# Patient Record
Sex: Male | Born: 1970 | Hispanic: No | Smoking: Never smoker
Health system: Southern US, Community
[De-identification: ages and names within clinical notes are randomized; demographics above are authoritative.]

---

## 2019-04-08 ENCOUNTER — Emergency Department (HOSPITAL_COMMUNITY): Payer: BC Managed Care – PPO

## 2019-04-08 ENCOUNTER — Other Ambulatory Visit: Payer: Self-pay

## 2019-04-08 ENCOUNTER — Encounter (HOSPITAL_COMMUNITY): Payer: Self-pay

## 2019-04-08 ENCOUNTER — Inpatient Hospital Stay (HOSPITAL_COMMUNITY)
Admission: EM | Admit: 2019-04-08 | Discharge: 2019-04-30 | DRG: 207 | Disposition: A | Discharge status: Home Health | Payer: BC Managed Care – PPO | Attending: Internal Medicine | Admitting: Internal Medicine

## 2019-04-08 DIAGNOSIS — E870 Hyperosmolality and hypernatremia: Secondary | ICD-10-CM | POA: Diagnosis not present

## 2019-04-08 DIAGNOSIS — R001 Bradycardia, unspecified: Secondary | ICD-10-CM | POA: Diagnosis present

## 2019-04-08 DIAGNOSIS — J80 Acute respiratory distress syndrome: Secondary | ICD-10-CM | POA: Diagnosis not present

## 2019-04-08 DIAGNOSIS — Z4659 Encounter for fitting and adjustment of other gastrointestinal appliance and device: Secondary | ICD-10-CM

## 2019-04-08 DIAGNOSIS — R739 Hyperglycemia, unspecified: Secondary | ICD-10-CM | POA: Diagnosis present

## 2019-04-08 DIAGNOSIS — U071 COVID-19: Secondary | ICD-10-CM

## 2019-04-08 DIAGNOSIS — J069 Acute upper respiratory infection, unspecified: Secondary | ICD-10-CM

## 2019-04-08 DIAGNOSIS — J988 Other specified respiratory disorders: Secondary | ICD-10-CM | POA: Diagnosis not present

## 2019-04-08 DIAGNOSIS — Z9289 Personal history of other medical treatment: Secondary | ICD-10-CM

## 2019-04-08 DIAGNOSIS — R339 Retention of urine, unspecified: Secondary | ICD-10-CM | POA: Diagnosis not present

## 2019-04-08 DIAGNOSIS — J9601 Acute respiratory failure with hypoxia: Secondary | ICD-10-CM | POA: Diagnosis not present

## 2019-04-08 DIAGNOSIS — J1289 Other viral pneumonia: Secondary | ICD-10-CM | POA: Diagnosis present

## 2019-04-08 DIAGNOSIS — I480 Paroxysmal atrial fibrillation: Secondary | ICD-10-CM | POA: Diagnosis present

## 2019-04-08 DIAGNOSIS — J1282 Pneumonia due to coronavirus disease 2019: Secondary | ICD-10-CM

## 2019-04-08 DIAGNOSIS — R740 Nonspecific elevation of levels of transaminase and lactic acid dehydrogenase [LDH]: Secondary | ICD-10-CM | POA: Diagnosis present

## 2019-04-08 DIAGNOSIS — E876 Hypokalemia: Secondary | ICD-10-CM | POA: Diagnosis present

## 2019-04-08 DIAGNOSIS — I4891 Unspecified atrial fibrillation: Secondary | ICD-10-CM | POA: Diagnosis not present

## 2019-04-08 DIAGNOSIS — R945 Abnormal results of liver function studies: Secondary | ICD-10-CM | POA: Diagnosis present

## 2019-04-08 DIAGNOSIS — E871 Hypo-osmolality and hyponatremia: Secondary | ICD-10-CM | POA: Diagnosis not present

## 2019-04-08 DIAGNOSIS — J9621 Acute and chronic respiratory failure with hypoxia: Secondary | ICD-10-CM | POA: Diagnosis not present

## 2019-04-08 DIAGNOSIS — I4819 Other persistent atrial fibrillation: Secondary | ICD-10-CM | POA: Diagnosis not present

## 2019-04-08 DIAGNOSIS — Z978 Presence of other specified devices: Secondary | ICD-10-CM | POA: Diagnosis not present

## 2019-04-08 HISTORY — DX: COVID-19: U07.1

## 2019-04-08 LAB — COMPREHENSIVE METABOLIC PANEL
ALT: 34 U/L (ref 0–44)
AST: 69 U/L — ABNORMAL HIGH (ref 15–41)
Albumin: 3.3 g/dL — ABNORMAL LOW (ref 3.5–5.0)
Alkaline Phosphatase: 59 U/L (ref 38–126)
Anion gap: 11 (ref 5–15)
BUN: 14 mg/dL (ref 6–20)
CO2: 28 mmol/L (ref 22–32)
Calcium: 8.9 mg/dL (ref 8.9–10.3)
Chloride: 102 mmol/L (ref 98–111)
Creatinine, Ser: 1.42 mg/dL — ABNORMAL HIGH (ref 0.61–1.24)
GFR calc Af Amer: >60 mL/min (ref >60)
GFR calc non Af Amer: 57 mL/min — ABNORMAL LOW (ref >60)
Glucose, Bld: 129 mg/dL — ABNORMAL HIGH (ref 70–99)
Potassium: 3.4 mmol/L — ABNORMAL LOW (ref 3.5–5.1)
Sodium: 141 mmol/L (ref 135–145)
Total Bilirubin: 0.6 mg/dL (ref 0.3–1.2)
Total Protein: 7.6 g/dL (ref 6.5–8.1)

## 2019-04-08 LAB — FIBRINOGEN: Fibrinogen: 628 mg/dL — ABNORMAL HIGH (ref 210–475)

## 2019-04-08 LAB — CBC
HCT: 46.6 % (ref 39.0–52.0)
Hemoglobin: 15.1 g/dL (ref 13.0–17.0)
MCH: 26.5 pg (ref 26.0–34.0)
MCHC: 32.4 g/dL (ref 30.0–36.0)
MCV: 81.9 fL (ref 80.0–100.0)
Platelets: 87 10*3/uL — ABNORMAL LOW (ref 150–400)
RBC: 5.69 MIL/uL (ref 4.22–5.81)
RDW: 13.7 % (ref 11.5–15.5)
WBC: 7.3 10*3/uL (ref 4.0–10.5)
nRBC: 0 % (ref 0.0–0.2)

## 2019-04-08 LAB — LACTATE DEHYDROGENASE: LDH: 349 U/L — ABNORMAL HIGH (ref 98–192)

## 2019-04-08 LAB — SARS CORONAVIRUS 2 BY RT PCR (HOSPITAL ORDER, PERFORMED IN ~~LOC~~ HOSPITAL LAB): SARS Coronavirus 2: POSITIVE — AB

## 2019-04-08 LAB — BRAIN NATRIURETIC PEPTIDE: B Natriuretic Peptide: 25.4 pg/mL (ref 0.0–100.0)

## 2019-04-08 LAB — PROCALCITONIN: Procalcitonin: 1.9 ng/mL

## 2019-04-08 LAB — LIPASE, BLOOD: Lipase: 21 U/L (ref 11–51)

## 2019-04-08 LAB — C-REACTIVE PROTEIN: CRP: 11.8 mg/dL — ABNORMAL HIGH (ref <1.0)

## 2019-04-08 LAB — LACTIC ACID, PLASMA: Lactic Acid, Venous: 1.1 mmol/L (ref 0.5–1.9)

## 2019-04-08 LAB — D-DIMER, QUANTITATIVE: D-Dimer, Quant: 0.57 ug/mL-FEU — ABNORMAL HIGH (ref 0.00–0.50)

## 2019-04-08 LAB — FERRITIN: Ferritin: 805 ng/mL — ABNORMAL HIGH (ref 24–336)

## 2019-04-08 MED ORDER — ONDANSETRON HCL 4 MG/2ML IJ SOLN
4.0000 mg | Freq: Once | INTRAMUSCULAR | Status: AC
  Administered 2019-04-08: 4 mg via INTRAVENOUS
  Filled 2019-04-08: qty 2

## 2019-04-08 MED ORDER — ONDANSETRON HCL 4 MG/2ML IJ SOLN: 4.0000 mg | Freq: Four times a day (QID) | INTRAMUSCULAR | Status: DC | PRN

## 2019-04-08 MED ORDER — SODIUM CHLORIDE 0.9% FLUSH
3.0000 mL | Freq: Two times a day (BID) | INTRAVENOUS | Status: DC
  Administered 2019-04-09 – 2019-04-10 (×3): 3 mL via INTRAVENOUS
  Administered 2019-04-11: 10 mL via INTRAVENOUS
  Administered 2019-04-11: 16:00:00 via INTRAVENOUS
  Administered 2019-04-12 – 2019-04-30 (×36): 3 mL via INTRAVENOUS

## 2019-04-08 MED ORDER — ENSURE ENLIVE PO LIQD
237.0000 mL | Freq: Two times a day (BID) | ORAL | Status: DC
  Administered 2019-04-09: 237 mL via ORAL
  Filled 2019-04-08 (×3): qty 237

## 2019-04-08 MED ORDER — BISACODYL 5 MG PO TBEC: 5.0000 mg | DELAYED_RELEASE_TABLET | Freq: Every day | ORAL | Status: DC | PRN

## 2019-04-08 MED ORDER — ACETAMINOPHEN 325 MG PO TABS
650.0000 mg | ORAL_TABLET | Freq: Four times a day (QID) | ORAL | Status: DC | PRN
  Administered 2019-04-08 – 2019-04-10 (×5): 650 mg via ORAL
  Filled 2019-04-08 (×6): qty 2

## 2019-04-08 MED ORDER — SODIUM CHLORIDE 0.9 % IV BOLUS
1000.0000 mL | Freq: Once | INTRAVENOUS | Status: AC
  Administered 2019-04-08: 1000 mL via INTRAVENOUS

## 2019-04-08 MED ORDER — ALBUTEROL SULFATE HFA 108 (90 BASE) MCG/ACT IN AERS
4.0000 | INHALATION_SPRAY | Freq: Once | RESPIRATORY_TRACT | Status: AC
  Administered 2019-04-08: 4 via RESPIRATORY_TRACT
  Filled 2019-04-08: qty 6.7

## 2019-04-08 MED ORDER — POLYETHYLENE GLYCOL 3350 17 G PO PACK: 17.0000 g | PACK | Freq: Every day | ORAL | Status: DC | PRN

## 2019-04-08 MED ORDER — ACETAMINOPHEN 325 MG PO TABS: 650.0000 mg | ORAL_TABLET | Freq: Once | ORAL | Status: DC

## 2019-04-08 MED ORDER — SODIUM CHLORIDE 0.9 % IV SOLN
500.0000 mg | Freq: Once | INTRAVENOUS | Status: DC
  Filled 2019-04-08: qty 500

## 2019-04-08 MED ORDER — ACETAMINOPHEN 325 MG PO TABS
650.0000 mg | ORAL_TABLET | Freq: Once | ORAL | Status: AC
  Administered 2019-04-08: 650 mg via ORAL
  Filled 2019-04-08: qty 2

## 2019-04-08 MED ORDER — OXYCODONE HCL 5 MG PO TABS
5.0000 mg | ORAL_TABLET | ORAL | Status: DC | PRN
  Administered 2019-04-16: 05:00:00 5 mg via ORAL
  Filled 2019-04-08: qty 1

## 2019-04-08 MED ORDER — SODIUM CHLORIDE 0.9 % IV SOLN
1.0000 g | Freq: Once | INTRAVENOUS | Status: AC
  Administered 2019-04-08: 13:00:00 1 g via INTRAVENOUS
  Filled 2019-04-08: qty 10

## 2019-04-08 MED ORDER — ONDANSETRON HCL 4 MG PO TABS: 4.0000 mg | ORAL_TABLET | Freq: Four times a day (QID) | ORAL | Status: DC | PRN

## 2019-04-08 MED ORDER — DOCUSATE SODIUM 100 MG PO CAPS
100.0000 mg | ORAL_CAPSULE | Freq: Two times a day (BID) | ORAL | Status: DC
  Administered 2019-04-08 – 2019-04-10 (×4): 100 mg via ORAL
  Filled 2019-04-08 (×4): qty 1

## 2019-04-08 MED ORDER — ENOXAPARIN SODIUM 40 MG/0.4ML ~~LOC~~ SOLN
40.0000 mg | SUBCUTANEOUS | Status: DC
  Administered 2019-04-08 – 2019-04-10 (×3): 40 mg via SUBCUTANEOUS
  Filled 2019-04-08 (×3): qty 0.4

## 2019-04-08 NOTE — ED Notes (Signed)
Attempted report. Staff notified that the pt is on the way and is being transported by [facility]. RN will call back.

## 2019-04-08 NOTE — ED Notes (Signed)
Speaking with interpretor , pt states there are 8 other people in the home that have been sick. Spoke with daughter on phone-- speaks English-- explained the necessity of staying home and self quarantining -- states that her sister has gone to work. Explained that she needed to stay home too.

## 2019-04-08 NOTE — ED Notes (Signed)
ALl information gathered from patient using vietnamese interpreter.

## 2019-04-08 NOTE — ED Notes (Signed)
ED TO INPATIENT HANDOFF REPORT  ED Nurse Name and Phone #:  Patty [phone]  S Name/Age/Gender Melvin Mills 48 y.o. male Room/Bed: 033C/033C  Code Status   Code Status: Full Code  Home/SNF/Other Home Patient oriented to: self, place, time and situation Is this baseline? Yes   Triage Complete: Triage complete  Chief Complaint Weakness  Triage Note Pt endorses chills, bodyaches, n/v, fatigue, cough x 3 days. Another family member in the house has the same. 99.3 oral temp. VSS   Allergies No Known Allergies  Level of Care/Admitting Diagnosis ED Disposition    ED Disposition Condition Comment   Admit  [facility] [[id number]]  Level of Care: Telemetry [5]  Covid Evaluation: Confirmed COVID Positive  Isolation Risk Level: Low Risk/Droplet (Less than 4L [place] supplementation)  Diagnosis: Acute respiratory disease due to COVID-19 virus [[id number]][[phone]]  Admitting Physician: Jonah BlueYATES, JENNIFER [[id number]]  Attending Physician: Jonah BlueYATES, JENNIFER [[id number]]  Estimated length of stay: past midnight tomorrow  Certification:: I certify this patient will need inpatient services for at least 2 midnights  PT Class (Do Not Modify): Inpatient [101]  PT Acc Code (Do Not Modify): Private [1]       B Medical/Surgery History History reviewed. No pertinent past medical history. History reviewed. No pertinent surgical history.   A IV Location/Drains/Wounds Patient Lines/Drains/Airways Status   Active Line/Drains/Airways    Name:   Placement date:   Placement time:   Site:   Days:   Peripheral IV 04/08/19 Left Antecubital   04/08/19    -    Antecubital   less than 1          Intake/Output Last 24 hours  Intake/Output Summary (Last 24 hours) at 04/08/2019 1556 Last data filed at 04/08/2019 1449 Gross per 24 hour  Intake 1100 ml  Output -  Net 1100 ml    Labs/Imaging Results for orders placed or performed during the hospital encounter of 04/08/19 (from the past 48  hour(s))  Lipase, blood     Status: None   Collection Time: 04/08/19  9:26 AM  Result Value Ref Range   Lipase 21 11 - 51 U/L    Comment: Performed at [facility], [address] [place], [place] [address]  Comprehensive metabolic panel     Status: Abnormal   Collection Time: 04/08/19  9:26 AM  Result Value Ref Range   Sodium 141 135 - 145 mmol/L   Potassium 3.4 (L) 3.5 - 5.1 mmol/L   Chloride 102 98 - 111 mmol/L   CO2 28 22 - 32 mmol/L   Glucose, Bld 129 (H) 70 - 99 mg/dL   BUN 14 6 - 20 mg/dL   Creatinine, Ser 2.531.42 (H) 0.61 - 1.24 mg/dL   Calcium 8.9 8.9 - 66.410.3 mg/dL   Total Protein 7.6 6.5 - 8.1 g/dL   Albumin 3.3 (L) 3.5 - 5.0 g/dL   AST 69 (H) 15 - 41 U/L   ALT 34 0 - 44 U/L   Alkaline Phosphatase 59 38 - 126 U/L   Total Bilirubin 0.6 0.3 - 1.2 mg/dL   GFR calc non Af Amer 57 (L) >60 mL/min   GFR calc Af Amer >60 >60 mL/min   Anion gap 11 5 - 15    Comment: Performed at [facility], [address] [place], [place] [address]  CBC     Status: Abnormal   Collection Time: 04/08/19  9:26 AM  Result Value Ref Range  WBC 7.3 4.0 - 10.5 K/uL   RBC 5.69 4.22 - 5.81 MIL/uL   Hemoglobin 15.1 13.0 - 17.0 g/dL   HCT 81.1 91.4 - 78.2 %   MCV 81.9 80.0 - 100.0 fL   MCH 26.5 26.0 - 34.0 pg   MCHC 32.4 30.0 - 36.0 g/dL   RDW 95.6 21.3 - 08.6 %   Platelets 87 (L) 150 - 400 K/uL    Comment: REPEATED TO VERIFY PLATELET COUNT CONFIRMED BY SMEAR SPECIMEN CHECKED FOR CLOTS Immature Platelet Fraction may be clinically indicated, consider ordering this additional test [id number]    nRBC 0.0 0.0 - 0.2 %    Comment: Performed at [facility], [address], [place], [place] [address]  SARS Coronavirus 2 (CEPHEID- Performed in [facility] hospital lab), Hosp Order     Status: Abnormal   Collection Time: 04/08/19 10:23 AM  Result Value Ref Range   SARS Coronavirus 2 POSITIVE (A) NEGATIVE    Comment: RESULT CALLED TO, READ BACK BY AND VERIFIED WITH: Duanne Guess RN  12:10 04/08/19 ([email or web address]) (NOTE) If result is NEGATIVE SARS-CoV-2 target nucleic acids are NOT DETECTED. The SARS-CoV-2 RNA is generally detectable in upper and lower  respiratory specimens during the acute phase of infection. The lowest  concentration of SARS-CoV-2 viral copies this assay can detect is 250  copies / mL. A negative result does not preclude SARS-CoV-2 infection  and should not be used as the sole basis for treatment or other  patient management decisions.  A negative result may occur with  improper specimen collection / handling, submission of specimen other  than nasopharyngeal swab, presence of viral mutation(s) within the  areas targeted by this assay, and inadequate number of viral copies  (<250 copies / mL). A negative result must be combined with clinical  observations, patient history, and epidemiological information. If result is POSITIVE SARS-CoV-2 target nucleic acids are DETECTED.  The SARS-CoV-2 RNA is generally detectable in upper and lower  respiratory specimens during the acute phase of infection.  Positive  results are indicative of active infection with SARS-CoV-2.  Clinical  correlation with patient history and other diagnostic information is  necessary to determine patient infection status.  Positive results do  not rule out bacterial infection or co-infection with other viruses. If result is PRESUMPTIVE POSTIVE SARS-CoV-2 nucleic acids MAY BE PRESENT.   A presumptive positive result was obtained on the submitted specimen  and confirmed on repeat testing.  While 2019 novel coronavirus  (SARS-CoV-2) nucleic acids may be present in the submitted sample  additional confirmatory testing may be necessary for epidemiological  and / or clinical management purposes  to differentiate between  SARS-CoV-2 and other Sarbecovirus currently known to infect humans.  If clinically indicated additional testing with an alternate test  methodology [phone])  is  advised. The SARS-CoV-2 RNA is generally  detectable in upper and lower respiratory specimens during the acute  phase of infection. The expected result is Negative. Fact Sheet for Patients:  BoilerBrush.com.cy Fact Sheet for Healthcare Providers: https://pope.com/ This test is not yet approved or cleared by the [place] FDA and has been authorized for detection and/or diagnosis of SARS-CoV-2 by FDA under an Emergency Use Authorization (EUA).  This EUA will remain in effect (meaning this test can be used) for the duration of the COVID-19 declaration under Section 564(b)(1) of the Act, 21 U.S.C. section 360bbb-3(b)(1), unless the authorization is terminated or revoked sooner. Performed at [facility], [address], [place], [place] [address]   Lactic acid, plasma     Status: None   Collection Time: 04/08/19 12:35 PM  Result Value Ref Range   Lactic Acid, Venous 1.1 0.5 - 1.9 mmol/L    Comment: Performed at [facility], [address] [place], [place] [address]   Dg Chest Portable 1 View  Result Date: 04/08/2019 CLINICAL DATA:  Cough, weakness EXAM: PORTABLE CHEST 1 VIEW COMPARISON:  None. FINDINGS: Heart is borderline in size. There are patchy bilateral airspace opacities noted, left greater than right. No effusions. No acute bony abnormality. IMPRESSION: Patchy bilateral airspace disease, left greater than right. Findings concerning for pneumonia. Atypical/viral pneumonia is possible. Electronically Signed   By: Charlett Nose M.D.   On: 04/08/2019 09:56    Pending Labs Unresulted Labs (From admission, onward)    Start     Ordered   04/09/19 0500  CBC with Differential/Platelet  Daily,   R     04/08/19 1526   04/09/19 0500  Comprehensive metabolic panel  Daily,   R     04/08/19 1526   04/09/19 0500  C-reactive protein  Daily,   R     04/08/19 1526   04/09/19 0500  D-dimer, quantitative (not at [facility])  Daily,   R      04/08/19 1526   04/09/19 0500  Ferritin  Daily,   R     04/08/19 1526   04/08/19 1527  Brain natriuretic peptide  Once,   R     04/08/19 1526   04/08/19 1527  C-reactive protein  Once,   R     04/08/19 1526   04/08/19 1527  D-dimer, quantitative (not at [facility])  Once,   R     04/08/19 1526   04/08/19 1527  Ferritin  Once,   R     04/08/19 1526   04/08/19 1527  Fibrinogen  Once,   R     04/08/19 1526   04/08/19 1527  Procalcitonin  Once,   R     04/08/19 1526   04/08/19 1527  Lactate dehydrogenase  Once,   R     04/08/19 1526   04/08/19 1526  HIV antibody (Routine Testing)  Once,   R     04/08/19 1526   04/08/19 1235  Lactic acid, plasma  Now then every 2 hours,   STAT     04/08/19 1234   04/08/19 1115  Blood culture (routine x 2)  BLOOD CULTURE X 2,   STAT     04/08/19 1114          Vitals/Pain Today's Vitals   04/08/19 1045 04/08/19 1100 04/08/19 1115 04/08/19 1336  BP: 122/86 120/84 114/85 (!) 137/96  Pulse: 78 78 74 81  Resp: (!) 29 18 (!) 27 (!) 26  Temp:    (S) 100.3 F (37.9 C)  TempSrc:      SpO2: 96% 95% 96% 99%  PainSc:        Isolation Precautions Droplet and Contact precautions  Medications Medications  acetaminophen (TYLENOL) tablet 650 mg (has no administration in time range)  enoxaparin (LOVENOX) injection 40 mg (has no administration in time range)  acetaminophen (TYLENOL) tablet 650 mg (has no administration in time range)  oxyCODONE (Oxy IR/ROXICODONE) immediate release tablet 5 mg (has no administration in time range)  docusate sodium (COLACE) capsule 100 mg (has no administration in time range)  polyethylene glycol (MIRALAX / GLYCOLAX) packet 17 g (has no  administration in time range)  bisacodyl (DULCOLAX) EC tablet 5 mg (has no administration in time range)  ondansetron (ZOFRAN) tablet 4 mg (has no administration in time range)    Or  ondansetron (ZOFRAN) injection 4 mg (has no administration in time range)  sodium chloride flush (NS) 0.9 %  injection 3 mL (has no administration in time range)  acetaminophen (TYLENOL) tablet 650 mg (650 mg Oral Given 04/08/19 1000)  sodium chloride 0.9 % bolus 1,000 mL (0 mLs Intravenous Stopped 04/08/19 1056)  albuterol (VENTOLIN HFA) 108 (90 Base) MCG/ACT inhaler 4 puff (4 puffs Inhalation Given 04/08/19 1000)  ondansetron (ZOFRAN) injection 4 mg (4 mg Intravenous Given 04/08/19 0959)  cefTRIAXone (ROCEPHIN) 1 g in sodium chloride 0.9 % 100 mL IVPB (0 g Intravenous Stopped 04/08/19 1449)    Mobility walks Low fall risk   Focused Assessments    R Recommendations: See Admitting Provider Note  Report given to:   Additional Notes:

## 2019-04-08 NOTE — ED Provider Notes (Signed)
[facility] EMERGENCY DEPARTMENT Provider Note   CSN: [id number] Arrival date & time: 04/08/19  0909    History   Chief Complaint Chief Complaint  Patient presents with  . Chills  . Fever  . Emesis  . Cough    [name] interpreter was used throughout this evaluation.  HPI Melvin Mills is a 48 y.o. male.     HPI  Patient is a 48 year old male with no known past medical history presents the emergency department today for evaluation of a cough with yellow sputum production has been present for the last 3 to 4 days.  He also complains of nausea, vomiting, diarrhea and abdominal pain.  States his abdominal pain was located diffusely to the abdomen, it has since resolved.  He is also complaining of fevers and fatigue.  He denies any shortness of breath or chest pain.  Denies dysuria but does report frequency.  States that he has a rash to his abdomen and back that was caused by "coining". Pt states he rubbed coin to his body over the places that were painful to him and then applied an ointment which left the rash.  Denies rhinorrhea, congestion or sore throat.  Denies any known covert exposures but does state that he has a family member in his household who was sick with similar symptoms.  History reviewed. No pertinent past medical history.  There are no active problems to display for this patient.   History reviewed. No pertinent surgical history.      Home Medications    Prior to Admission medications   Not on File    Family History History reviewed. No pertinent family history.  Social History Social History   Tobacco Use  . Smoking status: Not on file  Substance Use Topics  . Alcohol use: Never    Frequency: Never  . Drug use: Never     Allergies   Patient has no known allergies.   Review of Systems Review of Systems  Constitutional: Positive for appetite change, chills, diaphoresis and fever.  HENT: Negative for congestion,  ear pain, rhinorrhea and sore throat.   Eyes: Negative for visual disturbance.  Respiratory: Positive for cough. Negative for shortness of breath.   Cardiovascular: Negative for chest pain and leg swelling.  Gastrointestinal: Positive for abdominal pain, diarrhea, nausea and vomiting. Negative for blood in stool and constipation.  Genitourinary: Positive for frequency. Negative for dysuria and hematuria.  Musculoskeletal: Negative for back pain.  Skin: Negative for color change and rash.  Neurological: Positive for weakness (generalized). Negative for headaches.  All other systems reviewed and are negative.    Physical Exam Updated Vital Signs BP (!) 137/96   Pulse 81   Temp (S) 100.3 F (37.9 C)   Resp (!) 26   SpO2 99%   Physical Exam Vitals signs and nursing note reviewed.  Constitutional:      Appearance: He is well-developed.  HENT:     Head: Normocephalic and atraumatic.  Eyes:     Conjunctiva/sclera: Conjunctivae normal.  Neck:     Musculoskeletal: Neck supple.  Cardiovascular:     Rate and Rhythm: Normal rate and regular rhythm.     Heart sounds: Normal heart sounds. No murmur.  Pulmonary:     Effort: Pulmonary effort is normal.     Breath sounds: Examination of the right-middle field reveals rales. Examination of the left-middle field reveals rales. Examination of the right-lower field reveals rales. Examination of the left-lower field reveals rales.  Decreased breath sounds and rales present. No wheezing or rhonchi.     Comments: Tachypneic, but speaking in full sentences Abdominal:     General: Bowel sounds are normal. There is no distension.     Palpations: Abdomen is soft.     Tenderness: There is no abdominal tenderness. There is no guarding or rebound.  Skin:    General: Skin is warm and dry.     Comments: petechiael rash to upper abdomen, and to the back  Neurological:     Mental Status: He is alert.     ED Treatments / Results  Labs (all labs  ordered are listed, but only abnormal results are displayed) Labs Reviewed  SARS CORONAVIRUS 2 (HOSPITAL ORDER, PERFORMED IN [facility]) - Abnormal; Notable for the following components:      Result Value   SARS Coronavirus 2 POSITIVE (*)    All other components within normal limits  COMPREHENSIVE METABOLIC PANEL - Abnormal; Notable for the following components:   Potassium 3.4 (*)    Glucose, Bld 129 (*)    Creatinine, Ser 1.42 (*)    Albumin 3.3 (*)    AST 69 (*)    GFR calc non Af Amer 57 (*)    All other components within normal limits  CBC - Abnormal; Notable for the following components:   Platelets 87 (*)    All other components within normal limits  CULTURE, BLOOD (ROUTINE X 2)  CULTURE, BLOOD (ROUTINE X 2)  LIPASE, BLOOD  LACTIC ACID, PLASMA  URINALYSIS, ROUTINE W REFLEX MICROSCOPIC  LACTIC ACID, PLASMA    EKG EKG Interpretation  Date/Time:  Thursday Apr 08 2019 09:11:21 EDT Ventricular Rate:  75 PR Interval:    QRS Duration: 101 QT Interval:  371 QTC Calculation: 415 R Axis:   69 Text Interpretation:  Sinus rhythm Probable left atrial enlargement Borderline repolarization abnormality Confirmed by Blane OharaZavitz, Joshua [id number]([id number]) on 04/08/2019 9:20:07 AM   Radiology Dg Chest Portable 1 View  Result Date: 04/08/2019 CLINICAL DATA:  Cough, weakness EXAM: PORTABLE CHEST 1 VIEW COMPARISON:  None. FINDINGS: Heart is borderline in size. There are patchy bilateral airspace opacities noted, left greater than right. No effusions. No acute bony abnormality. IMPRESSION: Patchy bilateral airspace disease, left greater than right. Findings concerning for pneumonia. Atypical/viral pneumonia is possible. Electronically Signed   By: Charlett NoseKevin  Dover M.D.   On: 04/08/2019 09:56    Procedures Procedures (including critical care time)  Medications Ordered in ED Medications  azithromycin (ZITHROMAX) 500 mg in sodium chloride 0.9 % 250 mL IVPB (has no administration in time range)   acetaminophen (TYLENOL) tablet 650 mg (has no administration in time range)  acetaminophen (TYLENOL) tablet 650 mg (650 mg Oral Given 04/08/19 1000)  sodium chloride 0.9 % bolus 1,000 mL (0 mLs Intravenous Stopped 04/08/19 1056)  albuterol (VENTOLIN HFA) 108 (90 Base) MCG/ACT inhaler 4 puff (4 puffs Inhalation Given 04/08/19 1000)  ondansetron (ZOFRAN) injection 4 mg (4 mg Intravenous Given 04/08/19 0959)  cefTRIAXone (ROCEPHIN) 1 g in sodium chloride 0.9 % 100 mL IVPB (1 g Intravenous New Bag/Given 04/08/19 1316)     Initial Impression / Assessment and Plan / ED Course  I have reviewed the triage vital signs and the nursing notes.  Pertinent labs & imaging results that were available during my care of the patient were reviewed by me and considered in my medical decision making (see chart for details).   Final Clinical Impressions(s) / ED Diagnoses  Final diagnoses:  COVID-19   Patient is a 48 year old male with no known past medical history presents the emergency department today for evaluation of a productive cough, fever, fatigue, NVD, and abd pain (resolved) x3 days. No CP or SOB. Has sick contacts at home with similar sxs.  Borderline temp on arrival. Tachypneic, but maintaining sats. Remainder of VS are WNL  Abdomen soft and nontender. Lungs with faint crackles to the bilat lower to mid lung fields.  Heart with RRR.  CBC is without leukocytosis, does have some thrombocytopenia. CMP mild hypokalemia.  Elevated creatinine of 1.42, no prior labs to compare.  Albumin is slightly low at 3.3.  AST is slightly elevated at 69 also no prior for comparison but could be related to positive COVID testing. Lipase negative Lactic acid is negative Cultures obtained UA is pending at the time of admission.  EKG with NSR, Probable left atrial enlargement,  Borderline repolarization abnormality   CXR with patchy bilateral airspace disease, left greater than right. Findings concerning for  pneumonia. Atypical/viral pneumonia is possible. -- Pt given a dose of ceftriaxone and azithromycin. Will add COVID testing.    COVID testing is positive.  Pt given IVF, antiemetics, albuterol. On reassessment patient states that he still feels very tired and does not feel any better.  He was placed on 2 L nasal cannula as he is still very tachypneic in the 30s on the monitor.  Given his tachypnea and obvious discomfort, will plan for admission for observation.  1:47 PM CONSULT with Dr. Ophelia Charter who accept pt for admission.   ED Discharge Orders    None       Rayne Du 04/08/19 1348    Blane Ohara, MD 04/08/19 1546

## 2019-04-08 NOTE — ED Notes (Signed)
This RN spoke with PA Courtni and asked if blood cultures are needed prior to antibiotics and she said yes. She gave verbal order for blood cultures.

## 2019-04-08 NOTE — ED Notes (Signed)
[phone] Daughter Hmit Vicki Mallet would like to be updated with patients status

## 2019-04-08 NOTE — ED Notes (Signed)
Lab called pt is positive for COVID

## 2019-04-08 NOTE — H&P (Signed)
History and Physical    Melvin Mills JFH:[id number] DOB: Sep 22, 1971 DOA: 04/08/2019  PCP: Patient, No Pcp Per  Patient coming from: Home - lives with 7 additional family members of various ages who are also showing evidence of illness; NOK: Daughter, [phone]  Chief Complaint: Fever  HPI: Melvin Wilmes is a 48 y.o. male with no known medical history presenting with fever.  He reports several days of fever, SOB, chills, cough productive of yellowish sputum, and generalized malaise.  He has multiple sick contacts within his household.  History was obtained through the mobile translator, although he later reported that he needs a Manufacturing engineer (rather than [name]   ED Course:  "Soft" admit with COVID - not hypoxic but looks ill and tachypnea with RR in 30s.  Placed on  O2.  Likely needs observation.  Received a dose of Rocephin and Azithromycin.  Review of Systems: As per HPI; otherwise review of systems reviewed and negative.   Ambulatory Status:   Ambulates without assistance  Past Medical History:  Diagnosis Date  . COVID-19 virus infection 04/08/2019    History reviewed. No pertinent surgical history.  Social History   Socioeconomic History  . Marital status: Unknown    Spouse name: Not on file  . Number of children: Not on file  . Years of education: Not on file  . Highest education level: Not on file  Occupational History  . Not on file  Social Needs  . Financial resource strain: Not on file  . Food insecurity:    Worry: Not on file    Inability: Not on file  . Transportation needs:    Medical: Not on file    Non-medical: Not on file  Tobacco Use  . Smoking status: Never Smoker  . Smokeless tobacco: Never Used  Substance and Sexual Activity  . Alcohol use: Never    Frequency: Never  . Drug use: Never  . Sexual activity: Not on file  Lifestyle  . Physical activity:    Days per week: Not on file    Minutes per session: Not on file  .  Stress: Not on file  Relationships  . Social connections:    Talks on phone: Not on file    Gets together: Not on file    Attends religious service: Not on file    Active member of club or organization: Not on file    Attends meetings of clubs or organizations: Not on file    Relationship status: Not on file  . Intimate partner violence:    Fear of current or ex partner: Not on file    Emotionally abused: Not on file    Physically abused: Not on file    Forced sexual activity: Not on file  Other Topics Concern  . Not on file  Social History Narrative  . Not on file    No Known Allergies  History reviewed. No pertinent family history.  Prior to Admission medications   Not on File    Physical Exam: Vitals:   04/08/19 1545 04/08/19 1645 04/08/19 1706 04/08/19 1726  BP:  (!) 130/92    Pulse:  93  87  Resp: (!) 21   (!) 33  Temp:  (!) 103.2 F (39.6 C)    TempSrc:  Oral    SpO2:    96%  Weight:   75 kg   Height:   5\' 3"  (1.6 m)      . General:  Appears calm and comfortable  and is NAD but does appear mildly ill . Eyes:  PERRL, EOMI, normal lids, iris . ENT:  grossly normal hearing, lips & tongue, mmm . Neck:  no LAD, masses or thyromegaly . Cardiovascular:  RRR, no m/r/g.   . Respiratory:   Diffuse rales with tachypnea up to 40s.  Significantly winded just from sitting forward to allow auscultation. . Abdomen:  soft, NT, ND, NABS . Back:   normal alignment, no CVAT . Skin:  no rash or induration seen on limited exam . Musculoskeletal:  grossly normal tone BUE/BLE, good ROM, no bony abnormality . Psychiatric:  grossly normal mood and affect, speech fluent and appropriate, AOx3 . Neurologic:  CN 2-12 grossly intact, moves all extremities in coordinated fashion, sensation intact    Radiological Exams on Admission: Dg Chest Portable 1 View  Result Date: 04/08/2019 CLINICAL DATA:  Cough, weakness EXAM: PORTABLE CHEST 1 VIEW COMPARISON:  None. FINDINGS: Heart is  borderline in size. There are patchy bilateral airspace opacities noted, left greater than right. No effusions. No acute bony abnormality. IMPRESSION: Patchy bilateral airspace disease, left greater than right. Findings concerning for pneumonia. Atypical/viral pneumonia is possible. Electronically Signed   By: Charlett Nose M.D.   On: 04/08/2019 09:56    EKG: Independently reviewed.  NSR with rate 75; nonspecific ST changes with no evidence of acute ischemia   Labs on Admission: I have personally reviewed the available labs and imaging studies at the time of the admission.  Pertinent labs:   Glucose 129 BUN 14/Creatinine 1.42/GFR 57 Albumin 3.3 AST 69/ALT 34 WBC 7.3 Platelets 87 Lactate 1.1 COVID POSITIVE Blood culture x 1 pending  Assessment/Plan Active Problems:   Acute respiratory disease due to COVID-19 virus   Acute respiratory failure with tachypnea -Patient with presenting with SOB and fever; also with cough productive of yellow sputum -COVID POSITIVE with multiple people in his household also ill -He is not currently hypoxic but had tachypnea up to the 40s -The patient does not have known comorbidities which may increase the risk for ARDS/MODS -Exam is concerning for development of ARDS/MODS due to respiratory distress -Pertinent labs concerning for COVID include normal WBC count; increased BUN/Creatinine; increased LFTs -Other COVID labs are pending -CXR with patchy bilateral airspace disease, left greater than right. -He was given Rocephin x 1 in the ER and will need ongoing antibiotics if procalcitonin is >0.10 -Will admit for further evaluation, close monitoring, and treatment -Monitor on telemetry x at least 24 hours -At this time, will attempt to avoid use of aerosolized medications and use HFAs instead -Will check daily labs including BMP; LFTs; CBC with differential;  CRP; ferritin; fibrinogen; D-dimer); LDH -Will ask the patient to maintain an awake prone  position for 16+ hours a day, if possible, with a minimum of 2-3 hours at a time -If the patient shows clinical deterioration, consider Solumedrol 40-80 mg IV q8h x 3 days and possibly transfer to ICU with PCCM consultation -Possible therapeutic interventions include hydroxychloroquine; IL-6 inhibitor (Tocilizumab, 8 mg/kg IV x 1 dose); and possibly Azithromycin, vitamin C, zinc -Will attempt to maintain euvolemia to a net negative fluid status -If D-dimer <5, will use standard-dosed Lovenox for DVT prevention -If D-dimer >5, will use weight-based Lovenox prophylaxis at this time (0.5 mg/kg q12h) -Patient was seen wearing full PPE including: gown, gloves, head cover, N95, and face shield; donning and doffing was in compliance with current standards   DVT prophylaxis:  Lovenox  Code Status:  Full Family Communication:  None present; I attempted to call his daughter and left a message on her voice mail Disposition Plan:  Home once clinically improved Consults called: None  Admission status: Admit - It is my clinical opinion that admission to INPATIENT is reasonable and necessary because of the expectation that this patient will require hospital care that crosses at least 2 midnights to treat this condition based on the medical complexity of the problems presented.  Given the aforementioned information, the predictability of an adverse outcome is felt to be significant.     Jonah BlueJennifer Dory Verdun MD [facility]   How to contact the [facility] Attending or Consulting provider 7A - 7P or covering provider during after hours 7P -7A, for this patient?  1. Check the care team in [facility] and look for a) attending/consulting [facility] provider listed and b) the [facility] team listed 2. Log into [email or web address] and use [place]'s universal password to access. If you do not have the password, please contact the hospital operator. 3. Locate the [facility] provider you are looking for under [facility] and page to a number that  you can be directly reached. 4. If you still have difficulty reaching the provider, please page the [facility] (Director on Call) for the Hospitalists listed on amion for assistance.   04/08/2019, 6:10 PM

## 2019-04-08 NOTE — ED Triage Notes (Signed)
Pt endorses chills, bodyaches, n/v, fatigue, cough x 3 days. Another family member in the house has the same. 99.3 oral temp. VSS

## 2019-04-09 LAB — COMPREHENSIVE METABOLIC PANEL
ALT: 45 U/L — ABNORMAL HIGH (ref 0–44)
AST: 93 U/L — ABNORMAL HIGH (ref 15–41)
Albumin: 3 g/dL — ABNORMAL LOW (ref 3.5–5.0)
Alkaline Phosphatase: 53 U/L (ref 38–126)
Anion gap: 10 (ref 5–15)
BUN: 12 mg/dL (ref 6–20)
CO2: 23 mmol/L (ref 22–32)
Calcium: 8.1 mg/dL — ABNORMAL LOW (ref 8.9–10.3)
Chloride: 106 mmol/L (ref 98–111)
Creatinine, Ser: 1.1 mg/dL (ref 0.61–1.24)
GFR calc Af Amer: >60 mL/min (ref >60)
GFR calc non Af Amer: >60 mL/min (ref >60)
Glucose, Bld: 111 mg/dL — ABNORMAL HIGH (ref 70–99)
Potassium: 3.5 mmol/L (ref 3.5–5.1)
Sodium: 139 mmol/L (ref 135–145)
Total Bilirubin: 0.4 mg/dL (ref 0.3–1.2)
Total Protein: 6.8 g/dL (ref 6.5–8.1)

## 2019-04-09 LAB — D-DIMER, QUANTITATIVE: D-Dimer, Quant: 0.48 ug/mL-FEU (ref 0.00–0.50)

## 2019-04-09 LAB — CBC WITH DIFFERENTIAL/PLATELET
Abs Immature Granulocytes: 0.03 10*3/uL (ref 0.00–0.07)
Basophils Absolute: 0 10*3/uL (ref 0.0–0.1)
Basophils Relative: 0 %
Eosinophils Absolute: 0 10*3/uL (ref 0.0–0.5)
Eosinophils Relative: 0 %
HCT: 42.7 % (ref 39.0–52.0)
Hemoglobin: 13.8 g/dL (ref 13.0–17.0)
Immature Granulocytes: 1 %
Lymphocytes Relative: 21 %
Lymphs Abs: 0.8 10*3/uL (ref 0.7–4.0)
MCH: 27.2 pg (ref 26.0–34.0)
MCHC: 32.3 g/dL (ref 30.0–36.0)
MCV: 84.1 fL (ref 80.0–100.0)
Monocytes Absolute: 0.3 10*3/uL (ref 0.1–1.0)
Monocytes Relative: 8 %
Neutro Abs: 2.7 10*3/uL (ref 1.7–7.7)
Neutrophils Relative %: 70 %
Platelets: 91 10*3/uL — ABNORMAL LOW (ref 150–400)
RBC: 5.08 MIL/uL (ref 4.22–5.81)
RDW: 13.9 % (ref 11.5–15.5)
WBC: 3.8 10*3/uL — ABNORMAL LOW (ref 4.0–10.5)
nRBC: 0 % (ref 0.0–0.2)

## 2019-04-09 LAB — C-REACTIVE PROTEIN: CRP: 11.3 mg/dL — ABNORMAL HIGH

## 2019-04-09 LAB — LACTATE DEHYDROGENASE: LDH: 386 U/L — ABNORMAL HIGH (ref 98–192)

## 2019-04-09 LAB — FIBRINOGEN: Fibrinogen: 599 mg/dL — ABNORMAL HIGH (ref 210–475)

## 2019-04-09 LAB — FERRITIN: Ferritin: 972 ng/mL — ABNORMAL HIGH (ref 24–336)

## 2019-04-09 MED ORDER — AZITHROMYCIN 250 MG PO TABS
250.0000 mg | ORAL_TABLET | Freq: Every day | ORAL | Status: DC
  Administered 2019-04-09 – 2019-04-10 (×2): 250 mg via ORAL
  Filled 2019-04-09 (×3): qty 1

## 2019-04-09 MED ORDER — ADULT MULTIVITAMIN W/MINERALS CH
1.0000 | ORAL_TABLET | Freq: Every day | ORAL | Status: DC
  Administered 2019-04-09 – 2019-04-10 (×2): 1 via ORAL
  Filled 2019-04-09 (×2): qty 1

## 2019-04-09 MED ORDER — SODIUM CHLORIDE 0.9 % IV SOLN
1.0000 g | INTRAVENOUS | Status: DC
  Administered 2019-04-09 – 2019-04-12 (×4): 1 g via INTRAVENOUS
  Filled 2019-04-09: qty 10
  Filled 2019-04-09 (×2): qty 1
  Filled 2019-04-09: qty 10

## 2019-04-09 MED ORDER — LORAZEPAM 0.5 MG PO TABS
0.5000 mg | ORAL_TABLET | Freq: Once | ORAL | Status: AC
  Administered 2019-04-09: 0.5 mg via ORAL
  Filled 2019-04-09: qty 1

## 2019-04-09 MED ORDER — DM-GUAIFENESIN ER 30-600 MG PO TB12
1.0000 | ORAL_TABLET | Freq: Two times a day (BID) | ORAL | Status: DC
  Administered 2019-04-09 – 2019-04-10 (×3): 1 via ORAL
  Filled 2019-04-09 (×5): qty 1

## 2019-04-09 MED ORDER — ACETAMINOPHEN 325 MG PO TABS
650.0000 mg | ORAL_TABLET | Freq: Once | ORAL | Status: AC
  Administered 2019-04-09: 650 mg via ORAL

## 2019-04-09 NOTE — Progress Notes (Signed)
Patients daughter called for any update. Answered all questions and concerns.

## 2019-04-09 NOTE — Progress Notes (Signed)
CardioVascular Research Department and AHF Team  ReDS Research Project   Patient #: [id number]  ReDS Measurement  Right: 44 %  Left: 51 %

## 2019-04-09 NOTE — Progress Notes (Signed)
Return call from hospital ist ( Dr Hildred Alamin encouraged to reiterate importance of staying in the prone position, O2 to be increased to 6L. ativan 0.5mg  x1 po  dose ordered. Patient placed in prone position. I will  continue monitor pateint as shift progress. At this current time sats ranging bet 91-94% RR bet 25-36

## 2019-04-09 NOTE — Progress Notes (Signed)
PROGRESS NOTE  Melvin Mills  ZOX:[id number] DOB: 05-14-1971 DOA: 04/08/2019 PCP: Patient, No Pcp Per   Brief Narrative: Melvin Mills is a 48 y.o. male with no known medical history who presented with about a week of of fevers, shortness of breath and productive cough, found to have tachypnea, CXR infiltrates, started on antibiotics, found top have covid-19. He was admitted to [facility] for supportive care, continued on antibiotics.   Assessment & Plan: Active Problems:   Acute respiratory disease due to COVID-19 virus  Covid-19 and community acquired pneumonia:  - Continue CAP abx due to elevated procalcitonin, productive cough, which may represent superimposed bacterial infection. For this reason, will not give actemra/steroids.  - Stop 50cc/hr IVF - Monitor cultures - Avoid NSAIDs - Prone if hypoxia develops.  - CXR intermittently  DVT prophylaxis: Lovenox Code Status: Full Family Communication: None at bedside, no answer when called. Disposition Plan: Anticipate DC home if condition stabilizes.   Consultants:   None  Procedures:   None  Antimicrobials:  Ceftriaxone, azithromycin 5/28 >>    Subjective: [name] interpretor used throughout encounter, Jorja Loa [phone]. States he's here for chest pain with cough that started 5 days ago at the same time as fevers at home. This has progressively worsened, becoming more persistent. Feels better since admission but very weak. Has a child and wife who are also sick, feel weak. He's had a poor appetite.   Objective: Vitals:   04/09/19 0422 04/09/19 0600 04/09/19 0615 04/09/19 0800  BP: 121/88   127/87  Pulse: 86 71 82 80  Resp: (!) 36 (!) 22 (!) 29 (!) 36  Temp: (!) 102.3 F (39.1 C)  100.1 F (37.8 C) 99.1 F (37.3 C)  TempSrc: Oral  Oral   SpO2: 94% 94% 95% 95%  Weight:      Height:        Intake/Output Summary (Last 24 hours) at 04/09/2019 0830 Last data filed at 04/09/2019 0424 Gross per 24 hour  Intake 1340 ml  Output  500 ml  Net 840 ml   Filed Weights   04/08/19 1706  Weight: 75 kg    Gen: 48 y.o. male in no distress  Pulm: Non-labored breathing tachypnea on room air. Clear to auscultation bilaterally.  CV: Regular rate and rhythm. No murmur, rub, or gallop. No JVD, no pedal edema. GI: Abdomen soft, non-tender, non-distended, with normoactive bowel sounds. No organomegaly or masses felt. Ext: Warm, no deformities Skin: No rashes, lesions no ulcers Neuro: Alert and oriented. No focal neurological deficits. Psych: Judgement and insight appear normal. Mood & affect appropriate.   Data Reviewed: I have personally reviewed following labs and imaging studies  CBC: Recent Labs  Lab 04/08/19 0926 04/09/19 0500  WBC 7.3 3.8*  NEUTROABS  --  2.7  HGB 15.1 13.8  HCT 46.6 42.7  MCV 81.9 84.1  PLT 87* 91*   Basic Metabolic Panel: Recent Labs  Lab 04/08/19 0926 04/09/19 0500  NA 141 139  K 3.4* 3.5  CL 102 106  CO2 28 23  GLUCOSE 129* 111*  BUN 14 12  CREATININE 1.42* 1.10  CALCIUM 8.9 8.1*   GFR: Estimated Creatinine Clearance: 72.8 mL/min (by C-G formula based on SCr of 1.1 mg/dL). Liver Function Tests: Recent Labs  Lab 04/08/19 0926 04/09/19 0500  AST 69* 93*  ALT 34 45*  ALKPHOS 59 53  BILITOT 0.6 0.4  PROT 7.6 6.8  ALBUMIN 3.3* 3.0*   Recent Labs  Lab 04/08/19 0926  LIPASE 21  No results for input(s): AMMONIA in the last 168 hours. Coagulation Profile: No results for input(s): INR, PROTIME in the last 168 hours. Cardiac Enzymes: No results for input(s): CKTOTAL, CKMB, CKMBINDEX, TROPONINI in the last 168 hours. BNP (last 3 results) No results for input(s): PROBNP in the last 8760 hours. HbA1C: No results for input(s): HGBA1C in the last 72 hours. CBG: No results for input(s): GLUCAP in the last 168 hours. Lipid Profile: No results for input(s): CHOL, HDL, LDLCALC, TRIG, CHOLHDL, LDLDIRECT in the last 72 hours. Thyroid Function Tests: No results for input(s):  TSH, T4TOTAL, FREET4, T3FREE, THYROIDAB in the last 72 hours. Anemia Panel: Recent Labs    04/08/19 2030 04/09/19 0500  FERRITIN 805* 972*   Urine analysis: No results found for: COLORURINE, APPEARANCEUR, LABSPEC, PHURINE, GLUCOSEU, HGBUR, BILIRUBINUR, KETONESUR, PROTEINUR, UROBILINOGEN, NITRITE, LEUKOCYTESUR Recent Results (from the past 240 hour(s))  SARS Coronavirus 2 (CEPHEID- Performed in [facility] hospital lab), Hosp Order     Status: Abnormal   Collection Time: 04/08/19 10:23 AM  Result Value Ref Range Status   SARS Coronavirus 2 POSITIVE (A) NEGATIVE Final    Comment: RESULT CALLED TO, READ BACK BY AND VERIFIED WITH: Duanne Guess RN 12:10 04/08/19 ([email or web address]) (NOTE) If result is NEGATIVE SARS-CoV-2 target nucleic acids are NOT DETECTED. The SARS-CoV-2 RNA is generally detectable in upper and lower  respiratory specimens during the acute phase of infection. The lowest  concentration of SARS-CoV-2 viral copies this assay can detect is 250  copies / mL. A negative result does not preclude SARS-CoV-2 infection  and should not be used as the sole basis for treatment or other  patient management decisions.  A negative result may occur with  improper specimen collection / handling, submission of specimen other  than nasopharyngeal swab, presence of viral mutation(s) within the  areas targeted by this assay, and inadequate number of viral copies  (<250 copies / mL). A negative result must be combined with clinical  observations, patient history, and epidemiological information. If result is POSITIVE SARS-CoV-2 target nucleic acids are DETECTED.  The SARS-CoV-2 RNA is generally detectable in upper and lower  respiratory specimens during the acute phase of infection.  Positive  results are indicative of active infection with SARS-CoV-2.  Clinical  correlation with patient history and other diagnostic information is  necessary to determine patient infection status.  Positive results  do  not rule out bacterial infection or co-infection with other viruses. If result is PRESUMPTIVE POSTIVE SARS-CoV-2 nucleic acids MAY BE PRESENT.   A presumptive positive result was obtained on the submitted specimen  and confirmed on repeat testing.  While 2019 novel coronavirus  (SARS-CoV-2) nucleic acids may be present in the submitted sample  additional confirmatory testing may be necessary for epidemiological  and / or clinical management purposes  to differentiate between  SARS-CoV-2 and other Sarbecovirus currently known to infect humans.  If clinically indicated additional testing with an alternate test  methodology [phone])  is advised. The SARS-CoV-2 RNA is generally  detectable in upper and lower respiratory specimens during the acute  phase of infection. The expected result is Negative. Fact Sheet for Patients:  BoilerBrush.com.cy Fact Sheet for Healthcare Providers: https://pope.com/ This test is not yet approved or cleared by the [place] FDA and has been authorized for detection and/or diagnosis of SARS-CoV-2 by FDA under an Emergency Use Authorization (EUA).  This EUA will remain in effect (meaning this test can be used) for the duration of the COVID-19 declaration  under Section 564(b)(1) of the Act, 21 U.S.C. section 360bbb-3(b)(1), unless the authorization is terminated or revoked sooner. Performed at [facility], [address] [place], [place] [address]       Radiology Studies: Dg Chest Portable 1 View  Result Date: 04/08/2019 CLINICAL DATA:  Cough, weakness EXAM: PORTABLE CHEST 1 VIEW COMPARISON:  None. FINDINGS: Heart is borderline in size. There are patchy bilateral airspace opacities noted, left greater than right. No effusions. No acute bony abnormality. IMPRESSION: Patchy bilateral airspace disease, left greater than right. Findings concerning for pneumonia. Atypical/viral pneumonia is possible.  Electronically Signed   By: Charlett NoseKevin  Dover M.D.   On: 04/08/2019 09:56    Scheduled Meds: . acetaminophen  650 mg Oral Once  . azithromycin  250 mg Oral Daily  . docusate sodium  100 mg Oral BID  . enoxaparin (LOVENOX) injection  40 mg Subcutaneous Q24H  . feeding supplement (ENSURE ENLIVE)  237 mL Oral BID BM  . sodium chloride flush  3 mL Intravenous Q12H   Continuous Infusions: . cefTRIAXone (ROCEPHIN)  IV       LOS: 1 day   Time spent: 25 minutes.  Tyrone Nineyan B Russel Morain, MD [facility] [email or web address] Password [email or web address] 04/09/2019, 8:30 AM

## 2019-04-09 NOTE — Progress Notes (Signed)
Initial Nutrition Assessment RD working remotely.  DOCUMENTATION CODES:   Not applicable  INTERVENTION:   Ensure Enlive po BID, each supplement provides 350 kcal and 20 grams of protein  Multivitamin daily  Pt receiving Hormel Shake daily with Breakfast which provides 520 kcals and 22 g of protein and Magic cup BID with lunch and dinner, each supplement provides 290 kcal and 9 grams of protein, automatically on meal trays to optimize nutritional intake.    NUTRITION DIAGNOSIS:   Increased nutrient needs related to acute illness(COVID-19) as evidenced by estimated needs.  GOAL:   Patient will meet greater than or equal to 90% of their needs  MONITOR:   PO intake, Supplement acceptance, Labs  REASON FOR ASSESSMENT:   Malnutrition Screening Tool    ASSESSMENT:   48 yo [place] male with no known PMH who was admitted with COVID-19 infection. He lives with several family members who are also sick.  Suspect intake has been poor due to acute illness with SOB PTA. Unsure of usual body weight.  Labs reviewed. Medications reviewed and include Colace.  Patient is being offered Ensure Enlive supplements BID. He received one this morning.   NUTRITION - FOCUSED PHYSICAL EXAM:  deferred  Diet Order:   Diet Order            Diet regular Room service appropriate? Yes; Fluid consistency: Thin  Diet effective now              EDUCATION NEEDS:   No education needs have been identified at this time  Skin:  Skin Assessment: Reviewed RN Assessment  Last BM:  5/27  Height:   Ht Readings from Last 1 Encounters:  04/08/19 5\' 3"  (1.6 m)    Weight:   Wt Readings from Last 1 Encounters:  04/08/19 75 kg    Ideal Body Weight:  56.4 kg  BMI:  Body mass index is 29.29 kg/m.  Estimated Nutritional Needs:   Kcal:  1800-2000  Protein:  90-110 gm  Fluid:  >/= 1.8 L    Joaquin Courts, RD, LDN, CNSC Pager [phone] After Hours Pager [phone]

## 2019-04-09 NOTE — Progress Notes (Signed)
Patient satting bet 78-90% on 5L constant coughing. Attempted to prone but refusing to stay in this position. Vitals 130/90 RR ranging bet 25-40. Covering provider Dr Laqueta Due paged via [facility] and informed of above clinical status

## 2019-04-10 ENCOUNTER — Inpatient Hospital Stay (HOSPITAL_COMMUNITY): Payer: BC Managed Care – PPO

## 2019-04-10 DIAGNOSIS — U071 COVID-19: Principal | ICD-10-CM

## 2019-04-10 DIAGNOSIS — J189 Pneumonia, unspecified organism: Secondary | ICD-10-CM

## 2019-04-10 DIAGNOSIS — J069 Acute upper respiratory infection, unspecified: Secondary | ICD-10-CM

## 2019-04-10 DIAGNOSIS — J80 Acute respiratory distress syndrome: Secondary | ICD-10-CM

## 2019-04-10 LAB — POCT I-STAT 7, (LYTES, BLD GAS, ICA,H+H)
Acid-Base Excess: 2 mmol/L (ref 0.0–2.0)
Bicarbonate: 26.4 mmol/L (ref 20.0–28.0)
Calcium, Ion: 1.12 mmol/L — ABNORMAL LOW (ref 1.15–1.40)
HCT: 38 % — ABNORMAL LOW (ref 39.0–52.0)
Hemoglobin: 12.9 g/dL — ABNORMAL LOW (ref 13.0–17.0)
O2 Saturation: 99 %
Potassium: 3.5 mmol/L (ref 3.5–5.1)
Sodium: 138 mmol/L (ref 135–145)
TCO2: 28 mmol/L (ref 22–32)
pCO2 arterial: 41.2 mmHg (ref 32.0–48.0)
pH, Arterial: 7.414 (ref 7.350–7.450)
pO2, Arterial: 152 mmHg — ABNORMAL HIGH (ref 83.0–108.0)

## 2019-04-10 LAB — COMPREHENSIVE METABOLIC PANEL
ALT: 98 U/L — ABNORMAL HIGH (ref 0–44)
AST: 171 U/L — ABNORMAL HIGH (ref 15–41)
Albumin: 2.9 g/dL — ABNORMAL LOW (ref 3.5–5.0)
Alkaline Phosphatase: 81 U/L (ref 38–126)
Anion gap: 9 (ref 5–15)
BUN: 14 mg/dL (ref 6–20)
CO2: 27 mmol/L (ref 22–32)
Calcium: 8.4 mg/dL — ABNORMAL LOW (ref 8.9–10.3)
Chloride: 102 mmol/L (ref 98–111)
Creatinine, Ser: 1.22 mg/dL (ref 0.61–1.24)
GFR calc Af Amer: >60 mL/min (ref >60)
GFR calc non Af Amer: >60 mL/min (ref >60)
Glucose, Bld: 116 mg/dL — ABNORMAL HIGH (ref 70–99)
Potassium: 3.6 mmol/L (ref 3.5–5.1)
Sodium: 138 mmol/L (ref 135–145)
Total Bilirubin: 0.5 mg/dL (ref 0.3–1.2)
Total Protein: 7.3 g/dL (ref 6.5–8.1)

## 2019-04-10 LAB — GLUCOSE, CAPILLARY
Glucose-Capillary: 167 mg/dL — ABNORMAL HIGH (ref 70–99)
Glucose-Capillary: 158 mg/dL — ABNORMAL HIGH (ref 70–99)

## 2019-04-10 LAB — CBC WITH DIFFERENTIAL/PLATELET
Abs Immature Granulocytes: 0.02 10*3/uL (ref 0.00–0.07)
Basophils Absolute: 0 10*3/uL (ref 0.0–0.1)
Basophils Relative: 0 %
Eosinophils Absolute: 0.1 10*3/uL (ref 0.0–0.5)
Eosinophils Relative: 2 %
HCT: 46.2 % (ref 39.0–52.0)
Hemoglobin: 14.4 g/dL (ref 13.0–17.0)
Immature Granulocytes: 0 %
Lymphocytes Relative: 25 %
Lymphs Abs: 1.5 10*3/uL (ref 0.7–4.0)
MCH: 26.3 pg (ref 26.0–34.0)
MCHC: 31.2 g/dL (ref 30.0–36.0)
MCV: 84.5 fL (ref 80.0–100.0)
Monocytes Absolute: 0.5 10*3/uL (ref 0.1–1.0)
Monocytes Relative: 8 %
Neutro Abs: 3.9 10*3/uL (ref 1.7–7.7)
Neutrophils Relative %: 65 %
Platelets: 119 10*3/uL — ABNORMAL LOW (ref 150–400)
RBC: 5.47 MIL/uL (ref 4.22–5.81)
RDW: 14 % (ref 11.5–15.5)
WBC: 6.1 10*3/uL (ref 4.0–10.5)
nRBC: 0 % (ref 0.0–0.2)

## 2019-04-10 LAB — FERRITIN: Ferritin: 1475 ng/mL — ABNORMAL HIGH (ref 24–336)

## 2019-04-10 LAB — PROCALCITONIN: Procalcitonin: 1.3 ng/mL

## 2019-04-10 LAB — D-DIMER, QUANTITATIVE: D-Dimer, Quant: 0.75 ug/mL-FEU — ABNORMAL HIGH (ref 0.00–0.50)

## 2019-04-10 LAB — C-REACTIVE PROTEIN: CRP: 18.9 mg/dL — ABNORMAL HIGH (ref <1.0)

## 2019-04-10 LAB — HIV ANTIBODY (ROUTINE TESTING W REFLEX): HIV Screen 4th Generation wRfx: NONREACTIVE

## 2019-04-10 LAB — LACTATE DEHYDROGENASE: LDH: 552 U/L — ABNORMAL HIGH (ref 98–192)

## 2019-04-10 LAB — FIBRINOGEN: Fibrinogen: >800 mg/dL — ABNORMAL HIGH (ref 210–475)

## 2019-04-10 MED ORDER — SODIUM CHLORIDE 0.9 % IV SOLN
200.0000 mg | Freq: Once | INTRAVENOUS | Status: AC
  Administered 2019-04-10: 200 mg via INTRAVENOUS
  Filled 2019-04-10: qty 40

## 2019-04-10 MED ORDER — FENTANYL CITRATE (PF) 100 MCG/2ML IJ SOLN
50.0000 ug | Freq: Once | INTRAMUSCULAR | Status: AC
  Administered 2019-04-10: 50 ug via INTRAVENOUS

## 2019-04-10 MED ORDER — DEXTROMETHORPHAN POLISTIREX ER 30 MG/5ML PO SUER
30.0000 mg | Freq: Two times a day (BID) | ORAL | Status: DC
  Administered 2019-04-10 – 2019-04-18 (×18): 30 mg
  Filled 2019-04-10 (×19): qty 5

## 2019-04-10 MED ORDER — CHLORHEXIDINE GLUCONATE CLOTH 2 % EX PADS
6.0000 | MEDICATED_PAD | Freq: Every day | CUTANEOUS | Status: DC
  Administered 2019-04-10: 14:00:00 6 via TOPICAL

## 2019-04-10 MED ORDER — FENTANYL BOLUS VIA INFUSION
50.0000 ug | INTRAVENOUS | Status: DC | PRN
  Filled 2019-04-10: qty 50

## 2019-04-10 MED ORDER — PROPOFOL 1000 MG/100ML IV EMUL
0.0000 ug/kg/min | INTRAVENOUS | Status: DC
  Administered 2019-04-10: 30 ug/kg/min via INTRAVENOUS
  Administered 2019-04-10: 5 ug/kg/min via INTRAVENOUS
  Administered 2019-04-11: 20 ug/kg/min via INTRAVENOUS
  Filled 2019-04-10 (×4): qty 100

## 2019-04-10 MED ORDER — ROCURONIUM BROMIDE 10 MG/ML (PF) SYRINGE
70.0000 mg | PREFILLED_SYRINGE | Freq: Once | INTRAVENOUS | Status: AC
  Administered 2019-04-10: 70 mg via INTRAVENOUS

## 2019-04-10 MED ORDER — ORAL CARE MOUTH RINSE: 15.0000 mL | OROMUCOSAL | Status: DC

## 2019-04-10 MED ORDER — GUAIFENESIN 100 MG/5ML PO SOLN
15.0000 mL | Freq: Four times a day (QID) | ORAL | Status: DC
  Administered 2019-04-10: 300 mg
  Administered 2019-04-10 – 2019-04-11 (×2): 15 mL
  Administered 2019-04-11 – 2019-04-19 (×28): 300 mg
  Filled 2019-04-10: qty 15
  Filled 2019-04-10: qty 45
  Filled 2019-04-10 (×3): qty 15
  Filled 2019-04-10: qty 45
  Filled 2019-04-10 (×7): qty 15
  Filled 2019-04-10: qty 30
  Filled 2019-04-10: qty 15
  Filled 2019-04-10: qty 45
  Filled 2019-04-10 (×4): qty 15
  Filled 2019-04-10: qty 45
  Filled 2019-04-10 (×8): qty 15

## 2019-04-10 MED ORDER — CHLORHEXIDINE GLUCONATE 0.12% ORAL RINSE (MEDLINE KIT)
15.0000 mL | Freq: Two times a day (BID) | OROMUCOSAL | Status: DC
  Administered 2019-04-10: 15 mL via OROMUCOSAL

## 2019-04-10 MED ORDER — ADULT MULTIVITAMIN LIQUID CH
15.0000 mL | Freq: Every day | ORAL | Status: DC
  Administered 2019-04-11 – 2019-04-18 (×8): 15 mL
  Filled 2019-04-10 (×8): qty 15

## 2019-04-10 MED ORDER — ENSURE ENLIVE PO LIQD
237.0000 mL | Freq: Two times a day (BID) | ORAL | Status: DC
  Filled 2019-04-10 (×2): qty 237

## 2019-04-10 MED ORDER — CHLORHEXIDINE GLUCONATE 0.12% ORAL RINSE (MEDLINE KIT): 15.0000 mL | Freq: Two times a day (BID) | OROMUCOSAL | Status: DC

## 2019-04-10 MED ORDER — MIDAZOLAM HCL 2 MG/2ML IJ SOLN
4.0000 mg | Freq: Once | INTRAMUSCULAR | Status: AC
  Administered 2019-04-10: 4 mg via INTRAVENOUS

## 2019-04-10 MED ORDER — FENTANYL CITRATE (PF) 100 MCG/2ML IJ SOLN
INTRAMUSCULAR | Status: AC
  Filled 2019-04-10: qty 2

## 2019-04-10 MED ORDER — FENTANYL 2500MCG IN NS 250ML (10MCG/ML) PREMIX INFUSION
50.0000 ug/h | INTRAVENOUS | Status: DC
  Administered 2019-04-10: 50 ug/h via INTRAVENOUS
  Administered 2019-04-11: 175 ug/h via INTRAVENOUS
  Filled 2019-04-10 (×2): qty 250

## 2019-04-10 MED ORDER — AZITHROMYCIN 250 MG PO TABS
250.0000 mg | ORAL_TABLET | Freq: Every day | ORAL | Status: AC
  Administered 2019-04-11 – 2019-04-12 (×2): 250 mg
  Filled 2019-04-10 (×2): qty 1

## 2019-04-10 MED ORDER — CHLORHEXIDINE GLUCONATE 0.12 % MT SOLN
15.0000 mL | Freq: Two times a day (BID) | OROMUCOSAL | Status: DC
  Administered 2019-04-10 – 2019-04-13 (×6): 15 mL via OROMUCOSAL
  Filled 2019-04-10 (×3): qty 15

## 2019-04-10 MED ORDER — METHYLPREDNISOLONE SODIUM SUCC 125 MG IJ SOLR
80.0000 mg | Freq: Three times a day (TID) | INTRAMUSCULAR | Status: DC
  Administered 2019-04-10 – 2019-04-11 (×2): 80 mg via INTRAVENOUS
  Filled 2019-04-10 (×2): qty 2

## 2019-04-10 MED ORDER — DOCUSATE SODIUM 50 MG/5ML PO LIQD
100.0000 mg | Freq: Two times a day (BID) | ORAL | Status: DC
  Administered 2019-04-10 – 2019-04-14 (×9): 100 mg
  Filled 2019-04-10 (×10): qty 10

## 2019-04-10 MED ORDER — METHYLPREDNISOLONE SODIUM SUCC 125 MG IJ SOLR
80.0000 mg | INTRAMUSCULAR | Status: AC
  Administered 2019-04-10: 80 mg via INTRAVENOUS
  Filled 2019-04-10: qty 2

## 2019-04-10 MED ORDER — MIDAZOLAM HCL 2 MG/2ML IJ SOLN: 2.0000 mg | Freq: Once | INTRAMUSCULAR | Status: DC

## 2019-04-10 MED ORDER — FENTANYL CITRATE (PF) 100 MCG/2ML IJ SOLN
100.0000 ug | Freq: Once | INTRAMUSCULAR | Status: AC
  Administered 2019-04-10: 100 ug via INTRAVENOUS

## 2019-04-10 MED ORDER — ETOMIDATE 2 MG/ML IV SOLN
20.0000 mg | Freq: Once | INTRAVENOUS | Status: AC
  Administered 2019-04-10: 20 mg via INTRAVENOUS

## 2019-04-10 MED ORDER — SODIUM CHLORIDE 0.9 % IV SOLN
100.0000 mg | INTRAVENOUS | Status: AC
  Administered 2019-04-11 – 2019-04-14 (×4): 100 mg via INTRAVENOUS
  Filled 2019-04-10 (×4): qty 20

## 2019-04-10 MED ORDER — ACETAMINOPHEN 160 MG/5ML PO SOLN
650.0000 mg | Freq: Four times a day (QID) | ORAL | Status: DC | PRN
  Administered 2019-04-15 – 2019-04-29 (×4): 650 mg via ORAL
  Filled 2019-04-10 (×5): qty 20.3

## 2019-04-10 MED ORDER — CHLORHEXIDINE GLUCONATE CLOTH 2 % EX PADS
6.0000 | MEDICATED_PAD | Freq: Every day | CUTANEOUS | Status: DC
  Administered 2019-04-11 – 2019-04-12 (×3): 6 via TOPICAL

## 2019-04-10 MED ORDER — ORAL CARE MOUTH RINSE
15.0000 mL | OROMUCOSAL | Status: DC
  Administered 2019-04-10 – 2019-04-16 (×61): 15 mL via OROMUCOSAL

## 2019-04-10 MED ORDER — MIDAZOLAM HCL 2 MG/2ML IJ SOLN
INTRAMUSCULAR | Status: AC
  Filled 2019-04-10: qty 4

## 2019-04-10 MED ORDER — ETOMIDATE 2 MG/ML IV SOLN
INTRAVENOUS | Status: AC
  Filled 2019-04-10: qty 20

## 2019-04-10 MED ORDER — ROCURONIUM BROMIDE 10 MG/ML (PF) SYRINGE
PREFILLED_SYRINGE | INTRAVENOUS | Status: AC
  Filled 2019-04-10: qty 10

## 2019-04-10 NOTE — Progress Notes (Signed)
   04/10/19 0210  Vitals  Temp (!) 100.9 F (38.3 C)

## 2019-04-10 NOTE — Progress Notes (Signed)
Used interpreter ipad to communicate to patient that we were moving him to ICU because of his breathing. I haveattempted to contact his daughter 2x via phone with no success. [email or web address]

## 2019-04-10 NOTE — Consult Note (Signed)
NAME:  Melvin Mills, MRN:  [id number], DOB:  04/11/1971, LOS: 2 ADMISSION DATE:  04/08/2019, CONSULTATION DATE:  04/10/2019 REFERRING MD:  Mia CreekRH-MD Grunz, CHIEF COMPLAINT:  Acute hypoxemic respiratory failure   Brief History   48 year old male with no significant PMH who presents to the hospital after 1 week of fevers, SOB and productive cough who came in for tachypnea.  Patient was admitted to PCU and was given rocephin, zithromax, remdisivir and steroids.  On the PCU patient began to decompensate and was breathing 60 times per minutes and saturating 80-85% with increased WOB.  Patient was transferred to the ICU and PCCM was consulted.  History of present illness   48 year old male with no significant PMH who presents to the hospital after 1 week of fevers, SOB and productive cough who came in for tachypnea.  Patient was admitted to PCU and was given rocephin, zithromax, remdisivir and steroids.  On the PCU patient began to decompensate and was breathing 60 times per minutes and saturating 80-85% with increased WOB.  Patient was transferred to the ICU and PCCM was consulted.  Past Medical History  None  Significant Hospital Events   5/30 transfer to the ICU and intubated  Consults:  PCCM  Procedures:  ETT 5/30>>> PIV 5/30>>> OGT 5/30>>>  Significant Diagnostic Tests:  CXR that I reviewed myself, infiltrate noted  Micro Data:  Blood 5/29>>> Urine 5/29>>> Sputum 5/30>>>  Antimicrobials:  Rocephin 5/29>>> Zithromax 5/29>>>   Interim history/subjective:  Increased WOB and unable to speak in more than 1 word sentence  Objective   Blood pressure 131/87, pulse 96, temperature (!) 102.5 F (39.2 C), temperature source Oral, resp. rate (!) 59, height 5\' 3"  (1.6 m), weight 75 kg, SpO2 91 %.    FiO2 (%):  [100 %] 100 %   Intake/Output Summary (Last 24 hours) at 04/10/2019 1444 Last data filed at 04/10/2019 1300 Gross per 24 hour  Intake 1060 ml  Output -  Net 1060 ml   Filed  Weights   04/08/19 1706  Weight: 75 kg    Examination: General: Acutely ill appearing, severe respiratory distress. HENT: [place]/AT, PERRL, EOM-I and MMM Lungs: Coarse BS diffusely Cardiovascular: RRR, Nl S1/S2 and -M/R/G Abdomen: Soft, NT, ND and +BS Extremities: -edema and -tenderness Neuro: Alert and interactive, moving all ext to command Skin: Intact  Resolved Hospital Problem list   N/A  Assessment & Plan:  48 year old male with no PMH who presents PCCM with acute respiratory failure due to COVID and concern for CAP as during intubation some minimal purulent secretions were noted.    Acute respiratory failure:  - Intubate  - Full vent support  - ARDS protocol  - Titrate O2 for sat of 88-92%  - F/U ABG and adjust vent accordingly  - CXR post intubation  - ABG and CXR in AM  - Treat infection  COVID:  - Solumedrol  - Remdesivir  - Hold actemra  CAP:  - Pan culture  - F/U on sputum culture  - Recheck procalcitonin   GI:  - Consult nutrition for TF per nutrition  - Insert OGT  Renal:  - BMET in AM  - Replace electrolytes as indicated  - Hold IVF for now  Labs   CBC: Recent Labs  Lab 04/08/19 0926 04/09/19 0500 04/10/19 0457  WBC 7.3 3.8* 6.1  NEUTROABS  --  2.7 3.9  HGB 15.1 13.8 14.4  HCT 46.6 42.7 46.2  MCV 81.9  84.1 84.5  PLT 87* 91* 119*    Basic Metabolic Panel: Recent Labs  Lab 04/08/19 0926 04/09/19 0500 04/10/19 0457  NA 141 139 138  K 3.4* 3.5 3.6  CL 102 106 102  CO2 28 23 27   GLUCOSE 129* 111* 116*  BUN 14 12 14   CREATININE 1.42* 1.10 1.22  CALCIUM 8.9 8.1* 8.4*   GFR: Estimated Creatinine Clearance: 65.7 mL/min (by C-G formula based on SCr of 1.22 mg/dL). Recent Labs  Lab 04/08/19 0926 04/08/19 1235 04/08/19 2030 04/09/19 0500 04/10/19 0457  PROCALCITON  --   --  1.90  --   --   WBC 7.3  --   --  3.8* 6.1  LATICACIDVEN  --  1.1  --   --   --     Liver Function Tests: Recent Labs  Lab 04/08/19 0926 04/09/19 0500  04/10/19 0457  AST 69* 93* 171*  ALT 34 45* 98*  ALKPHOS 59 53 81  BILITOT 0.6 0.4 0.5  PROT 7.6 6.8 7.3  ALBUMIN 3.3* 3.0* 2.9*   Recent Labs  Lab 04/08/19 0926  LIPASE 21   No results for input(s): AMMONIA in the last 168 hours.  ABG No results found for: PHART, PCO2ART, PO2ART, HCO3, TCO2, ACIDBASEDEF, O2SAT   Coagulation Profile: No results for input(s): INR, PROTIME in the last 168 hours.  Cardiac Enzymes: No results for input(s): CKTOTAL, CKMB, CKMBINDEX, TROPONINI in the last 168 hours.  HbA1C: No results found for: HGBA1C  CBG: No results for input(s): GLUCAP in the last 168 hours.  Review of Systems:   Unattainble   Past Medical History  He,  has a past medical history of COVID-19 virus infection (04/08/2019).   Surgical History   History reviewed. No pertinent surgical history.   Social History   reports that he has never smoked. He has never used smokeless tobacco. He reports that he does not drink alcohol or use drugs.   Family History   His family history is not on file.   Allergies No Known Allergies   Home Medications  Prior to Admission medications   Not on File    The patient is critically ill with multiple organ systems failure and requires high complexity decision making for assessment and support, frequent evaluation and titration of therapies, application of advanced monitoring technologies and extensive interpretation of multiple databases.   Critical Care Time devoted to patient care services described in this note is  90  Minutes. This time reflects time of care of this signee Dr Koren Bound. This critical care time does not reflect procedure time, or teaching time or supervisory time of PA/NP/Med student/Med Resident etc but could involve care discussion time.  Alyson Reedy, M.D. [facility] Pulmonary/Critical Care Medicine. Pager: [phone]. After hours pager: [phone].

## 2019-04-10 NOTE — Progress Notes (Signed)
CardioVascular Research Department and AHF Team  ReDS Research Project   Patient #: [id number]  ReDS Measurement  Right: 54 %  Left: 59 %

## 2019-04-10 NOTE — Progress Notes (Signed)
   04/10/19 0014  Vitals  Temp (!) 102.4 F (39.1 C)  Temp Source Axillary  Pulse Rate 93  ECG Heart Rate 92  Resp (!) 53  Oxygen Therapy  SpO2 (!) 87 %  O2 Device HFNC  O2 Flow Rate (L/min) 15 L/min   Pt tachypneic and hypoxic placed on HFNC and titrated up to 15L to maintain sp02>90. Pt still tachypniec but appears to be more comfortable. Tylenol administered for temp of 102.4. Dr. Rito Ehrlich notified. Will continue to monitor.

## 2019-04-10 NOTE — Progress Notes (Signed)
   04/10/19 0210  Vitals  Temp (!) 100.9 F (38.3 C)   

## 2019-04-10 NOTE — Progress Notes (Signed)
PROGRESS NOTE  Melvin Mills  BMW:[id number] DOB: 1971/03/09 DOA: 04/08/2019 PCP: Patient, No Pcp Per   Brief Narrative: Melvin Mills is a 48 y.o. male with no known medical history who presented with about a week of of fevers, shortness of breath and productive cough, found to have tachypnea, CXR infiltrates, started on antibiotics, found top have covid-19. He was admitted to [facility] for supportive care, continued on antibiotics. Inflammatory markers trended upward and hypoxia worsened. Remdesivir and steroids were given   Assessment & Plan: Active Problems:   Acute respiratory disease due to COVID-19 virus  Covid-19 and community acquired pneumonia:  - Continue CAP abx due to elevated procalcitonin, productive cough, which may represent superimposed bacterial infection. For this reason, will not give actemra/steroids.  - Gave remdesivir this morning in addition to steroids - Monitor cultures - Avoid NSAIDs - Prone if hypoxia develops.  - Repeat CXR following intubation - Prone as able - Prognosis guarded, confirmed full code status this morning with anticipation of worsening and at the time of this note the patient's hypoxia has continued to worsen with respiratory distress. Will transfer to ICU  DVT prophylaxis: Lovenox Code Status: Full Family Communication: None at bedside, no answer when called yesterday, will di. Disposition Plan: Anticipate DC home if condition stabilizes.   Consultants:   None  Procedures:   None  Antimicrobials:  Ceftriaxone, azithromycin 5/28 >>    Remdesivir 5/29 >>   Subjective: [name] interpretor used throughout encounter, Darrel Hoover [phone]. Feels worse than yesterday, mostly worsening weakness generalized. Pain in chest mostly the left front and back worse with cough that has not changed appreciably.   Objective: Vitals:   04/10/19 0013 04/10/19 0014 04/10/19 0210 04/10/19 0433  BP: (!) 136/92   120/87  Pulse: 87 93  76  Resp: (!) 30 (!)  53  (!) 32  Temp: (!) 102.4 F (39.1 C) (!) 102.4 F (39.1 C) (!) 100.9 F (38.3 C) 99.8 F (37.7 C)  TempSrc: Axillary Axillary Oral Oral  SpO2: 92% (!) 87%  94%  Weight:      Height:        Intake/Output Summary (Last 24 hours) at 04/10/2019 0858 Last data filed at 04/09/2019 1700 Gross per 24 hour  Intake 100 ml  Output -  Net 100 ml   Filed Weights   04/08/19 1706  Weight: 75 kg   Gen: 48 y.o. male in no distress Pulm: Nonlabored with supplemental oxygen, diminished. CV: Regular rate and rhythm. No murmur, rub, or gallop. No JVD, no dependent edema. GI: Abdomen soft, non-tender, non-distended, with normoactive bowel sounds.  Ext: Warm, no deformities Skin: No rashes, lesions or ulcers on visualized skin. Neuro: Alert and oriented. No focal neurological deficits. Psych: Judgement and insight appear fair. Mood euthymic & affect congruent. Behavior is appropriate.  Data Reviewed: I have personally reviewed following labs and imaging studies  CBC: Recent Labs  Lab 04/08/19 0926 04/09/19 0500  WBC 7.3 3.8*  NEUTROABS  --  2.7  HGB 15.1 13.8  HCT 46.6 42.7  MCV 81.9 84.1  PLT 87* 91*   Basic Metabolic Panel: Recent Labs  Lab 04/08/19 0926 04/09/19 0500  NA 141 139  K 3.4* 3.5  CL 102 106  CO2 28 23  GLUCOSE 129* 111*  BUN 14 12  CREATININE 1.42* 1.10  CALCIUM 8.9 8.1*   GFR: Estimated Creatinine Clearance: 72.8 mL/min (by C-G formula based on SCr of 1.1 mg/dL). Liver Function Tests: Recent Labs  Lab 04/08/19  [id number] 04/09/19 0500  AST 69* 93*  ALT 34 45*  ALKPHOS 59 53  BILITOT 0.6 0.4  PROT 7.6 6.8  ALBUMIN 3.3* 3.0*   Recent Labs  Lab 04/08/19 0926  LIPASE 21   No results for input(s): AMMONIA in the last 168 hours. Coagulation Profile: No results for input(s): INR, PROTIME in the last 168 hours. Cardiac Enzymes: No results for input(s): CKTOTAL, CKMB, CKMBINDEX, TROPONINI in the last 168 hours. BNP (last 3 results) No results for  input(s): PROBNP in the last 8760 hours. HbA1C: No results for input(s): HGBA1C in the last 72 hours. CBG: No results for input(s): GLUCAP in the last 168 hours. Lipid Profile: No results for input(s): CHOL, HDL, LDLCALC, TRIG, CHOLHDL, LDLDIRECT in the last 72 hours. Thyroid Function Tests: No results for input(s): TSH, T4TOTAL, FREET4, T3FREE, THYROIDAB in the last 72 hours. Anemia Panel: Recent Labs    04/08/19 2030 04/09/19 0500  FERRITIN 805* 972*   Urine analysis: No results found for: COLORURINE, APPEARANCEUR, LABSPEC, PHURINE, GLUCOSEU, HGBUR, BILIRUBINUR, KETONESUR, PROTEINUR, UROBILINOGEN, NITRITE, LEUKOCYTESUR Recent Results (from the past 240 hour(s))  SARS Coronavirus 2 (CEPHEID- [facility]), Hosp Order     Status: Abnormal   Collection Time: 04/08/19 10:23 AM  Result Value Ref Range Status   SARS Coronavirus 2 POSITIVE (A) NEGATIVE Final    Comment: RESULT CALLED TO, READ BACK BY AND VERIFIED WITH: Duanne Guess. Marshall RN 12:10 04/08/19 ([email or web address]) (NOTE) If result is NEGATIVE SARS-CoV-2 target nucleic acids are NOT DETECTED. The SARS-CoV-2 RNA is generally detectable in upper and lower  respiratory specimens during the acute phase of infection. The lowest  concentration of SARS-CoV-2 viral copies this assay can detect is 250  copies / mL. A negative result does not preclude SARS-CoV-2 infection  and should not be used as the sole basis for treatment or other  patient management decisions.  A negative result may occur with  improper specimen collection / handling, submission of specimen other  than nasopharyngeal swab, presence of viral mutation(s) within the  areas targeted by this assay, and inadequate number of viral copies  (<250 copies / mL). A negative result must be combined with clinical  observations, patient history, and epidemiological information. If result is POSITIVE SARS-CoV-2 target nucleic acids are DETECTED.  The SARS-CoV-2 RNA  is generally detectable in upper and lower  respiratory specimens during the acute phase of infection.  Positive  results are indicative of active infection with SARS-CoV-2.  Clinical  correlation with patient history and other diagnostic information is  necessary to determine patient infection status.  Positive results do  not rule out bacterial infection or co-infection with other viruses. If result is PRESUMPTIVE POSTIVE SARS-CoV-2 nucleic acids MAY BE PRESENT.   A presumptive positive result was obtained on the submitted specimen  and confirmed on repeat testing.  While 2019 novel coronavirus  (SARS-CoV-2) nucleic acids may be present in the submitted sample  additional confirmatory testing may be necessary for epidemiological  and / or clinical management purposes  to differentiate between  SARS-CoV-2 and other Sarbecovirus currently known to infect humans.  If clinically indicated additional testing with an alternate test  methodology [phone]([id number])  is advised. The SARS-CoV-2 RNA is generally  detectable in upper and lower respiratory specimens during the acute  phase of infection. The expected result is Negative. Fact Sheet for Patients:  BoilerBrush.com.cyhttps://www.fda.gov/media/136312/download Fact Sheet for Healthcare Providers: https://pope.com/https://www.fda.gov/media/136313/download This test is not yet approved or cleared by the [place] FDA and has been authorized for detection and/or diagnosis of SARS-CoV-2 by FDA under an Emergency Use Authorization (EUA).  This EUA will remain in effect (meaning this test can be used) for the duration of the COVID-19 declaration under Section 564(b)(1) of the Act, 21 U.S.C. section 360bbb-3(b)(1), unless the authorization is terminated or revoked sooner. Performed at [facility], [address], [place], [place] [address]   Blood culture (routine x 2)     Status: None (Preliminary result)   Collection Time: 04/08/19 11:15 AM  Result Value Ref Range  Status   Specimen Description BLOOD RIGHT ANTECUBITAL  Final   Special Requests   Final    BOTTLES DRAWN AEROBIC AND ANAEROBIC Blood Culture adequate volume   Culture   Final    NO GROWTH 1 DAY Performed at [facility], [address], [place], [place] [address]    Report Status PENDING  Incomplete      Radiology Studies: Dg Chest Portable 1 View  Result Date: 04/08/2019 CLINICAL DATA:  Cough, weakness EXAM: PORTABLE CHEST 1 VIEW COMPARISON:  None. FINDINGS: Heart is borderline in size. There are patchy bilateral airspace opacities noted, left greater than right. No effusions. No acute bony abnormality. IMPRESSION: Patchy bilateral airspace disease, left greater than right. Findings concerning for pneumonia. Atypical/viral pneumonia is possible. Electronically Signed   By: Charlett Nose M.D.   On: 04/08/2019 09:56    Scheduled Meds: . azithromycin  250 mg Oral Daily  . dextromethorphan-guaiFENesin  1 tablet Oral BID  . docusate sodium  100 mg Oral BID  . enoxaparin (LOVENOX) injection  40 mg Subcutaneous Q24H  . feeding supplement (ENSURE ENLIVE)  237 mL Oral BID BM  . multivitamin with minerals  1 tablet Oral Daily  . sodium chloride flush  3 mL Intravenous Q12H   Continuous Infusions: . cefTRIAXone (ROCEPHIN)  IV Stopped (04/09/19 0940)  . remdesivir 200 mg in NS 250 mL     Followed by  . [START ON 04/11/2019] remdesivir 100 mg in NS 250 mL       LOS: 2 days   Time spent: 35 minutes.  Tyrone Nine, MD [facility] [email or web address] Password [email or web address] 04/10/2019, 8:58 AM

## 2019-04-10 NOTE — Progress Notes (Signed)
   04/10/19 0210  Vitals  Temp (!) 100.9 F (38.3 C)  Temp Source Oral  Temp now as listed above

## 2019-04-10 NOTE — Progress Notes (Signed)
Spoke with patients daughter and updated. Melodye Ped

## 2019-04-10 NOTE — Progress Notes (Signed)
pts sats have been 85-94 today on 15 liters hi flo nasal cannula. Pt has had dry high pitched cough all day. Suddenly sats are in 70's on 15 liters hi flo, hasn't been moving around. Pt's is breathing 50 times a minute, color is gray . Placed pt on 100%nrb and now 89% but still breathing 49 times a minute. Heart rate 96,bp 13187,temp 102.5 orally.RT paged. Tammy [email or web address].rn

## 2019-04-10 NOTE — Procedures (Signed)
Intubation Procedure Note Melvin Mills [id number] January 06, 1971  Procedure: Intubation Indications: Airway protection and maintenance and ARDS  Procedure Details Consent: Unable to obtain consent because of Emergent. Time Out: Verified patient identification, verified procedure, site/side was marked, verified correct patient position, special equipment/implants available, medications/allergies/relevent history reviewed, required imaging and test results available.  Performed  Maximum sterile technique was used including cap, gloves, gown, hand hygiene, mask and sheet.  MAC    Evaluation Hemodynamic Status: BP stable throughout; O2 sats: stable throughout Patient's Current Condition: stable Complications: No apparent complications Patient did tolerate procedure well. Chest X-ray ordered to verify placement.  CXR: pending.   YACOUB,WESAM 04/10/2019

## 2019-04-10 NOTE — Progress Notes (Signed)
   04/10/19 0433  Vitals  Temp 99.8 F (37.7 C)  Temp now as listed above

## 2019-04-10 NOTE — Procedures (Signed)
OGT Placement By MD  OGT placed under intubation shield to avoid aerosolization under direct laryngoscopy and verified by auscultation  Alyson Reedy, M.D. [facility] Pulmonary/Critical Care Medicine. Pager: [phone]. After hours pager: [phone].

## 2019-04-10 NOTE — Procedures (Signed)
Intubation Procedure Note Melvin Mills [id number] 08/22/1971  Procedure: Intubation Indications: Respiratory insufficiency  Procedure Details Consent: Unable to obtain consent because of emergent medical necessity. Time Out: Verified patient identification, verified procedure, site/side was marked, verified correct patient position, special equipment/implants available, medications/allergies/relevent history reviewed, required imaging and test results available.  Performed  Maximum sterile technique was used including cap, gloves, gown, hand hygiene, mask and sheet.  MAC 4      Evaluation Hemodynamic Status: BP stable throughout; O2 sats: stable throughout Patient's Current Condition: stable Complications: No apparent complications Patient did tolerate procedure well. Chest X-ray ordered to verify placement.  CXR: pending.  Pt intubated using Intubation box for protection and Glidescope #4 with 7.5 ett secured at 23 at the lip. Positive color change noted on etco2, direct visualization, bilateral clear BS throughout. CXR pending.    [name] 04/10/2019

## 2019-04-11 ENCOUNTER — Inpatient Hospital Stay (HOSPITAL_COMMUNITY): Payer: BC Managed Care – PPO

## 2019-04-11 DIAGNOSIS — J988 Other specified respiratory disorders: Secondary | ICD-10-CM

## 2019-04-11 DIAGNOSIS — Z978 Presence of other specified devices: Secondary | ICD-10-CM

## 2019-04-11 DIAGNOSIS — Z9289 Personal history of other medical treatment: Secondary | ICD-10-CM

## 2019-04-11 LAB — CBC WITH DIFFERENTIAL/PLATELET
Abs Immature Granulocytes: 0.04 10*3/uL (ref 0.00–0.07)
Basophils Absolute: 0 10*3/uL (ref 0.0–0.1)
Basophils Relative: 0 %
Eosinophils Absolute: 0 10*3/uL (ref 0.0–0.5)
Eosinophils Relative: 0 %
HCT: 42.3 % (ref 39.0–52.0)
Hemoglobin: 13.8 g/dL (ref 13.0–17.0)
Immature Granulocytes: 1 %
Lymphocytes Relative: 8 %
Lymphs Abs: 0.5 10*3/uL — ABNORMAL LOW (ref 0.7–4.0)
MCH: 27.3 pg (ref 26.0–34.0)
MCHC: 32.6 g/dL (ref 30.0–36.0)
MCV: 83.8 fL (ref 80.0–100.0)
Monocytes Absolute: 0.3 10*3/uL (ref 0.1–1.0)
Monocytes Relative: 4 %
Neutro Abs: 5.7 10*3/uL (ref 1.7–7.7)
Neutrophils Relative %: 87 %
Platelets: 124 10*3/uL — ABNORMAL LOW (ref 150–400)
RBC: 5.05 MIL/uL (ref 4.22–5.81)
RDW: 14.4 % (ref 11.5–15.5)
WBC: 6.5 10*3/uL (ref 4.0–10.5)
nRBC: 0 % (ref 0.0–0.2)

## 2019-04-11 LAB — COMPREHENSIVE METABOLIC PANEL
ALT: 90 U/L — ABNORMAL HIGH (ref 0–44)
AST: 108 U/L — ABNORMAL HIGH (ref 15–41)
Albumin: 2.6 g/dL — ABNORMAL LOW (ref 3.5–5.0)
Alkaline Phosphatase: 73 U/L (ref 38–126)
Anion gap: 12 (ref 5–15)
BUN: 30 mg/dL — ABNORMAL HIGH (ref 6–20)
CO2: 25 mmol/L (ref 22–32)
Calcium: 8 mg/dL — ABNORMAL LOW (ref 8.9–10.3)
Chloride: 101 mmol/L (ref 98–111)
Creatinine, Ser: 1.28 mg/dL — ABNORMAL HIGH (ref 0.61–1.24)
GFR calc Af Amer: >60 mL/min (ref >60)
GFR calc non Af Amer: >60 mL/min (ref >60)
Glucose, Bld: 222 mg/dL — ABNORMAL HIGH (ref 70–99)
Potassium: 3.9 mmol/L (ref 3.5–5.1)
Sodium: 138 mmol/L (ref 135–145)
Total Bilirubin: 0.4 mg/dL (ref 0.3–1.2)
Total Protein: 7 g/dL (ref 6.5–8.1)

## 2019-04-11 LAB — POCT I-STAT 7, (LYTES, BLD GAS, ICA,H+H)
Acid-Base Excess: 2 mmol/L (ref 0.0–2.0)
Bicarbonate: 28.1 mmol/L — ABNORMAL HIGH (ref 20.0–28.0)
Calcium, Ion: 1.13 mmol/L — ABNORMAL LOW (ref 1.15–1.40)
HCT: 39 % (ref 39.0–52.0)
Hemoglobin: 13.3 g/dL (ref 13.0–17.0)
O2 Saturation: 97 %
Patient temperature: 98.6
Potassium: 3.9 mmol/L (ref 3.5–5.1)
Sodium: 138 mmol/L (ref 135–145)
TCO2: 29 mmol/L (ref 22–32)
pCO2 arterial: 46.4 mmHg (ref 32.0–48.0)
pH, Arterial: 7.39 (ref 7.350–7.450)
pO2, Arterial: 90 mmHg (ref 83.0–108.0)

## 2019-04-11 LAB — EXPECTORATED SPUTUM ASSESSMENT W GRAM STAIN, RFLX TO RESP C

## 2019-04-11 LAB — GLUCOSE, CAPILLARY
Glucose-Capillary: 207 mg/dL — ABNORMAL HIGH (ref 70–99)
Glucose-Capillary: 204 mg/dL — ABNORMAL HIGH (ref 70–99)
Glucose-Capillary: 205 mg/dL — ABNORMAL HIGH (ref 70–99)

## 2019-04-11 LAB — TYPE AND SCREEN
ABO/RH(D): O POS
Antibody Screen: NEGATIVE

## 2019-04-11 LAB — TRIGLYCERIDES
Triglycerides: 127 mg/dL (ref <150)
Triglycerides: 130 mg/dL (ref <150)

## 2019-04-11 LAB — PHOSPHORUS
Phosphorus: 3.8 mg/dL (ref 2.5–4.6)
Phosphorus: 3.1 mg/dL (ref 2.5–4.6)

## 2019-04-11 LAB — ABO/RH: ABO/RH(D): O POS

## 2019-04-11 LAB — C-REACTIVE PROTEIN: CRP: 25.3 mg/dL — ABNORMAL HIGH (ref <1.0)

## 2019-04-11 LAB — FERRITIN: Ferritin: 1750 ng/mL — ABNORMAL HIGH (ref 24–336)

## 2019-04-11 LAB — D-DIMER, QUANTITATIVE: D-Dimer, Quant: 0.9 ug/mL-FEU — ABNORMAL HIGH (ref 0.00–0.50)

## 2019-04-11 LAB — MAGNESIUM
Magnesium: 2.3 mg/dL (ref 1.7–2.4)
Magnesium: 2.5 mg/dL — ABNORMAL HIGH (ref 1.7–2.4)

## 2019-04-11 LAB — LACTATE DEHYDROGENASE: LDH: 532 U/L — ABNORMAL HIGH (ref 98–192)

## 2019-04-11 LAB — FIBRINOGEN: Fibrinogen: 780 mg/dL — ABNORMAL HIGH (ref 210–475)

## 2019-04-11 MED ORDER — FENTANYL CITRATE (PF) 100 MCG/2ML IJ SOLN: 50.0000 ug | INTRAMUSCULAR | Status: DC | PRN

## 2019-04-11 MED ORDER — FAMOTIDINE 40 MG/5ML PO SUSR
20.0000 mg | Freq: Two times a day (BID) | ORAL | Status: DC
  Administered 2019-04-11 – 2019-04-16 (×11): 20 mg via ORAL
  Filled 2019-04-11 (×11): qty 2.5

## 2019-04-11 MED ORDER — VITAL HIGH PROTEIN PO LIQD: 1000.0000 mL | ORAL | Status: DC

## 2019-04-11 MED ORDER — PROPOFOL 1000 MG/100ML IV EMUL
5.0000 ug/kg/min | INTRAVENOUS | Status: DC
  Administered 2019-04-11: 15 ug/kg/min via INTRAVENOUS

## 2019-04-11 MED ORDER — PRO-STAT SUGAR FREE PO LIQD: 30.0000 mL | Freq: Two times a day (BID) | ORAL | Status: DC

## 2019-04-11 MED ORDER — VITAL HIGH PROTEIN PO LIQD
1000.0000 mL | ORAL | Status: DC
  Administered 2019-04-11: 1000 mL

## 2019-04-11 MED ORDER — FENTANYL 2500MCG IN NS 250ML (10MCG/ML) PREMIX INFUSION
0.0000 ug/h | INTRAVENOUS | Status: DC
  Administered 2019-04-11: 19:00:00 175 ug/h via INTRAVENOUS
  Administered 2019-04-12: 100 ug/h via INTRAVENOUS
  Filled 2019-04-11 (×2): qty 250

## 2019-04-11 MED ORDER — PRO-STAT SUGAR FREE PO LIQD
30.0000 mL | Freq: Two times a day (BID) | ORAL | Status: DC
  Administered 2019-04-11: 30 mL
  Filled 2019-04-11 (×2): qty 30

## 2019-04-11 MED ORDER — VITAL AF 1.2 CAL PO LIQD
1000.0000 mL | ORAL | Status: DC
  Administered 2019-04-11 – 2019-04-12 (×2): 1000 mL
  Filled 2019-04-11: qty 1000

## 2019-04-11 MED ORDER — INSULIN ASPART 100 UNIT/ML ~~LOC~~ SOLN
0.0000 [IU] | SUBCUTANEOUS | Status: DC
  Administered 2019-04-11: 3 [IU] via SUBCUTANEOUS
  Administered 2019-04-12: 1 [IU] via SUBCUTANEOUS
  Administered 2019-04-12 (×2): 2 [IU] via SUBCUTANEOUS
  Administered 2019-04-12: 01:00:00 3 [IU] via SUBCUTANEOUS
  Administered 2019-04-12 (×2): 5 [IU] via SUBCUTANEOUS
  Administered 2019-04-12 – 2019-04-13 (×3): 2 [IU] via SUBCUTANEOUS
  Administered 2019-04-13 (×2): 3 [IU] via SUBCUTANEOUS
  Administered 2019-04-13: 04:00:00 2 [IU] via SUBCUTANEOUS
  Administered 2019-04-13 – 2019-04-14 (×2): 3 [IU] via SUBCUTANEOUS
  Administered 2019-04-14 (×2): 2 [IU] via SUBCUTANEOUS
  Administered 2019-04-14 (×2): 3 [IU] via SUBCUTANEOUS
  Administered 2019-04-15 (×2): 5 [IU] via SUBCUTANEOUS
  Administered 2019-04-15: 04:00:00 3 [IU] via SUBCUTANEOUS
  Administered 2019-04-15 (×2): 2 [IU] via SUBCUTANEOUS
  Administered 2019-04-15: 12:00:00 5 [IU] via SUBCUTANEOUS
  Administered 2019-04-15: 20:00:00 7 [IU] via SUBCUTANEOUS
  Administered 2019-04-16 (×2): 3 [IU] via SUBCUTANEOUS
  Administered 2019-04-16: 20:00:00 1 [IU] via SUBCUTANEOUS
  Administered 2019-04-16: 17:00:00 2 [IU] via SUBCUTANEOUS
  Administered 2019-04-16: 05:00:00 3 [IU] via SUBCUTANEOUS
  Administered 2019-04-17: 16:00:00 2 [IU] via SUBCUTANEOUS
  Administered 2019-04-17: 5 [IU] via SUBCUTANEOUS
  Administered 2019-04-18: 2 [IU] via SUBCUTANEOUS
  Administered 2019-04-18: 09:00:00 1 [IU] via SUBCUTANEOUS
  Administered 2019-04-19: 3 [IU] via SUBCUTANEOUS
  Administered 2019-04-19: 2 [IU] via SUBCUTANEOUS
  Administered 2019-04-19: 1 [IU] via SUBCUTANEOUS
  Administered 2019-04-20: 21:00:00 2 [IU] via SUBCUTANEOUS
  Administered 2019-04-20: 17:00:00 1 [IU] via SUBCUTANEOUS
  Administered 2019-04-20: 3 [IU] via SUBCUTANEOUS

## 2019-04-11 MED ORDER — MIDAZOLAM HCL 2 MG/2ML IJ SOLN: 2.0000 mg | INTRAMUSCULAR | Status: DC | PRN

## 2019-04-11 MED ORDER — METHYLPREDNISOLONE SODIUM SUCC 40 MG IJ SOLR
40.0000 mg | Freq: Two times a day (BID) | INTRAMUSCULAR | Status: DC
  Administered 2019-04-11 – 2019-04-14 (×6): 40 mg via INTRAVENOUS
  Filled 2019-04-11 (×6): qty 1

## 2019-04-11 MED ORDER — SODIUM CHLORIDE 0.9% IV SOLUTION: Freq: Once | INTRAVENOUS | Status: DC

## 2019-04-11 MED ORDER — ENOXAPARIN SODIUM 40 MG/0.4ML ~~LOC~~ SOLN
40.0000 mg | Freq: Two times a day (BID) | SUBCUTANEOUS | Status: DC
  Administered 2019-04-11 – 2019-04-13 (×5): 40 mg via SUBCUTANEOUS
  Filled 2019-04-11 (×5): qty 0.4

## 2019-04-11 MED ORDER — FENTANYL BOLUS VIA INFUSION
50.0000 ug | INTRAVENOUS | Status: DC | PRN
  Filled 2019-04-11: qty 100

## 2019-04-11 NOTE — Progress Notes (Signed)
[facility] PCCM  Passing SBT for > 2 hours Extubate Still awaiting type and screen result to be able to give plasma  Heber [place], MD [place] PCCM Pager: [phone] Cell: [phone] If no response, call [phone]

## 2019-04-11 NOTE — Plan of Care (Signed)
?  Problem: Role Relationship: ?Goal: Method of communication will improve ?Outcome: Progressing ?  ?

## 2019-04-11 NOTE — Progress Notes (Signed)
PROGRESS NOTE  Melvin Mills FVO:[id number] DOB: 02-Sep-1971 DOA: 04/08/2019 PCP: Patient, No Pcp Per   LOS: 3 days   Brief Narrative / Interim history: Brief Narrative / Interim history: 48 year old male with no medical history presents to the hospital and was admitted on 04/08/2019 with about a week worth of fevers, shortness of breath, productive cough, found to have tachypnea, chest x-ray infiltrate.  He was found to be positive for COVID-19.  Patient was initially started on antibiotics, was in regular floor however had progressive hypoxia requiring intubation on 5/30  Subjective: -Alert on the vent, appears calm  Assessment & Plan: Active Problems:   COVID-19  Principal Problem Acute Hypoxic Respiratory Failure due to Covid-19 Viral Illness -Patient had rapidly progressive respiratory failure since admission requiring intubation on 04/10/2019.  He was moved to the ICU.  Critical care following.  Continue full vent support -Patient was started on ceftriaxone, azithromycin and will continue -Given concern for sepsis he was not started on Actemra -Patient started on Remdesivir on 5/30, continue  Vent Mode: PRVC FiO2 (%):  [40 %-100 %] 40 % Set Rate:  [30 bmp] 30 bmp Vt Set:  [450 mL-550 mL] 450 mL PEEP:  [12 cmH20-16 cmH20] 12 cmH20 Plateau Pressure:  [24 cmH20-34 cmH20] 24 cmH20    COVID-19 Labs  Recent Labs    04/09/19 0500 04/10/19 0457 04/11/19 0540  DDIMER 0.48 0.75* 0.90*  FERRITIN 972* 1,475* 1,750*  LDH 386* 552* 532*  CRP 11.3* 18.9* 25.3*    Lab Results  Component Value Date   SARSCOV2NAA POSITIVE (A) 04/08/2019   Scheduled Meds: . azithromycin  250 mg Per Tube Daily  . chlorhexidine  15 mL Mouth/Throat BID  . Chlorhexidine Gluconate Cloth  6 each Topical Daily  . dextromethorphan  30 mg Per Tube BID   And  . guaiFENesin  15 mL Per Tube Q6H  . docusate  100 mg Per Tube BID  . enoxaparin (LOVENOX) injection  40 mg Subcutaneous BID  . famotidine  20 mg  Oral BID  . mouth rinse  15 mL Mouth Rinse 10 times per day  . methylPREDNISolone (SOLU-MEDROL) injection  40 mg Intravenous Q12H  . multivitamin  15 mL Per Tube Daily  . sodium chloride flush  3 mL Intravenous Q12H   Continuous Infusions: . cefTRIAXone (ROCEPHIN)  IV 1 g (04/11/19 0919)  . feeding supplement (VITAL AF 1.2 CAL)    . fentaNYL infusion INTRAVENOUS 175 mcg/hr (04/11/19 0348)  . propofol (DIPRIVAN) infusion 20 mcg/kg/min (04/11/19 0603)  . remdesivir 100 mg in NS 250 mL 100 mg (04/11/19 0830)   PRN Meds:.acetaminophen (TYLENOL) oral liquid 160 mg/5 mL, bisacodyl, fentaNYL, ondansetron **OR** ondansetron (ZOFRAN) IV, oxyCODONE, polyethylene glycol  DVT prophylaxis: Lovenox Code Status: Full code Family Communication: PCCM was discussed with family Disposition Plan: To be determined  Consultants:   Critical care  Procedures:   None   Antimicrobials:  Ceftriaxone, azithromycin 5/28 --  Remdesivir 5/29 --  Objective: Vitals:   04/11/19 0500 04/11/19 0600 04/11/19 0700 04/11/19 0717  BP: 95/80  (!) 118/94 (!) 118/94  Pulse: (!) 56 (!) 50 (!) 49 (!) 50  Resp: (!) 30 (!) 27 (!) 30 (!) 30  Temp:    97.8 F (36.6 C)  TempSrc:    Axillary  SpO2: 99% 98% 100% 100%  Weight:  73.6 kg    Height:        Intake/Output Summary (Last 24 hours) at 04/11/2019 1205 Last data filed at 04/11/2019  0700 Gross per 24 hour  Intake 1085.57 ml  Output 700 ml  Net 385.57 ml   Filed Weights   04/08/19 1706 04/11/19 0600  Weight: 75 kg 73.6 kg    Examination:  Constitutional: Alert, on the vent Eyes: lids and conjunctivae normal ENMT: Mucous membranes are dry.  ETT in place Neck: normal, supple Respiratory: clear to auscultation bilaterally, no wheezing, no crackles.  Cardiovascular: Regular rate and rhythm, no murmurs / rubs / gallops. No LE edema.  Abdomen: soft. Bowel sounds positive.  Musculoskeletal: no clubbing / cyanosis.  Skin: no rashes Neurologic: Sedated    Data Reviewed: I have independently reviewed following labs and imaging studies   CBC: Recent Labs  Lab 04/08/19 0926 04/09/19 0500 04/10/19 0457 04/10/19 1729 04/11/19 0451 04/11/19 0540  WBC 7.3 3.8* 6.1  --   --  6.5  NEUTROABS  --  2.7 3.9  --   --  5.7  HGB 15.1 13.8 14.4 12.9* 13.3 13.8  HCT 46.6 42.7 46.2 38.0* 39.0 42.3  MCV 81.9 84.1 84.5  --   --  83.8  PLT 87* 91* 119*  --   --  124*   Basic Metabolic Panel: Recent Labs  Lab 04/08/19 0926 04/09/19 0500 04/10/19 0457 04/10/19 1729 04/11/19 0451 04/11/19 0540  NA 141 139 138 138 138 138  K 3.4* 3.5 3.6 3.5 3.9 3.9  CL 102 106 102  --   --  101  CO2 --   --  25  GLUCOSE 129* 111* 116*  --   --  222*  BUN --   --  30*  CREATININE 1.42* 1.10 1.22  --   --  1.28*  CALCIUM 8.9 8.1* 8.4*  --   --  8.0*  MG  --   --   --   --   --  2.3  PHOS  --   --   --   --   --  3.8   GFR: Estimated Creatinine Clearance: 62.1 mL/min (A) (by C-G formula based on SCr of 1.28 mg/dL (H)). Liver Function Tests: Recent Labs  Lab 04/08/19 0926 04/09/19 0500 04/10/19 0457 04/11/19 0540  AST 69* 93* 171* 108*  ALT 34 45* 98* 90*  ALKPHOS 59 53 81 73  BILITOT 0.6 0.4 0.5 0.4  PROT 7.6 6.8 7.3 7.0  ALBUMIN 3.3* 3.0* 2.9* 2.6*   Recent Labs  Lab 04/08/19 0926  LIPASE 21   No results for input(s): AMMONIA in the last 168 hours. Coagulation Profile: No results for input(s): INR, PROTIME in the last 168 hours. Cardiac Enzymes: No results for input(s): CKTOTAL, CKMB, CKMBINDEX, TROPONINI in the last 168 hours. BNP (last 3 results) No results for input(s): PROBNP in the last 8760 hours. HbA1C: No results for input(s): HGBA1C in the last 72 hours. CBG: Recent Labs  Lab 04/10/19 1630 04/10/19 1940 04/11/19 0822  GLUCAP 167* 158* 207*   Lipid Profile: Recent Labs    04/11/19 0540  TRIG 127   Thyroid Function Tests: No results for input(s): TSH, T4TOTAL, FREET4, T3FREE, THYROIDAB in the last  72 hours. Anemia Panel: Recent Labs    04/10/19 0457 04/11/19 0540  FERRITIN 1,475* 1,750*   Urine analysis: No results found for: COLORURINE, APPEARANCEUR, LABSPEC, PHURINE, GLUCOSEU, HGBUR, BILIRUBINUR, KETONESUR, PROTEINUR, UROBILINOGEN, NITRITE, LEUKOCYTESUR Sepsis Labs: Invalid input(s): PROCALCITONIN, LACTICIDVEN  Recent Results (from the past 240 hour(s))  SARS Coronavirus 2 (CEPHEID- Performed in [facility]  hospital lab), Hosp Order     Status: Abnormal   Collection Time: 04/08/19 10:23 AM  Result Value Ref Range Status   SARS Coronavirus 2 POSITIVE (A) NEGATIVE Final    Comment: RESULT CALLED TO, READ BACK BY AND VERIFIED WITH: Duanne Guess RN 12:10 04/08/19 ([email or web address]) (NOTE) If result is NEGATIVE SARS-CoV-2 target nucleic acids are NOT DETECTED. The SARS-CoV-2 RNA is generally detectable in upper and lower  respiratory specimens during the acute phase of infection. The lowest  concentration of SARS-CoV-2 viral copies this assay can detect is 250  copies / mL. A negative result does not preclude SARS-CoV-2 infection  and should not be used as the sole basis for treatment or other  patient management decisions.  A negative result may occur with  improper specimen collection / handling, submission of specimen other  than nasopharyngeal swab, presence of viral mutation(s) within the  areas targeted by this assay, and inadequate number of viral copies  (<250 copies / mL). A negative result must be combined with clinical  observations, patient history, and epidemiological information. If result is POSITIVE SARS-CoV-2 target nucleic acids are DETECTED.  The SARS-CoV-2 RNA is generally detectable in upper and lower  respiratory specimens during the acute phase of infection.  Positive  results are indicative of active infection with SARS-CoV-2.  Clinical  correlation with patient history and other diagnostic information is  necessary to determine patient infection status.   Positive results do  not rule out bacterial infection or co-infection with other viruses. If result is PRESUMPTIVE POSTIVE SARS-CoV-2 nucleic acids MAY BE PRESENT.   A presumptive positive result was obtained on the submitted specimen  and confirmed on repeat testing.  While 2019 novel coronavirus  (SARS-CoV-2) nucleic acids may be present in the submitted sample  additional confirmatory testing may be necessary for epidemiological  and / or clinical management purposes  to differentiate between  SARS-CoV-2 and other Sarbecovirus currently known to infect humans.  If clinically indicated additional testing with an alternate test  methodology [phone])  is advised. The SARS-CoV-2 RNA is generally  detectable in upper and lower respiratory specimens during the acute  phase of infection. The expected result is Negative. Fact Sheet for Patients:  BoilerBrush.com.cy Fact Sheet for Healthcare Providers: https://pope.com/ This test is not yet approved or cleared by the [place] FDA and has been authorized for detection and/or diagnosis of SARS-CoV-2 by FDA under an Emergency Use Authorization (EUA).  This EUA will remain in effect (meaning this test can be used) for the duration of the COVID-19 declaration under Section 564(b)(1) of the Act, 21 U.S.C. section 360bbb-3(b)(1), unless the authorization is terminated or revoked sooner. Performed at [facility], [address], [place], [place] [address]   Blood culture (routine x 2)     Status: None (Preliminary result)   Collection Time: 04/08/19 11:15 AM  Result Value Ref Range Status   Specimen Description BLOOD RIGHT ANTECUBITAL  Final   Special Requests   Final    BOTTLES DRAWN AEROBIC AND ANAEROBIC Blood Culture adequate volume   Culture   Final    NO GROWTH 2 DAYS Performed at [facility], [address], [place], [place] [address]    Report Status PENDING   Incomplete      Radiology Studies: Dg Chest Port 1 View  Result Date: 04/11/2019 CLINICAL DATA:  COVID, ETT EXAM: PORTABLE CHEST 1 VIEW COMPARISON:  04/10/2019 FINDINGS: Multifocal patchy airspace opacities in the lungs bilaterally,  with sparing of the left lung base, grossly unchanged. No pleural effusion or pneumothorax. Endotracheal tube terminates 6 cm above the carina. Enteric tube courses below the diaphragm. IMPRESSION: Endotracheal tube terminates 6 cm above the carina. Stable multifocal pneumonia. Electronically Signed   By: Charline BillsSriyesh  Krishnan M.D.   On: 04/11/2019 04:51   Dg Chest Port 1 View  Result Date: 04/10/2019 CLINICAL DATA:  COVID-19 positive, intubated EXAM: PORTABLE CHEST 1 VIEW COMPARISON:  04/08/2019 FINDINGS: Worsening bilateral patchy airspace process compatible with progressive pneumonia. Endotracheal tube 6.2 cm above the carina. NG tube in the proximal stomach. Monitor leads overlie the chest. No effusion or pneumothorax. IMPRESSION: Worsening bilateral pneumonia pattern. Support apparatus in good position. Electronically Signed   By: Judie PetitM.  Shick M.D.   On: 04/10/2019 17:34    Pamella Pertostin Tanaysia Bhardwaj, MD, PhD [facility]  Contact via  [email or web address]  [facility] Office Info P: [phone]  F: [phone]

## 2019-04-11 NOTE — Progress Notes (Signed)
NAME:  Melvin Mills, MRN:  [id number], DOB:  03/09/1971, LOS: 3 ADMISSION DATE:  04/08/2019, CONSULTATION DATE: Apr 10, 2019 REFERRING MD: Dr. Darlyn ReadGrunzs, CHIEF COMPLAINT: Dyspnea  Brief History   48 year old male with no past medical history was admitted on Apr 08, 2019 with COVID pneumonia causing acute respiratory failure with hypoxemia.  He required intubation and mechanical ventilation on Apr 10, 2019.  Past Medical History  none  Significant Hospital Events   May 28 admitted May 30 transferred to ICU and intubated  Consults:  Pulmonary and critical care medicine  Procedures:  Endotracheal tube May 30 >   Significant Diagnostic Tests:    Micro Data:  May 28 SARS-CoV-2 positive May 28 blood culture no growth to date  Antimicrobials:  May 28 ceftriaxone >  May 29 azithromycin >  May 30 remdesivir >   Interim history/subjective:  Resting comfortably on ventilator this morning Making urine but requires in and out catheterization Fever overnight  Objective   Blood pressure (!) 118/94, pulse (!) 50, temperature 98.6 F (37 C), temperature source Axillary, resp. rate (!) 30, height 5\' 3"  (1.6 m), weight 73.6 kg, SpO2 100 %.    Vent Mode: PRVC FiO2 (%):  [40 %-100 %] 40 % Set Rate:  [30 bmp] 30 bmp Vt Set:  [450 mL-550 mL] 450 mL PEEP:  [12 cmH20-16 cmH20] 12 cmH20 Plateau Pressure:  [24 cmH20-34 cmH20] 24 cmH20   Intake/Output Summary (Last 24 hours) at 04/11/2019 [id number] Last data filed at 04/11/2019 0700 Gross per 24 hour  Intake 1805.57 ml  Output 700 ml  Net 1105.57 ml   Filed Weights   04/08/19 1706 04/11/19 0600  Weight: 75 kg 73.6 kg    Examination:  General:  In bed on vent HENT: NCAT ETT in place PULM: CTA B, vent supported breathing CV: RRR, no mgr GI: BS+, soft, nontender MSK: normal bulk and tone Neuro: sedated on vent    Resolved Hospital Problem list     Assessment & Plan:  COVID-19 pneumonia causing ARDS Continue full mechanical  ventilatory support with ARDS ventilator settings Target tidal volume 6 to 8 cc/kg ideal body weight Titrate PEEP and FiO2 to maintain PaO2 to FiO2 ratio greater than 150 (currently at target on May 31) Ventilator associated pneumonia prevention protocol Diurese as blood pressure allows Hold off on spontaneous breathing trial/wake up assessment with high ventilator needs as of May 31 Continue remdesivir Consent for convalescent plasma: start today Adjust dose of solumedrol to 1mg /kg IBW  Possible bacterial community-acquired pneumonia Monitor respiratory culture Continue broad-spectrum antibiotics as ordered  Need for sedation for mechanical ventilation RASS score 0 to -1 Continue fentanyl and propofol infusions   Best practice:  Diet: continue tube feeding Pain/Anxiety/Delirium protocol (if indicated): yes, target RASS score 0 to -1 VAP protocol (if indicated): Yes DVT prophylaxis lovenox GI prophylaxis: ppi Glucose control: per [facility] Mobility: bed rest Code Status: full Family Communication: updated at length today, daughter Disposition: remain in ICU  Labs   CBC: Recent Labs  Lab 04/08/19 0926 04/09/19 0500 04/10/19 0457 04/10/19 1729 04/11/19 0451  WBC 7.3 3.8* 6.1  --   --   NEUTROABS  --  2.7 3.9  --   --   HGB 15.1 13.8 14.4 12.9* 13.3  HCT 46.6 42.7 46.2 38.0* 39.0  MCV 81.9 84.1 84.5  --   --   PLT 87* 91* 119*  --   --     Basic Metabolic Panel: Recent Labs  Lab 04/08/19 0926 04/09/19 0500 04/10/19 0457 04/10/19 1729 04/11/19 0451  NA 141 139 138 138 138  K 3.4* 3.5 3.6 3.5 3.9  CL 102 106 102  --   --   CO2 28 23 27   --   --   GLUCOSE 129* 111* 116*  --   --   BUN 14 12 14   --   --   CREATININE 1.42* 1.10 1.22  --   --   CALCIUM 8.9 8.1* 8.4*  --   --    GFR: Estimated Creatinine Clearance: 65.2 mL/min (by C-G formula based on SCr of 1.22 mg/dL). Recent Labs  Lab 04/08/19 0926 04/08/19 1235 04/08/19 2030 04/09/19 0500 04/10/19 0457  04/10/19 1526  PROCALCITON  --   --  1.90  --   --  1.30  WBC 7.3  --   --  3.8* 6.1  --   LATICACIDVEN  --  1.1  --   --   --   --     Liver Function Tests: Recent Labs  Lab 04/08/19 0926 04/09/19 0500 04/10/19 0457  AST 69* 93* 171*  ALT 34 45* 98*  ALKPHOS 59 53 81  BILITOT 0.6 0.4 0.5  PROT 7.6 6.8 7.3  ALBUMIN 3.3* 3.0* 2.9*   Recent Labs  Lab 04/08/19 0926  LIPASE 21   No results for input(s): AMMONIA in the last 168 hours.  ABG    Component Value Date/Time   PHART 7.390 04/11/2019 0451   PCO2ART 46.4 04/11/2019 0451   PO2ART 90.0 04/11/2019 0451   HCO3 28.1 (H) 04/11/2019 0451   TCO2 29 04/11/2019 0451   O2SAT 97.0 04/11/2019 0451     Coagulation Profile: No results for input(s): INR, PROTIME in the last 168 hours.  Cardiac Enzymes: No results for input(s): CKTOTAL, CKMB, CKMBINDEX, TROPONINI in the last 168 hours.  HbA1C: No results found for: HGBA1C  CBG: Recent Labs  Lab 04/10/19 1630 04/10/19 1940  GLUCAP 167* 158*     Critical care time: 35 mintues    Heber [place], MD [place] P[facility]M Pager: [phone] Cell: [phone] If no response, call [phone]

## 2019-04-11 NOTE — Progress Notes (Signed)
Nutrition Follow-up  RD working remotely.   DOCUMENTATION CODES:   Not applicable  INTERVENTION:  - will d/c Ensure Enlive. - will order TF: Vital AF 1.2 @ 40 ml/hr to increase by 10 ml every 8 hours to reach goal rate of 70 ml/hr. - goal rate for TF + kcal from current propofol rate will provide 2254 kcal (103% estimated kcal need), 126 grams protein, and 1362 ml free water. - free water flush, if desired, to be per MD/NP.    NUTRITION DIAGNOSIS:   Increased nutrient needs related to acute illness(COVID-19) as evidenced by estimated needs. -ongoing  GOAL:   Patient will meet greater than or equal to 90% of their needs -unmet at this time  MONITOR:   Vent status, TF tolerance, Labs, Weight trends  REASON FOR ASSESSMENT:   Ventilator, Consult Enteral/tube feeding initiation and management  ASSESSMENT:   48 yo [place] male with no known PMH who was admitted with COVID-19 infection. He lives with several family members who are also sick.  Weight -1.4 kg/3 lb from 5/28-5/31. Estimated nutrition needs updated based on intubation and using today's weight (73.6 kg). Patient was intubated and OGT placed yesterday a little after 3 PM. No intakes documented from when patient was on Regular diet prior to intubation.   Per notes: ARDS protocol, COVID-19 positive with remdesivir ordered, CAP.   Patient is currently intubated on ventilator support MV: 13.7 L/min Temp (24hrs), Avg:100.2 F (37.9 C), Min:98 F (36.7 C), Max:102.5 F (39.2 C) Propofol: 9 ml/hr (238 kcal)  Medications reviewed; 100 mg colace BID, 80 mg solu-medrol TID, 15 ml liquid multivitamin per OGT/day, 200 mg remdesivir x1 dose 5/30, 100 mg remdesivir x1 dose/day x4 days starting 5/21. Labs reviewed; CBG: 207 mg/dl today, ionized Ca: 0.04 mmol/l. Drips; fentanyl @ , propofol @ 20 mcg/kg/min.     NUTRITION - FOCUSED PHYSICAL EXAM:  unable to complete while patient is at [facility].  Diet Order:   Diet Order             Diet NPO time specified  Diet effective now              EDUCATION NEEDS:   No education needs have been identified at this time  Skin:  Skin Assessment: Reviewed RN Assessment  Last BM:  5/30  Height:   Ht Readings from Last 1 Encounters:  04/08/19 5\' 3"  (1.6 m)    Weight:   Wt Readings from Last 1 Encounters:  04/11/19 73.6 kg    Ideal Body Weight:  56.4 kg  BMI:  Body mass index is 28.74 kg/m.  Estimated Nutritional Needs:   Kcal:  2194 kcal  Protein:  110-125 grams (1.5-1.7 grams/kg)  Fluid:  >/= 1.8 L      Trenton Gammon, MS, RD, LDN, [facility] Inpatient Clinical Dietitian Pager # [phone] After hours/weekend pager # [phone]

## 2019-04-11 NOTE — Progress Notes (Signed)
[facility] PCCM  On re-assessment, the patient indicates that he is short of breath while on pressure support. Hold off on extubation for now  Heber [place], MD [place] PCCM Pager: [phone] Cell: [phone] If no response, call [phone]

## 2019-04-11 NOTE — Progress Notes (Signed)
At 1830 patient had not voided since 11am. Bladder scanned for 300cc. Pt denies sensation to urinate. Continue to monitor. Melodye Ped

## 2019-04-11 NOTE — Progress Notes (Signed)
Spoke with patients daughter via facetime, updated on paitent's condition.

## 2019-04-11 NOTE — Research (Signed)
IMax Fickle, MD, consented Subject Melvin Mills (male, Date of Birth Sep 23, 1971, 48 y.o.) and with diagnosis of COVID-19, in the [facility] Expanded Access Program (EAP) Research Protocol for [employer] against COVID-19.  The consent took place under following circumstances.   Subject Capacity assessed by this investigator as:  Absence of emotional and mental capacity to consent (i.e. delirium, coma, ventilator).  Consent took place in the following setting(s):  Via video and/or telephone (Time of call: 1245)   The following were present for the consent process:  Investigator Daughter Hrim Relph who speaks fluent English   A copy of the cover letter and signed consent document was provided to subject/LAR.  The original signed consent document has been placed in the subject's physical chart and will be scanned into the electronic medical record upon discharge.  Statement of acknowledgement that the following was discussed with the subject/LAR:    1) Discussed the purpose of the research and procedures  2) Discussed risks and benefits and uncertainties of study participation 3) Discussed subject's responsibilities  4) Discussed the measures in place to maintain subject's confidentiality while a participant on the trial  5) Discussed alternatives to study participation.   6) Discussed study participation is voluntary and that the subject's care would not be jeopardized if they declined participation in the study.   7) Discussed freedom to withdraw at any time.   8) All subject/LAR questions were answered to their satisfaction.   9) In case of emergency consent, investigator agreed to discuss with subject/LAR at earliest available opportunity when the subject stabilizes and/or LAR can be located.     Final Investigator Signature  Cathleen Fears [place], [place] [phone]   Date: 04/11/2019 and 2:10 PM

## 2019-04-11 NOTE — Progress Notes (Signed)
RN spoke with pts daughter via facetime and updated. Also obtained consent for plasma from her as well.Melodye Ped

## 2019-04-11 NOTE — Progress Notes (Signed)
Called placed to patient's daughter. No answer at this time

## 2019-04-11 NOTE — Progress Notes (Signed)
[facility] P[facility]M  Family has provided consent for convalescent plasma, see note, consent on hard chart.  Awaiting result of type and screen before finalizing registration into clinical trial.  Heber Hildreth, MD [place] PCCM Pager: [phone] Cell: [phone] If no response, call [phone]

## 2019-04-11 NOTE — Progress Notes (Signed)
Pt has not voided since I/O cath at 10pm. Bladder scanned and I/O cath for 700cc amber urine. Explained procedure via translator ipad.    Was able to reach pts daughter and update phone numbers. Pts daughter aware of events of last night. MD to call her later. Melvin Mills

## 2019-04-12 ENCOUNTER — Inpatient Hospital Stay (HOSPITAL_COMMUNITY): Payer: BC Managed Care – PPO

## 2019-04-12 LAB — POCT I-STAT 7, (LYTES, BLD GAS, ICA,H+H)
Acid-Base Excess: 2 mmol/L (ref 0.0–2.0)
Bicarbonate: 27.3 mmol/L (ref 20.0–28.0)
Calcium, Ion: 1.18 mmol/L (ref 1.15–1.40)
HCT: 37 % — ABNORMAL LOW (ref 39.0–52.0)
Hemoglobin: 12.6 g/dL — ABNORMAL LOW (ref 13.0–17.0)
O2 Saturation: 87 %
Patient temperature: 97.7
Potassium: 3.7 mmol/L (ref 3.5–5.1)
Sodium: 139 mmol/L (ref 135–145)
TCO2: 29 mmol/L (ref 22–32)
pCO2 arterial: 44.3 mmHg (ref 32.0–48.0)
pH, Arterial: 7.395 (ref 7.350–7.450)
pO2, Arterial: 52 mmHg — ABNORMAL LOW (ref 83.0–108.0)

## 2019-04-12 LAB — CBC WITH DIFFERENTIAL/PLATELET
Abs Immature Granulocytes: 0.06 10*3/uL (ref 0.00–0.07)
Basophils Absolute: 0 10*3/uL (ref 0.0–0.1)
Basophils Relative: 0 %
Eosinophils Absolute: 0 10*3/uL (ref 0.0–0.5)
Eosinophils Relative: 0 %
HCT: 41.1 % (ref 39.0–52.0)
Hemoglobin: 13 g/dL (ref 13.0–17.0)
Immature Granulocytes: 1 %
Lymphocytes Relative: 4 %
Lymphs Abs: 0.3 10*3/uL — ABNORMAL LOW (ref 0.7–4.0)
MCH: 26.6 pg (ref 26.0–34.0)
MCHC: 31.6 g/dL (ref 30.0–36.0)
MCV: 84.2 fL (ref 80.0–100.0)
Monocytes Absolute: 0.4 10*3/uL (ref 0.1–1.0)
Monocytes Relative: 4 %
Neutro Abs: 8.1 10*3/uL — ABNORMAL HIGH (ref 1.7–7.7)
Neutrophils Relative %: 91 %
Platelets: 149 10*3/uL — ABNORMAL LOW (ref 150–400)
RBC: 4.88 MIL/uL (ref 4.22–5.81)
RDW: 14.2 % (ref 11.5–15.5)
WBC: 8.9 10*3/uL (ref 4.0–10.5)
nRBC: 0 % (ref 0.0–0.2)

## 2019-04-12 LAB — GLUCOSE, CAPILLARY
Glucose-Capillary: 173 mg/dL — ABNORMAL HIGH (ref 70–99)
Glucose-Capillary: 187 mg/dL — ABNORMAL HIGH (ref 70–99)
Glucose-Capillary: 194 mg/dL — ABNORMAL HIGH (ref 70–99)
Glucose-Capillary: 211 mg/dL — ABNORMAL HIGH (ref 70–99)
Glucose-Capillary: 236 mg/dL — ABNORMAL HIGH (ref 70–99)
Glucose-Capillary: 259 mg/dL — ABNORMAL HIGH (ref 70–99)

## 2019-04-12 LAB — PHOSPHORUS: Phosphorus: 2.6 mg/dL (ref 2.5–4.6)

## 2019-04-12 LAB — C-REACTIVE PROTEIN: CRP: 13.8 mg/dL — ABNORMAL HIGH (ref <1.0)

## 2019-04-12 LAB — COMPREHENSIVE METABOLIC PANEL
ALT: 69 U/L — ABNORMAL HIGH (ref 0–44)
AST: 67 U/L — ABNORMAL HIGH (ref 15–41)
Albumin: 2.4 g/dL — ABNORMAL LOW (ref 3.5–5.0)
Alkaline Phosphatase: 70 U/L (ref 38–126)
Anion gap: 9 (ref 5–15)
BUN: 34 mg/dL — ABNORMAL HIGH (ref 6–20)
CO2: 24 mmol/L (ref 22–32)
Calcium: 7.9 mg/dL — ABNORMAL LOW (ref 8.9–10.3)
Chloride: 104 mmol/L (ref 98–111)
Creatinine, Ser: 0.96 mg/dL (ref 0.61–1.24)
GFR calc Af Amer: >60 mL/min (ref >60)
GFR calc non Af Amer: >60 mL/min (ref >60)
Glucose, Bld: 208 mg/dL — ABNORMAL HIGH (ref 70–99)
Potassium: 4.7 mmol/L (ref 3.5–5.1)
Sodium: 137 mmol/L (ref 135–145)
Total Bilirubin: 0.4 mg/dL (ref 0.3–1.2)
Total Protein: 6.2 g/dL — ABNORMAL LOW (ref 6.5–8.1)

## 2019-04-12 LAB — FERRITIN: Ferritin: 1740 ng/mL — ABNORMAL HIGH (ref 24–336)

## 2019-04-12 LAB — FIBRINOGEN: Fibrinogen: 772 mg/dL — ABNORMAL HIGH (ref 210–475)

## 2019-04-12 LAB — D-DIMER, QUANTITATIVE: D-Dimer, Quant: 1.14 ug/mL-FEU — ABNORMAL HIGH (ref 0.00–0.50)

## 2019-04-12 LAB — LACTATE DEHYDROGENASE: LDH: 515 U/L — ABNORMAL HIGH (ref 98–192)

## 2019-04-12 LAB — MAGNESIUM: Magnesium: 2.7 mg/dL — ABNORMAL HIGH (ref 1.7–2.4)

## 2019-04-12 MED ORDER — FENTANYL BOLUS VIA INFUSION
50.0000 ug | INTRAVENOUS | Status: DC | PRN
  Administered 2019-04-13 – 2019-04-15 (×3): 50 ug via INTRAVENOUS
  Filled 2019-04-12: qty 50

## 2019-04-12 MED ORDER — DEXMEDETOMIDINE HCL IN NACL 400 MCG/100ML IV SOLN
0.0000 ug/kg/h | INTRAVENOUS | Status: AC
  Administered 2019-04-12 – 2019-04-13 (×2): 0.4 ug/kg/h via INTRAVENOUS
  Filled 2019-04-12 (×4): qty 100

## 2019-04-12 MED ORDER — MIDAZOLAM HCL 2 MG/2ML IJ SOLN
4.0000 mg | Freq: Once | INTRAMUSCULAR | Status: AC
  Administered 2019-04-12: 12:00:00 4 mg via INTRAVENOUS
  Filled 2019-04-12: qty 4

## 2019-04-12 MED ORDER — FUROSEMIDE 10 MG/ML IJ SOLN
40.0000 mg | Freq: Four times a day (QID) | INTRAMUSCULAR | Status: AC
  Administered 2019-04-12 (×2): 40 mg via INTRAVENOUS
  Filled 2019-04-12 (×2): qty 4

## 2019-04-12 MED ORDER — FENTANYL CITRATE (PF) 100 MCG/2ML IJ SOLN
100.0000 ug | Freq: Once | INTRAMUSCULAR | Status: AC
  Administered 2019-04-12: 12:00:00 100 ug via INTRAVENOUS
  Filled 2019-04-12: qty 2

## 2019-04-12 MED ORDER — ETOMIDATE 2 MG/ML IV SOLN
20.0000 mg | Freq: Once | INTRAVENOUS | Status: AC
  Administered 2019-04-12: 12:00:00 20 mg via INTRAVENOUS
  Filled 2019-04-12: qty 10

## 2019-04-12 MED ORDER — FENTANYL CITRATE (PF) 100 MCG/2ML IJ SOLN
50.0000 ug | Freq: Once | INTRAMUSCULAR | Status: AC
  Administered 2019-04-12: 12:00:00 50 ug via INTRAVENOUS

## 2019-04-12 MED ORDER — AMIODARONE HCL IN DEXTROSE 360-4.14 MG/200ML-% IV SOLN
60.0000 mg/h | INTRAVENOUS | Status: AC
  Administered 2019-04-12: 12:00:00 60 mg/h via INTRAVENOUS
  Filled 2019-04-12: qty 200

## 2019-04-12 MED ORDER — INSULIN ASPART 100 UNIT/ML ~~LOC~~ SOLN
2.0000 [IU] | SUBCUTANEOUS | Status: DC
  Administered 2019-04-12 – 2019-04-16 (×22): 2 [IU] via SUBCUTANEOUS

## 2019-04-12 MED ORDER — AMIODARONE HCL IN DEXTROSE 360-4.14 MG/200ML-% IV SOLN: 30.0000 mg/h | INTRAVENOUS | Status: DC

## 2019-04-12 MED ORDER — AMIODARONE IV BOLUS ONLY 150 MG/100ML
150.0000 mg | Freq: Once | INTRAVENOUS | Status: AC
  Administered 2019-04-12: 12:00:00 150 mg via INTRAVENOUS
  Filled 2019-04-12: qty 100

## 2019-04-12 MED ORDER — FENTANYL 2500MCG IN NS 250ML (10MCG/ML) PREMIX INFUSION
50.0000 ug/h | INTRAVENOUS | Status: DC
  Administered 2019-04-12: 100 ug/h via INTRAVENOUS
  Administered 2019-04-13: 50 ug/h via INTRAVENOUS
  Administered 2019-04-15: 200 ug/h via INTRAVENOUS
  Filled 2019-04-12 (×2): qty 250

## 2019-04-12 MED ORDER — ROCURONIUM BROMIDE 10 MG/ML (PF) SYRINGE
PREFILLED_SYRINGE | INTRAVENOUS | Status: AC
  Administered 2019-04-12: 12:00:00 100 mg
  Filled 2019-04-12: qty 10

## 2019-04-12 MED ORDER — ROCURONIUM BROMIDE 50 MG/5ML IV SOLN
100.0000 mg | Freq: Once | INTRAVENOUS | Status: AC
  Filled 2019-04-12: qty 10

## 2019-04-12 NOTE — Progress Notes (Signed)
[facility] P[facility]M  Extubated, immediately developed atrial fibrillation with RVR, hypxoemia to 79% on 100% FiO2. Re-intubated Started amiodarone infusion Restarted sedation and ventilatory protocol  Heber Jewett, MD  PCCM Pager: [phone] Cell: [phone] If no response, call [phone]

## 2019-04-12 NOTE — Progress Notes (Signed)
NAME:  Melvin Mills, MRN:  [id number], DOB:  08/01/1971, LOS: 4 ADMISSION DATE:  04/08/2019, CONSULTATION DATE: Apr 10, 2019 REFERRING MD: Dr. Darlyn ReadGrunzs, CHIEF COMPLAINT: Dyspnea  Brief History   48 year old male with no past medical history was admitted on Apr 08, 2019 with COVID pneumonia causing acute respiratory failure with hypoxemia.  He required intubation and mechanical ventilation on Apr 10, 2019.  Past Medical History  none  Significant Hospital Events   May 28 admitted May 30 transferred to ICU and intubated  Consults:  Pulmonary and critical care medicine  Procedures:  Endotracheal tube May 30 >   Significant Diagnostic Tests:    Micro Data:  May 28 SARS-CoV-2 positive May 28 blood culture no growth to date  Antimicrobials/COVID treatment  May 28 ceftriaxone >  May 29 azithromycin >  May 30 remdesivir >  June 1 early a.m. administered convalescent plasma  Interim history/subjective:  Resting comfortably on ventilator this morning Making urine but requires in and out catheterization Fever overnight  Objective   Blood pressure (!) 137/93, pulse 64, temperature 97.7 F (36.5 C), temperature source Oral, resp. rate (!) 25, height 5\' 3"  (1.6 m), weight 75.6 kg, SpO2 96 %.    Vent Mode: CPAP;PSV FiO2 (%):  [30 %-50 %] 40 % Set Rate:  [30 bmp] 30 bmp Vt Set:  [450 mL] 450 mL PEEP:  [5 cmH20-8 cmH20] 5 cmH20 Pressure Support:  [5 cmH20] 5 cmH20 Plateau Pressure:  [18 cmH20-22 cmH20] 22 cmH20   Intake/Output Summary (Last 24 hours) at 04/12/2019 1054 Last data filed at 04/12/2019 1033 Gross per 24 hour  Intake 2460.46 ml  Output 1015 ml  Net 1445.46 ml   Filed Weights   04/08/19 1706 04/11/19 0600 04/12/19 0500  Weight: 75 kg 73.6 kg 75.6 kg    Examination:  General:  In bed on vent HENT: NCAT ETT in place PULM: CTA B, vent supported breathing CV: RRR, no mgr GI: BS+, soft, nontender MSK: normal bulk and tone Neuro: Awake, alert, interactive with  use of translator     Resolved Hospital Problem list     Assessment & Plan:  COVID-19 pneumonia causing ARDS Extubation today Monitor O2 saturation, suspect he will need high FiO2 for the next several days Diuresis today Continue remdesivir Continue Solu-Medrol 1 mg/kg ideal body weight Close monitoring in ICU after extubation  Possible bacterial community-acquired pneumonia Continue antibiotics for now  Need for sedation for mechanical ventilation Discontinue sedation protocol   Best practice:  Diet: continue tube feeding Pain/Anxiety/Delirium protocol (if indicated): yes, target RASS score 0 to -1 VAP protocol (if indicated): Yes DVT prophylaxis lovenox GI prophylaxis: ppi Glucose control: per TRH Mobility: bed rest Code Status: full Family Communication: updated at length today, daughter Disposition: remain in ICU  Labs   CBC: Recent Labs  Lab 04/08/19 0926 04/09/19 0500 04/10/19 0457 04/10/19 1729 04/11/19 0451 04/11/19 0540 04/12/19 0438 04/12/19 0530  WBC 7.3 3.8* 6.1  --   --  6.5  --  8.9  NEUTROABS  --  2.7 3.9  --   --  5.7  --  8.1*  HGB 15.1 13.8 14.4 12.9* 13.3 13.8 12.6* 13.0  HCT 46.6 42.7 46.2 38.0* 39.0 42.3 37.0* 41.1  MCV 81.9 84.1 84.5  --   --  83.8  --  84.2  PLT 87* 91* 119*  --   --  124*  --  149*    Basic Metabolic Panel: Recent Labs  Lab 04/08/19 0926  04/09/19 0500 04/10/19 0457 04/10/19 1729 04/11/19 0451 04/11/19 0540 04/11/19 1710 04/12/19 0438 04/12/19 0500 04/12/19 0530  NA 141 139 138 138 138 138  --  139  --  137  K 3.4* 3.5 3.6 3.5 3.9 3.9  --  3.7  --  4.7  CL 102 106 102  --   --  101  --   --   --  104  CO2 28 23 27   --   --  25  --   --   --  24  GLUCOSE 129* 111* 116*  --   --  222*  --   --   --  208*  BUN 14 12 14   --   --  30*  --   --   --  34*  CREATININE 1.42* 1.10 1.22  --   --  1.28*  --   --   --  0.96  CALCIUM 8.9 8.1* 8.4*  --   --  8.0*  --   --   --  7.9*  MG  --   --   --   --   --  2.3  2.5*  --  2.7*  --   PHOS  --   --   --   --   --  3.8 3.1  --  2.6  --    GFR: Estimated Creatinine Clearance: 83.9 mL/min (by C-G formula based on SCr of 0.96 mg/dL). Recent Labs  Lab 04/08/19 1235 04/08/19 2030 04/09/19 0500 04/10/19 0457 04/10/19 1526 04/11/19 0540 04/12/19 0530  PROCALCITON  --  1.90  --   --  1.30  --   --   WBC  --   --  3.8* 6.1  --  6.5 8.9  LATICACIDVEN 1.1  --   --   --   --   --   --     Liver Function Tests: Recent Labs  Lab 04/08/19 0926 04/09/19 0500 04/10/19 0457 04/11/19 0540 04/12/19 0530  AST 69* 93* 171* 108* 67*  ALT 34 45* 98* 90* 69*  ALKPHOS 59 53 81 73 70  BILITOT 0.6 0.4 0.5 0.4 0.4  PROT 7.6 6.8 7.3 7.0 6.2*  ALBUMIN 3.3* 3.0* 2.9* 2.6* 2.4*   Recent Labs  Lab 04/08/19 0926  LIPASE 21   No results for input(s): AMMONIA in the last 168 hours.  ABG    Component Value Date/Time   PHART 7.395 04/12/2019 0438   PCO2ART 44.3 04/12/2019 0438   PO2ART 52.0 (L) 04/12/2019 0438   HCO3 27.3 04/12/2019 0438   TCO2 29 04/12/2019 0438   O2SAT 87.0 04/12/2019 0438     Coagulation Profile: No results for input(s): INR, PROTIME in the last 168 hours.  Cardiac Enzymes: No results for input(s): CKTOTAL, CKMB, CKMBINDEX, TROPONINI in the last 168 hours.  HbA1C: No results found for: HGBA1C  CBG: Recent Labs  Lab 04/11/19 1648 04/11/19 1953 04/12/19 0032 04/12/19 0358 04/12/19 0825  GLUCAP 204* 205* 211* 187* 194*     Critical care time: 35 mintues    Heber [place], MD [facility] Pager: [phone] Cell: [phone] If no response, call [phone]

## 2019-04-12 NOTE — Progress Notes (Signed)
eLink Physician-Brief Progress Note Patient Name: Melvin Mills DOB: 1971-05-20 MRN: [id number]   Date of Service  04/12/2019  HPI/Events of Note  AFIB with RVR - HR now = 60's to 80's. Currently on a Precedex IV infusion and Amiodarone IV infusion is now off d/t controlled HR.   eICU Interventions  Will order: 1. Will leave Amiodarone IV infusion off for now.  2. BMP and Mg++ level now.      Intervention Category Major Interventions: Arrhythmia - evaluation and management  Maddison Kilner Eugene 04/12/2019, 8:56 PM

## 2019-04-12 NOTE — Progress Notes (Signed)
Order received for extubation.  Immediately upon extubation, patient started desatting into the low 70s.  Patient initially placed on 15 l [place]; patient was not tolerating that so I placed patient on 50 liter, heated high flow at 100%.  Sats did not improved on this, MD made decision to re-intubate.  Intubated with 7.5 ETT secured at 24 at the lip.  Placed on vent settings from previous intubation.

## 2019-04-12 NOTE — Procedures (Signed)
Intubation Procedure Note Melvin Mills [id number] 1971/05/10  Procedure: Intubation Indications: Respiratory insufficiency  Procedure Details Consent: Unable to obtain consent because of emergent medical necessity. Time Out: Verified patient identification, verified procedure, site/side was marked, verified correct patient position, special equipment/implants available, medications/allergies/relevent history reviewed, required imaging and test results available.  Performed  Drugs Versed 17m, Fentanyl 530m IV, Etomidate 2067mV, Rocuronium 11m36m DL x 1 with MAC 4 blade Grade 1 view 7.5 ET tube passed through cords under direct visualization Placement confirmed with bilateral breath sounds, positive EtCO2 change and smoke in tube   Evaluation Hemodynamic Status: BP stable throughout; O2 sats: transiently fell during during procedure Patient's Current Condition: stable Complications: No apparent complications Patient did tolerate procedure well. Chest X-ray ordered to verify placement.  CXR: pending.   DougSimonne Maffucci/2020

## 2019-04-12 NOTE — Progress Notes (Signed)
Inpatient Diabetes Program Recommendations  AACE/ADA: New Consensus Statement on Inpatient Glycemic Control (2015)  Target Ranges:  Prepandial:   less than 140 mg/dL      Peak postprandial:   less than 180 mg/dL (1-2 hours)      Critically ill patients:  140 - 180 mg/dL   Lab Results  Component Value Date   GLUCAP 259 (H) 04/12/2019    Review of Glycemic Control Results for Melvin Mills, Melvin Mills (MRN [id number]) as of 04/12/2019 15:20  Ref. Range 04/11/2019 19:53 04/12/2019 00:32 04/12/2019 03:58 04/12/2019 08:25 04/12/2019 13:34  Glucose-Capillary Latest Ref Range: 70 - 99 mg/dL 423 (H) 953 (H) 202 (H) 194 (H) 259 (H)    Inpatient Diabetes Program Recommendations:   CBGs elevating with adding tube feeding. Consider adding Novolog 2 units tube feed coverage q 4 hrs and hold if tube feed held or stopped for any reason.  Thank you, Melvin Mills. Melvin Crompton, RN, MSN, CDE  Diabetes Coordinator Inpatient Glycemic Control Team Team Pager [phone] (8am-5pm) 04/12/2019 3:22 PM

## 2019-04-12 NOTE — Progress Notes (Signed)
[facility]  I called his daughter to update her today about his situation.  Questions answered.  Heber Gervais, MD [facility] Pager: [phone] If no response, call [phone]

## 2019-04-12 NOTE — Progress Notes (Signed)
Spoke with Dr. Elvera Lennox and he advised to start Amieo at 16.67 at 2000.

## 2019-04-12 NOTE — Progress Notes (Signed)
Spoke to patient's niece, Almira Bar and gave her updates on patient. She was able to face time with patient. Bren said that she would pass all updates on to patient's family.

## 2019-04-12 NOTE — Progress Notes (Signed)
SLP Cancellation Note  Patient Details Name: Melvin Mills MRN: [id number] DOB: 1971/03/02   Cancelled treatment:       Reason Eval/Treat Not Completed: Patient not medically ready. Per review of chart, pt was extubated but required immediate reintubation. Will follow along to see when he may be ready for swallow evaluation.    Virl Axe Jessey Huyett 04/12/2019, 1:06 PM  Ivar Drape, M.A. CCC-SLP Acute Herbalist [phone] Office [phone]

## 2019-04-12 NOTE — Progress Notes (Signed)
PT Cancellation Note  Patient Details Name: Melvin Mills MRN: [id number] DOB: Dec 31, 1970   Cancelled Treatment:    Reason Eval/Treat Not Completed: Medical issues which prohibited therapy.  Extubation with re-intubation noted.  PT will not see today, but will follow along acutely.  Thanks,  Rollene Rotunda. Haddon Fyfe, PT, DPT  Acute Rehabilitation [phone] pager [phone]) [phone] office     Lurena Joiner B Lekeya Rollings 04/12/2019, 1:08 PM

## 2019-04-12 NOTE — Progress Notes (Addendum)
PROGRESS NOTE  Drayton Tieu ZOX:[id number] DOB: 1971-10-06 DOA: 04/08/2019 PCP: Patient, No Pcp Per   LOS: 4 days   Brief Narrative / Interim history: 48 year old male with no medical history presents to the hospital and was admitted on 04/08/2019 with about a week worth of fevers, shortness of breath, productive cough, found to have tachypnea, chest x-ray infiltrate.  He was found to be positive for COVID-19.  Patient was initially started on antibiotics, was in regular floor however had progressive hypoxia requiring intubation on 5/30  Subjective: -Alert on the vent, appears calm  Assessment & Plan: Active Problems:   COVID-19  Principal Problem Acute Hypoxic Respiratory Failure due to Covid-19 Viral Illness /ARDS -Patient had rapidly progressive respiratory failure since admission requiring intubation on 04/10/2019.  He was moved to the ICU and critical care was consulted -Patient was started on ceftriaxone, azithromycin and will continue for possible CAP -Given concern for sepsis he was not started on Actemra -Patient started on Remdesivir on 5/30, continue  Vent Mode: CPAP;PSV FiO2 (%):  [30 %-50 %] 40 % Set Rate:  [30 bmp] 30 bmp Vt Set:  [450 mL] 450 mL PEEP:  [5 cmH20] 5 cmH20 Pressure Support:  [5 cmH20] 5 cmH20 Plateau Pressure:  [18 cmH20-22 cmH20] 22 cmH20    -Patient alert this morning, minimal vent settings on pressure support 5/5, following commands, plan for extubation today with ongoing ICU monitoring -?  Urinary retention, possibly related to sedation, had to have I&O cath x2 since last night.  We will give IV Lasix to assist with his ARDS and monitor urine output  COVID-19 Labs  Recent Labs    04/10/19 0457 04/11/19 0540 04/12/19 0530  DDIMER 0.75* 0.90* 1.14*  FERRITIN 1,475* 1,750* 1,740*  LDH 552* 532* 515*  CRP 18.9* 25.3* 13.8*    Lab Results  Component Value Date   SARSCOV2NAA POSITIVE (A) 04/08/2019   Scheduled Meds: . sodium chloride    Intravenous Once  . chlorhexidine  15 mL Mouth/Throat BID  . Chlorhexidine Gluconate Cloth  6 each Topical Daily  . dextromethorphan  30 mg Per Tube BID   And  . guaiFENesin  15 mL Per Tube Q6H  . docusate  100 mg Per Tube BID  . enoxaparin (LOVENOX) injection  40 mg Subcutaneous BID  . etomidate  20 mg Intravenous Once  . famotidine  20 mg Oral BID  . fentaNYL (SUBLIMAZE) injection  100 mcg Intravenous Once  . fentaNYL (SUBLIMAZE) injection  50 mcg Intravenous Once  . furosemide  40 mg Intravenous Q6H  . insulin aspart  0-9 Units Subcutaneous Q4H  . mouth rinse  15 mL Mouth Rinse 10 times per day  . methylPREDNISolone (SOLU-MEDROL) injection  40 mg Intravenous Q12H  . midazolam  4 mg Intravenous Once  . multivitamin  15 mL Per Tube Daily  . rocuronium  100 mg Intravenous Once  . rocuronium bromide      . sodium chloride flush  3 mL Intravenous Q12H   Continuous Infusions: . amiodarone    . amiodarone    . dexmedetomidine (PRECEDEX) IV infusion    . feeding supplement (VITAL AF 1.2 CAL) 50 mL/hr at 04/12/19 0400  . fentaNYL infusion INTRAVENOUS    . remdesivir 100 mg in NS 250 mL 100 mg (04/12/19 0837)   PRN Meds:.acetaminophen (TYLENOL) oral liquid 160 mg/5 mL, bisacodyl, fentaNYL, ondansetron **OR** ondansetron (ZOFRAN) IV, oxyCODONE, polyethylene glycol  DVT prophylaxis: Lovenox Code Status: Full code Family Communication: PCCM discussed  with family Disposition Plan: Remain in ICU  Consultants:   Critical care  Procedures:   None   Antimicrobials:  Ceftriaxone, azithromycin 5/28 --  Remdesivir 5/29 --  Objective: Vitals:   04/12/19 0615 04/12/19 0700 04/12/19 0750 04/12/19 0812  BP: 105/78 107/77 134/89 (!) 137/93  Pulse: (!) 51 (!) 56 68 64  Resp: (!) 30 (!) 30 (!) 29 (!) 25  Temp:      TempSrc:      SpO2: 95% 97% 96%   Weight:      Height:        Intake/Output Summary (Last 24 hours) at 04/12/2019 1226 Last data filed at 04/12/2019 1033 Gross per 24  hour  Intake 2460.46 ml  Output 1015 ml  Net 1445.46 ml   Filed Weights   04/08/19 1706 04/11/19 0600 04/12/19 0500  Weight: 75 kg 73.6 kg 75.6 kg    Examination:  Constitutional: Alert, following commands, appears comfortable Eyes: No scleral icterus ENMT: Moist mucous membranes, ETT in place Neck: normal, supple Respiratory: Mostly clear on anterior auscultation, no wheezing, no crackles Cardiovascular: Regular rate and rhythm, no murmurs.  No peripheral edema Abdomen: Soft, nontender, nondistended, positive bowel sounds Musculoskeletal: no clubbing / cyanosis.  Skin: no rashes Neurologic: Appears alert, moves all 4 extremities independently   Data Reviewed: I have independently reviewed following labs and imaging studies   CBC: Recent Labs  Lab 04/08/19 0926 04/09/19 0500 04/10/19 0457 04/10/19 1729 04/11/19 0451 04/11/19 0540 04/12/19 0438 04/12/19 0530  WBC 7.3 3.8* 6.1  --   --  6.5  --  8.9  NEUTROABS  --  2.7 3.9  --   --  5.7  --  8.1*  HGB 15.1 13.8 14.4 12.9* 13.3 13.8 12.6* 13.0  HCT 46.6 42.7 46.2 38.0* 39.0 42.3 37.0* 41.1  MCV 81.9 84.1 84.5  --   --  83.8  --  84.2  PLT 87* 91* 119*  --   --  124*  --  149*   Basic Metabolic Panel: Recent Labs  Lab 04/08/19 0926 04/09/19 0500 04/10/19 0457 04/10/19 1729 04/11/19 0451 04/11/19 0540 04/11/19 1710 04/12/19 0438 04/12/19 0500 04/12/19 0530  NA 141 139 138 138 138 138  --  139  --  137  K 3.4* 3.5 3.6 3.5 3.9 3.9  --  3.7  --  4.7  CL 102 106 102  --   --  101  --   --   --  104  CO2 --   --  25  --   --   --  24  GLUCOSE 129* 111* 116*  --   --  222*  --   --   --  208*  BUN --   --  30*  --   --   --  34*  CREATININE 1.42* 1.10 1.22  --   --  1.28*  --   --   --  0.96  CALCIUM 8.9 8.1* 8.4*  --   --  8.0*  --   --   --  7.9*  MG  --   --   --   --   --  2.3 2.5*  --  2.7*  --   PHOS  --   --   --   --   --  3.8 3.1  --  2.6  --    GFR: Estimated Creatinine  Clearance: 83.9 mL/min (by C-G  formula based on SCr of 0.96 mg/dL). Liver Function Tests: Recent Labs  Lab 04/08/19 0926 04/09/19 0500 04/10/19 0457 04/11/19 0540 04/12/19 0530  AST 69* 93* 171* 108* 67*  ALT 34 45* 98* 90* 69*  ALKPHOS 59 53 81 73 70  BILITOT 0.6 0.4 0.5 0.4 0.4  PROT 7.6 6.8 7.3 7.0 6.2*  ALBUMIN 3.3* 3.0* 2.9* 2.6* 2.4*   Recent Labs  Lab 04/08/19 0926  LIPASE 21   No results for input(s): AMMONIA in the last 168 hours. Coagulation Profile: No results for input(s): INR, PROTIME in the last 168 hours. Cardiac Enzymes: No results for input(s): CKTOTAL, CKMB, CKMBINDEX, TROPONINI in the last 168 hours. BNP (last 3 results) No results for input(s): PROBNP in the last 8760 hours. HbA1C: No results for input(s): HGBA1C in the last 72 hours. CBG: Recent Labs  Lab 04/11/19 1648 04/11/19 1953 04/12/19 0032 04/12/19 0358 04/12/19 0825  GLUCAP 204* 205* 211* 187* 194*   Lipid Profile: Recent Labs    04/11/19 0540 04/11/19 1710  TRIG 127 130   Thyroid Function Tests: No results for input(s): TSH, T4TOTAL, FREET4, T3FREE, THYROIDAB in the last 72 hours. Anemia Panel: Recent Labs    04/11/19 0540 04/12/19 0530  FERRITIN 1,750* 1,740*   Urine analysis: No results found for: COLORURINE, APPEARANCEUR, LABSPEC, PHURINE, GLUCOSEU, HGBUR, BILIRUBINUR, KETONESUR, PROTEINUR, UROBILINOGEN, NITRITE, LEUKOCYTESUR Sepsis Labs: Invalid input(s): PROCALCITONIN, LACTICIDVEN  Recent Results (from the past 240 hour(s))  SARS Coronavirus 2 (CEPHEID- Performed in [facility] hospital lab), Hosp Order     Status: Abnormal   Collection Time: 04/08/19 10:23 AM  Result Value Ref Range Status   SARS Coronavirus 2 POSITIVE (A) NEGATIVE Final    Comment: RESULT CALLED TO, READ BACK BY AND VERIFIED WITH: Duanne Guess RN 12:10 04/08/19 ([email or web address]) (NOTE) If result is NEGATIVE SARS-CoV-2 target nucleic acids are NOT DETECTED. The SARS-CoV-2 RNA is generally detectable in  upper and lower  respiratory specimens during the acute phase of infection. The lowest  concentration of SARS-CoV-2 viral copies this assay can detect is 250  copies / mL. A negative result does not preclude SARS-CoV-2 infection  and should not be used as the sole basis for treatment or other  patient management decisions.  A negative result may occur with  improper specimen collection / handling, submission of specimen other  than nasopharyngeal swab, presence of viral mutation(s) within the  areas targeted by this assay, and inadequate number of viral copies  (<250 copies / mL). A negative result must be combined with clinical  observations, patient history, and epidemiological information. If result is POSITIVE SARS-CoV-2 target nucleic acids are DETECTED.  The SARS-CoV-2 RNA is generally detectable in upper and lower  respiratory specimens during the acute phase of infection.  Positive  results are indicative of active infection with SARS-CoV-2.  Clinical  correlation with patient history and other diagnostic information is  necessary to determine patient infection status.  Positive results do  not rule out bacterial infection or co-infection with other viruses. If result is PRESUMPTIVE POSTIVE SARS-CoV-2 nucleic acids MAY BE PRESENT.   A presumptive positive result was obtained on the submitted specimen  and confirmed on repeat testing.  While 2019 novel coronavirus  (SARS-CoV-2) nucleic acids may be present in the submitted sample  additional confirmatory testing may be necessary for epidemiological  and / or clinical management purposes  to differentiate between  SARS-CoV-2 and other Sarbecovirus currently known to infect humans.  If clinically indicated additional testing  with an alternate test  methodology [phone])  is advised. The SARS-CoV-2 RNA is generally  detectable in upper and lower respiratory specimens during the acute  phase of infection. The expected result is  Negative. Fact Sheet for Patients:  BoilerBrush.com.cy Fact Sheet for Healthcare Providers: https://pope.com/ This test is not yet approved or cleared by the [place] FDA and has been authorized for detection and/or diagnosis of SARS-CoV-2 by FDA under an Emergency Use Authorization (EUA).  This EUA will remain in effect (meaning this test can be used) for the duration of the COVID-19 declaration under Section 564(b)(1) of the Act, 21 U.S.C. section 360bbb-3(b)(1), unless the authorization is terminated or revoked sooner. Performed at [facility], [address], [place], [place] [address]   Blood culture (routine x 2)     Status: None (Preliminary result)   Collection Time: 04/08/19 11:15 AM  Result Value Ref Range Status   Specimen Description BLOOD RIGHT ANTECUBITAL  Final   Special Requests   Final    BOTTLES DRAWN AEROBIC AND ANAEROBIC Blood Culture adequate volume   Culture   Final    NO GROWTH 4 DAYS Performed at [facility], [address] [place], [place] [address]    Report Status PENDING  Incomplete  Expectorated sputum assessment w rflx to resp cult     Status: None   Collection Time: 04/10/19  3:07 PM  Result Value Ref Range Status   Specimen Description SPUTUM  Final   Special Requests NONE  Final   Sputum evaluation   Final    THIS SPECIMEN IS ACCEPTABLE FOR SPUTUM CULTURE Performed at [facility], [address] [place], [place] [address]    Report Status 04/11/2019 FINAL  Final  Culture, respiratory     Status: None (Preliminary result)   Collection Time: 04/10/19  3:07 PM  Result Value Ref Range Status   Specimen Description   Final    SPUTUM Performed at [facility], [address], [place], [place] [address]    Special Requests   Final    NONE Reflexed from [id number] Performed at [facility], [address], [place], [place]  [address]    Gram Stain   Final    MODERATE WBC PRESENT, PREDOMINANTLY PMN NO ORGANISMS SEEN    Culture   Final    NO GROWTH < 24 HOURS Performed at [facility], [address], [place], [place] [address]    Report Status PENDING  Incomplete      Radiology Studies: Dg Chest Port 1 View  Result Date: 04/11/2019 CLINICAL DATA:  COVID, ETT EXAM: PORTABLE CHEST 1 VIEW COMPARISON:  04/10/2019 FINDINGS: Multifocal patchy airspace opacities in the lungs bilaterally, with sparing of the left lung base, grossly unchanged. No pleural effusion or pneumothorax. Endotracheal tube terminates 6 cm above the carina. Enteric tube courses below the diaphragm. IMPRESSION: Endotracheal tube terminates 6 cm above the carina. Stable multifocal pneumonia. Electronically Signed   By: Charline Bills M.D.   On: 04/11/2019 04:51   Dg Chest Port 1 View  Result Date: 04/10/2019 CLINICAL DATA:  COVID-19 positive, intubated EXAM: PORTABLE CHEST 1 VIEW COMPARISON:  04/08/2019 FINDINGS: Worsening bilateral patchy airspace process compatible with progressive pneumonia. Endotracheal tube 6.2 cm above the carina. NG tube in the proximal stomach. Monitor leads overlie the chest. No effusion or pneumothorax. IMPRESSION: Worsening bilateral pneumonia pattern. Support apparatus in good position. Electronically Signed   By: Judie Petit.  Shick M.D.   On: 04/10/2019 17:34    Pamella Pertostin Lejla Moeser, MD, PhD [facility]  Contact via  [email or web address]  [facility] Office Info P: [phone]  F: [phone]

## 2019-04-12 NOTE — Progress Notes (Signed)
OT Cancellation Note  Patient Details Name: Melvin Mills MRN: [id number] DOB: 09-May-1971   Cancelled Treatment:    Reason Eval/Treat Not Completed: Medical issues which prohibited therapy;Patient not medically ready. Pt attempted extubation this AM and required reintubation. Will return as schedule allows and pt medically stable.   Yarelli Decelles M Alainah Phang Stephene Alegria MSOT, OTR/L Acute Rehab Pager: [phone] Office: [phone] 04/12/2019, 1:06 PM

## 2019-04-13 LAB — GLUCOSE, CAPILLARY
Glucose-Capillary: 126 mg/dL — ABNORMAL HIGH (ref 70–99)
Glucose-Capillary: 169 mg/dL — ABNORMAL HIGH (ref 70–99)
Glucose-Capillary: 177 mg/dL — ABNORMAL HIGH (ref 70–99)
Glucose-Capillary: 191 mg/dL — ABNORMAL HIGH (ref 70–99)
Glucose-Capillary: 226 mg/dL — ABNORMAL HIGH (ref 70–99)
Glucose-Capillary: 229 mg/dL — ABNORMAL HIGH (ref 70–99)
Glucose-Capillary: 235 mg/dL — ABNORMAL HIGH (ref 70–99)

## 2019-04-13 LAB — BPAM FFP
Blood Product Expiration Date: 202006020333
ISSUE DATE / TIME: 202006010424
Unit Type and Rh: 5100

## 2019-04-13 LAB — MRSA PCR SCREENING: MRSA by PCR: NEGATIVE

## 2019-04-13 LAB — CBC WITH DIFFERENTIAL/PLATELET
Abs Immature Granulocytes: 0.1 10*3/uL — ABNORMAL HIGH (ref 0.00–0.07)
Basophils Absolute: 0.1 10*3/uL (ref 0.0–0.1)
Basophils Relative: 0 %
Eosinophils Absolute: 0 10*3/uL (ref 0.0–0.5)
Eosinophils Relative: 0 %
HCT: 43.3 % (ref 39.0–52.0)
Hemoglobin: 14 g/dL (ref 13.0–17.0)
Immature Granulocytes: 1 %
Lymphocytes Relative: 5 %
Lymphs Abs: 0.6 10*3/uL — ABNORMAL LOW (ref 0.7–4.0)
MCH: 27.1 pg (ref 26.0–34.0)
MCHC: 32.3 g/dL (ref 30.0–36.0)
MCV: 83.9 fL (ref 80.0–100.0)
Monocytes Absolute: 0.5 10*3/uL (ref 0.1–1.0)
Monocytes Relative: 4 %
Neutro Abs: 10.8 10*3/uL — ABNORMAL HIGH (ref 1.7–7.7)
Neutrophils Relative %: 90 %
Platelets: 183 10*3/uL (ref 150–400)
RBC: 5.16 MIL/uL (ref 4.22–5.81)
RDW: 14 % (ref 11.5–15.5)
WBC: 11.9 10*3/uL — ABNORMAL HIGH (ref 4.0–10.5)
nRBC: 0 % (ref 0.0–0.2)

## 2019-04-13 LAB — CULTURE, BLOOD (ROUTINE X 2)
Culture: NO GROWTH
Special Requests: ADEQUATE

## 2019-04-13 LAB — COMPREHENSIVE METABOLIC PANEL
ALT: 76 U/L — ABNORMAL HIGH (ref 0–44)
AST: 73 U/L — ABNORMAL HIGH (ref 15–41)
Albumin: 2.5 g/dL — ABNORMAL LOW (ref 3.5–5.0)
Alkaline Phosphatase: 89 U/L (ref 38–126)
Anion gap: 10 (ref 5–15)
BUN: 34 mg/dL — ABNORMAL HIGH (ref 6–20)
CO2: 30 mmol/L (ref 22–32)
Calcium: 8.5 mg/dL — ABNORMAL LOW (ref 8.9–10.3)
Chloride: 104 mmol/L (ref 98–111)
Creatinine, Ser: 1.07 mg/dL (ref 0.61–1.24)
GFR calc Af Amer: >60 mL/min (ref >60)
GFR calc non Af Amer: >60 mL/min (ref >60)
Glucose, Bld: 175 mg/dL — ABNORMAL HIGH (ref 70–99)
Potassium: 3.9 mmol/L (ref 3.5–5.1)
Sodium: 144 mmol/L (ref 135–145)
Total Bilirubin: 0.7 mg/dL (ref 0.3–1.2)
Total Protein: 6.7 g/dL (ref 6.5–8.1)

## 2019-04-13 LAB — CULTURE, RESPIRATORY W GRAM STAIN: Culture: NO GROWTH

## 2019-04-13 LAB — BASIC METABOLIC PANEL
Anion gap: 12 (ref 5–15)
BUN: 34 mg/dL — ABNORMAL HIGH (ref 6–20)
CO2: 30 mmol/L (ref 22–32)
Calcium: 8.2 mg/dL — ABNORMAL LOW (ref 8.9–10.3)
Chloride: 101 mmol/L (ref 98–111)
Creatinine, Ser: 0.99 mg/dL (ref 0.61–1.24)
GFR calc Af Amer: >60 mL/min (ref >60)
GFR calc non Af Amer: >60 mL/min (ref >60)
Glucose, Bld: 149 mg/dL — ABNORMAL HIGH (ref 70–99)
Potassium: 3.5 mmol/L (ref 3.5–5.1)
Sodium: 143 mmol/L (ref 135–145)

## 2019-04-13 LAB — D-DIMER, QUANTITATIVE: D-Dimer, Quant: 0.81 ug/mL-FEU — ABNORMAL HIGH (ref 0.00–0.50)

## 2019-04-13 LAB — PREPARE FRESH FROZEN PLASMA: Unit division: 0

## 2019-04-13 LAB — FERRITIN: Ferritin: 1441 ng/mL — ABNORMAL HIGH (ref 24–336)

## 2019-04-13 LAB — LACTATE DEHYDROGENASE: LDH: 519 U/L — ABNORMAL HIGH (ref 98–192)

## 2019-04-13 LAB — FIBRINOGEN: Fibrinogen: 747 mg/dL — ABNORMAL HIGH (ref 210–475)

## 2019-04-13 LAB — HEPARIN LEVEL (UNFRACTIONATED): Heparin Unfractionated: 0.85 IU/mL — ABNORMAL HIGH (ref 0.30–0.70)

## 2019-04-13 LAB — MAGNESIUM: Magnesium: 2.5 mg/dL — ABNORMAL HIGH (ref 1.7–2.4)

## 2019-04-13 LAB — C-REACTIVE PROTEIN: CRP: 6.9 mg/dL — ABNORMAL HIGH (ref <1.0)

## 2019-04-13 MED ORDER — CHLORHEXIDINE GLUCONATE CLOTH 2 % EX PADS
6.0000 | MEDICATED_PAD | Freq: Every day | CUTANEOUS | Status: DC
  Administered 2019-04-14 – 2019-04-30 (×16): 6 via TOPICAL

## 2019-04-13 MED ORDER — FUROSEMIDE 10 MG/ML IJ SOLN
40.0000 mg | Freq: Two times a day (BID) | INTRAMUSCULAR | Status: AC
  Administered 2019-04-13 (×2): 40 mg via INTRAVENOUS
  Filled 2019-04-13 (×2): qty 4

## 2019-04-13 MED ORDER — CHLORHEXIDINE GLUCONATE 0.12 % MT SOLN
15.0000 mL | Freq: Two times a day (BID) | OROMUCOSAL | Status: DC
  Administered 2019-04-14 – 2019-04-16 (×5): 15 mL via OROMUCOSAL
  Filled 2019-04-13 (×3): qty 15

## 2019-04-13 MED ORDER — HEPARIN (PORCINE) 25000 UT/250ML-% IV SOLN
900.0000 [IU]/h | INTRAVENOUS | Status: DC
  Administered 2019-04-13: 20:00:00 900 [IU]/h via INTRAVENOUS

## 2019-04-13 MED ORDER — METOPROLOL TARTRATE 25 MG PO TABS
12.5000 mg | ORAL_TABLET | Freq: Two times a day (BID) | ORAL | Status: DC
  Administered 2019-04-13 – 2019-04-16 (×7): 12.5 mg via ORAL
  Filled 2019-04-13 (×8): qty 1

## 2019-04-13 MED ORDER — HEPARIN (PORCINE) 25000 UT/250ML-% IV SOLN
1100.0000 [IU]/h | INTRAVENOUS | Status: DC
  Administered 2019-04-13: 12:00:00 1100 [IU]/h via INTRAVENOUS
  Filled 2019-04-13: qty 250

## 2019-04-13 MED ORDER — POTASSIUM CHLORIDE 20 MEQ/15ML (10%) PO SOLN
40.0000 meq | Freq: Once | ORAL | Status: AC
  Administered 2019-04-13: 01:00:00 40 meq
  Filled 2019-04-13: qty 30

## 2019-04-13 MED ORDER — VITAL AF 1.2 CAL PO LIQD
1000.0000 mL | ORAL | Status: DC
  Administered 2019-04-13 – 2019-04-16 (×4): 1000 mL

## 2019-04-13 NOTE — Progress Notes (Signed)
Nutrition Follow-up  DOCUMENTATION CODES:   Not applicable  INTERVENTION:   To better meet re-estimated needs, change Vital AF 1.2 goal rate to 65 ml/h.  Provides 1872 kcal, 117 gm protein, 1265 ml free water daily.  Continue MVI.  NUTRITION DIAGNOSIS:   Increased nutrient needs related to acute illness(COVID-19) as evidenced by estimated needs.  Ongoing   GOAL:   Patient will meet greater than or equal to 90% of their needs  Met with TF  MONITOR:   Vent status, TF tolerance, Labs, Weight trends  ASSESSMENT:   48 yo [place] male with no known PMH who was admitted with COVID-19 infection. He lives with several family members who are also sick.  Patient was extubated yesterday, but quickly required re-intubation.   OGT in place. Currently receiving Vital AF 1.2 at 70 ml/h. Just advanced to goal rate at 6AM today. Tolerating TF well.   Patient is currently intubated on ventilator support MV: 14.7 L/min Temp (24hrs), Avg:98.1 F (36.7 C), Min:97.7 F (36.5 C), Max:98.5 F (36.9 C)   Labs reviewed. BUN 34 (H) CBG's: [phone] Medications reviewed and include Colace, Lasix, Novolog, Solu-medrol, MVI.  Weight stable Net I/O +1 L   Diet Order:   Diet Order            Diet NPO time specified  Diet effective now              EDUCATION NEEDS:   No education needs have been identified at this time  Skin:  Skin Assessment: Reviewed RN Assessment  Last BM:  6/1  Height:   Ht Readings from Last 1 Encounters:  04/08/19 5' 3" (1.6 m)    Weight:   Wt Readings from Last 1 Encounters:  04/13/19 74.9 kg    Ideal Body Weight:  56.4 kg  BMI:  Body mass index is 29.25 kg/m.  Estimated Nutritional Needs:   Kcal:  1870  Protein:  110-125 gm  Fluid:  >/= 1.8 L    Kimberly Harris, RD, LDN, CNSC Pager [phone] After Hours Pager [phone]  

## 2019-04-13 NOTE — Progress Notes (Signed)
PROGRESS NOTE  Melvin Mills ZOX:[id number] DOB: 1970/12/23 DOA: 04/08/2019 PCP: Patient, No Pcp Per   LOS: 5 days   Brief Narrative / Interim history: 48 year old male with no medical history presents to the hospital and was admitted on 04/08/2019 with about a week worth of fevers, shortness of breath, productive cough, found to have tachypnea, chest x-ray infiltrate.  He was found to be positive for COVID-19.  Patient was initially started on antibiotics, was in regular floor however had progressive hypoxia requiring intubation on 5/30. He was extubated on 6/1 however developed respiratory distress, A fib with RVR requiring reintubation  Events Admit 5/28 ETT 5/30 Failed extubation 6/1 and reintubated 6/1 6/1 new onset A fib RVR  Subjective / Interval events: -developed bradycardia last night, Amiodarone infusion on hold  Assessment & Plan: Active Problems:   COVID-19  Principal Problem Acute Hypoxic Respiratory Failure due to Covid-19 Viral Illness /ARDS -Patient had rapidly progressive respiratory failure since admission requiring intubation on 04/10/2019.  He was moved to the ICU and critical care was consulted -Patient was started on ceftriaxone, azithromycin, discontinue today after 5 days -Given concern for sepsis he was not started on Actemra -Patient started on Remdesivir on 5/30, continue -Lasix today  Vent Mode: PRVC FiO2 (%):  [40 %-100 %] 80 % Set Rate:  [30 bmp] 30 bmp Vt Set:  [450 mL] 450 mL PEEP:  [5 cmH20-12 cmH20] 12 cmH20 Pressure Support:  [5 cmH20] 5 cmH20 Plateau Pressure:  [25 cmH20-27 cmH20] 26 cmH20   -Continue to remain on vent and weaning trials as tolerated   -Inflammatory markers improving COVID-19 Labs  Recent Labs    04/11/19 0540 04/12/19 0530 04/13/19 0530  DDIMER 0.90* 1.14* 0.81*  FERRITIN 1,750* 1,740* 1,441*  LDH 532* 515* 519*  CRP 25.3* 13.8* 6.9*    Lab Results  Component Value Date   SARSCOV2NAA POSITIVE (A) 04/08/2019    A. fib with RVR -New onset last night in the setting of respiratory distress -With episodes of bradycardia in the setting of amiodarone as well as dexmedetomidine infusions, amiodarone has now been on hold and has remained stable.  F rates overnight have been in the mid 60s, and he is in the mid 80s on the monitor in the room.  Will start low-dose metoprolol for rate control, start heparin for anticoagulation   Scheduled Meds: . sodium chloride   Intravenous Once  . chlorhexidine  15 mL Mouth/Throat BID  . Chlorhexidine Gluconate Cloth  6 each Topical Daily  . dextromethorphan  30 mg Per Tube BID   And  . guaiFENesin  15 mL Per Tube Q6H  . docusate  100 mg Per Tube BID  . enoxaparin (LOVENOX) injection  40 mg Subcutaneous BID  . famotidine  20 mg Oral BID  . insulin aspart  0-9 Units Subcutaneous Q4H  . insulin aspart  2 Units Subcutaneous Q4H  . mouth rinse  15 mL Mouth Rinse 10 times per day  . methylPREDNISolone (SOLU-MEDROL) injection  40 mg Intravenous Q12H  . multivitamin  15 mL Per Tube Daily  . sodium chloride flush  3 mL Intravenous Q12H   Continuous Infusions: . amiodarone    . dexmedetomidine (PRECEDEX) IV infusion 0.4 mcg/kg/hr (04/13/19 0400)  . feeding supplement (VITAL AF 1.2 CAL) 60 mL/hr at 04/13/19 0246  . fentaNYL infusion INTRAVENOUS 100 mcg/hr (04/13/19 0400)  . remdesivir 100 mg in NS 250 mL 100 mg (04/12/19 0837)   PRN Meds:.acetaminophen (TYLENOL) oral liquid 160 mg/5  mL, bisacodyl, fentaNYL, ondansetron **OR** ondansetron (ZOFRAN) IV, oxyCODONE, polyethylene glycol  DVT prophylaxis: Lovenox Code Status: Full code Family Communication: PCCM discussed with family Disposition Plan: Remain in ICU  Consultants:   Critical care  Procedures:   None   Antimicrobials:  Ceftriaxone, azithromycin 5/28 --6/2  Remdesivir 5/29 --  Objective: Vitals:   04/13/19 0200 04/13/19 0300 04/13/19 0329 04/13/19 0400  BP: 117/88 (!) 120/96  (!) 138/95  Pulse:  (!) 134 (!) 55  68  Resp: (!) 30 (!) 30  (!) 27  Temp:   98.1 F (36.7 C)   TempSrc:   Oral   SpO2: 100% 100%  100%  Weight:      Height:        Intake/Output Summary (Last 24 hours) at 04/13/2019 0506 Last data filed at 04/13/2019 0400 Gross per 24 hour  Intake 3397.27 ml  Output 4416 ml  Net -1018.73 ml   Filed Weights   04/08/19 1706 04/11/19 0600 04/12/19 0500  Weight: 75 kg 73.6 kg 75.6 kg    Examination:  Constitutional: Alert, following commands appears comfortable Eyes: No icterus seen ENMT: Moist mixed membranes, ETT in place Neck: normal, supple Respiratory: Diminished at the bases but overall clear, no wheezing, no crackles Cardiovascular: Regular rate and rhythm, no murmurs appreciated.  No peripheral edema Abdomen: Soft, NT, ND, positive bowel sounds Musculoskeletal: no clubbing / cyanosis.  Skin: No rash seen Neurologic: Appears alert, moves all 4 extremities independently   Data Reviewed: I have independently reviewed following labs and imaging studies   CBC: Recent Labs  Lab 04/08/19 0926 04/09/19 0500 04/10/19 0457 04/10/19 1729 04/11/19 0451 04/11/19 0540 04/12/19 0438 04/12/19 0530  WBC 7.3 3.8* 6.1  --   --  6.5  --  8.9  NEUTROABS  --  2.7 3.9  --   --  5.7  --  8.1*  HGB 15.1 13.8 14.4 12.9* 13.3 13.8 12.6* 13.0  HCT 46.6 42.7 46.2 38.0* 39.0 42.3 37.0* 41.1  MCV 81.9 84.1 84.5  --   --  83.8  --  84.2  PLT 87* 91* 119*  --   --  124*  --  149*   Basic Metabolic Panel: Recent Labs  Lab 04/09/19 0500 04/10/19 0457  04/11/19 0451 04/11/19 0540 04/11/19 1710 04/12/19 0438 04/12/19 0500 04/12/19 0530 04/12/19 2130  NA 139 138   < > 138 138  --  139  --  137 143  K 3.5 3.6   < > 3.9 3.9  --  3.7  --  4.7 3.5  CL 106 102  --   --  101  --   --   --  104 101  CO2 23 27  --   --  25  --   --   --  24 30  GLUCOSE 111* 116*  --   --  222*  --   --   --  208* 149*  BUN 12 14  --   --  30*  --   --   --  34* 34*  CREATININE 1.10 1.22  --    --  1.28*  --   --   --  0.96 0.99  CALCIUM 8.1* 8.4*  --   --  8.0*  --   --   --  7.9* 8.2*  MG  --   --   --   --  2.3 2.5*  --  2.7*  --  2.5*  PHOS  --   --   --   --  3.8 3.1  --  2.6  --   --    < > = values in this interval not displayed.   GFR: Estimated Creatinine Clearance: 81.3 mL/min (by C-G formula based on SCr of 0.99 mg/dL). Liver Function Tests: Recent Labs  Lab 04/08/19 0926 04/09/19 0500 04/10/19 0457 04/11/19 0540 04/12/19 0530  AST 69* 93* 171* 108* 67*  ALT 34 45* 98* 90* 69*  ALKPHOS 59 53 81 73 70  BILITOT 0.6 0.4 0.5 0.4 0.4  PROT 7.6 6.8 7.3 7.0 6.2*  ALBUMIN 3.3* 3.0* 2.9* 2.6* 2.4*   Recent Labs  Lab 04/08/19 0926  LIPASE 21   No results for input(s): AMMONIA in the last 168 hours. Coagulation Profile: No results for input(s): INR, PROTIME in the last 168 hours. Cardiac Enzymes: No results for input(s): CKTOTAL, CKMB, CKMBINDEX, TROPONINI in the last 168 hours. BNP (last 3 results) No results for input(s): PROBNP in the last 8760 hours. HbA1C: No results for input(s): HGBA1C in the last 72 hours. CBG: Recent Labs  Lab 04/12/19 1334 04/12/19 1606 04/12/19 2003 04/12/19 2346 04/13/19 0318  GLUCAP 259* 236* 173* 126* 177*   Lipid Profile: Recent Labs    04/11/19 0540 04/11/19 1710  TRIG 127 130   Thyroid Function Tests: No results for input(s): TSH, T4TOTAL, FREET4, T3FREE, THYROIDAB in the last 72 hours. Anemia Panel: Recent Labs    04/11/19 0540 04/12/19 0530  FERRITIN 1,750* 1,740*   Urine analysis: No results found for: COLORURINE, APPEARANCEUR, LABSPEC, PHURINE, GLUCOSEU, HGBUR, BILIRUBINUR, KETONESUR, PROTEINUR, UROBILINOGEN, NITRITE, LEUKOCYTESUR Sepsis Labs: Invalid input(s): PROCALCITONIN, LACTICIDVEN  Recent Results (from the past 240 hour(s))  SARS Coronavirus 2 (CEPHEID- Performed in [facility] hospital lab), Hosp Order     Status: Abnormal   Collection Time: 04/08/19 10:23 AM  Result Value Ref Range Status    SARS Coronavirus 2 POSITIVE (A) NEGATIVE Final    Comment: RESULT CALLED TO, READ BACK BY AND VERIFIED WITH: Duanne Guess RN 12:10 04/08/19 ([email or web address]) (NOTE) If result is NEGATIVE SARS-CoV-2 target nucleic acids are NOT DETECTED. The SARS-CoV-2 RNA is generally detectable in upper and lower  respiratory specimens during the acute phase of infection. The lowest  concentration of SARS-CoV-2 viral copies this assay can detect is 250  copies / mL. A negative result does not preclude SARS-CoV-2 infection  and should not be used as the sole basis for treatment or other  patient management decisions.  A negative result may occur with  improper specimen collection / handling, submission of specimen other  than nasopharyngeal swab, presence of viral mutation(s) within the  areas targeted by this assay, and inadequate number of viral copies  (<250 copies / mL). A negative result must be combined with clinical  observations, patient history, and epidemiological information. If result is POSITIVE SARS-CoV-2 target nucleic acids are DETECTED.  The SARS-CoV-2 RNA is generally detectable in upper and lower  respiratory specimens during the acute phase of infection.  Positive  results are indicative of active infection with SARS-CoV-2.  Clinical  correlation with patient history and other diagnostic information is  necessary to determine patient infection status.  Positive results do  not rule out bacterial infection or co-infection with other viruses. If result is PRESUMPTIVE POSTIVE SARS-CoV-2 nucleic acids MAY BE PRESENT.   A presumptive positive result was obtained on the submitted specimen  and confirmed on repeat testing.  While 2019 novel coronavirus  (SARS-CoV-2) nucleic acids may be present in the submitted sample  additional confirmatory  testing may be necessary for epidemiological  and / or clinical management purposes  to differentiate between  SARS-CoV-2 and other Sarbecovirus currently  known to infect humans.  If clinically indicated additional testing with an alternate test  methodology [phone])  is advised. The SARS-CoV-2 RNA is generally  detectable in upper and lower respiratory specimens during the acute  phase of infection. The expected result is Negative. Fact Sheet for Patients:  BoilerBrush.com.cy Fact Sheet for Healthcare Providers: https://pope.com/ This test is not yet approved or cleared by the [place] FDA and has been authorized for detection and/or diagnosis of SARS-CoV-2 by FDA under an Emergency Use Authorization (EUA).  This EUA will remain in effect (meaning this test can be used) for the duration of the COVID-19 declaration under Section 564(b)(1) of the Act, 21 U.S.C. section 360bbb-3(b)(1), unless the authorization is terminated or revoked sooner. Performed at [facility], [address], [place], [place] [address]   Blood culture (routine x 2)     Status: None (Preliminary result)   Collection Time: 04/08/19 11:15 AM  Result Value Ref Range Status   Specimen Description BLOOD RIGHT ANTECUBITAL  Final   Special Requests   Final    BOTTLES DRAWN AEROBIC AND ANAEROBIC Blood Culture adequate volume   Culture   Final    NO GROWTH 4 DAYS Performed at [facility], [address] [place], [place] [address]    Report Status PENDING  Incomplete  Expectorated sputum assessment w rflx to resp cult     Status: None   Collection Time: 04/10/19  3:07 PM  Result Value Ref Range Status   Specimen Description SPUTUM  Final   Special Requests NONE  Final   Sputum evaluation   Final    THIS SPECIMEN IS ACCEPTABLE FOR SPUTUM CULTURE Performed at [facility], [address] [place], [place] [address]    Report Status 04/11/2019 FINAL  Final  Culture, respiratory     Status: None (Preliminary result)   Collection Time: 04/10/19  3:07 PM  Result Value Ref Range Status    Specimen Description   Final    SPUTUM Performed at [facility], [address] [place], [place] [address]    Special Requests   Final    NONE Reflexed from [id number] Performed at [facility], [address] [place], [place] [address]    Gram Stain   Final    MODERATE WBC PRESENT, PREDOMINANTLY PMN NO ORGANISMS SEEN    Culture   Final    NO GROWTH < 24 HOURS Performed at [facility], [address] [place], [place] [address]    Report Status PENDING  Incomplete      Radiology Studies: Dg Chest Port 1 View  Result Date: 04/12/2019 CLINICAL DATA:  Check endotracheal tube intubation EXAM: PORTABLE CHEST 1 VIEW COMPARISON:  04/11/2019 FINDINGS: Cardiac shadow is stable. Bilateral infiltrates are noted similar to that seen on prior exam. Endotracheal tube is again seen 5.4 cm above the carina. No bony abnormality is noted. IMPRESSION: Endotracheal tube in satisfactory position. Bilateral multifocal pneumonia. Electronically Signed   By: Alcide Clever M.D.   On: 04/12/2019 13:21   Dg Abd Portable 1v  Result Date: 04/12/2019 CLINICAL DATA:  OG tube placement EXAM: PORTABLE ABDOMEN - 1 VIEW COMPARISON:  None. FINDINGS: The enteric tube projects over the gastric body. The visualized bowel gas pattern is nonspecific. IMPRESSION: Enteric tube projects over the  gastric body. Electronically Signed   By: Katherine Mantlehristopher  Green M.D.   On: 04/12/2019 23:21    Pamella Pertostin Gherghe, MD, PhD [facility]  Contact via  [email or web address]  [facility] Office Info P: [phone]  F: [phone]

## 2019-04-13 NOTE — Progress Notes (Signed)
Spoke with patient's daughter and updated her on patient's care and treatment

## 2019-04-13 NOTE — Progress Notes (Signed)
Spoke with patients daughter Mhit on facetime and answered all questions she has at this time.

## 2019-04-13 NOTE — Progress Notes (Signed)
eLink Physician-Brief Progress Note Patient Name: Melvin Mills DOB: [birth year] MRN: [id number]   Date of Service  04/13/2019  HPI/Events of Note  K+ = 3.5 and Creatinine = 0.99.  eICU Interventions  Will replace K+.      Intervention Category Major Interventions: Electrolyte abnormality - evaluation and management  Sommer,Steven Eugene 04/13/2019, 12:52 AM

## 2019-04-13 NOTE — Progress Notes (Signed)
PT Cancellation Note  Patient Details Name: Michel Arrick MRN: [id number] DOB: Nov 27, 1970   Cancelled Treatment:    Reason Eval/Treat Not Completed: Medical issues which prohibited therapy.  Per RN pt becomes very tachycardic with any bed level mobility (rolling and even oral/mouth care).  She recommends holding today.  PT will continue to follow acutely to assess readiness for therapy.  Thanks,  Rollene Rotunda. Tyrail [place]field, PT, DPT  Acute Rehabilitation [phone] pager [phone]) [phone] office     Lurena Joiner B Chris Cripps 04/13/2019, 1:21 PM

## 2019-04-13 NOTE — Progress Notes (Signed)
ANTICOAGULATION CONSULT NOTE - Initial Consult  Pharmacy Consult for Heparin Indication: atrial fibrillation  No Known Allergies  Patient Measurements: Height: 5\' 3"  (160 cm) Weight: 165 lb 2 oz (74.9 kg) IBW/kg (Calculated) : 56.9 Heparin Dosing Weight: 72.3 kg  Vital Signs: Temp: 97.7 F (36.5 C) (06/02 0747) Temp Source: Axillary (06/02 0747) BP: 116/82 (06/02 0754) Pulse Rate: 67 (06/02 0754)  Labs: Recent Labs    04/11/19 0540 04/12/19 0438 04/12/19 0530 04/12/19 2130 04/13/19 0530  HGB 13.8 12.6* 13.0  --  14.0  HCT 42.3 37.0* 41.1  --  43.3  PLT 124*  --  149*  --  183  CREATININE 1.28*  --  0.96 0.99  --     Estimated Creatinine Clearance: 80.9 mL/min (by C-G formula based on SCr of 0.99 mg/dL).   Medical History: Past Medical History:  Diagnosis Date  . COVID-19 virus infection 04/08/2019    Medications:  Scheduled:  . sodium chloride   Intravenous Once  . chlorhexidine  15 mL Mouth/Throat BID  . Chlorhexidine Gluconate Cloth  6 each Topical Daily  . dextromethorphan  30 mg Per Tube BID   And  . guaiFENesin  15 mL Per Tube Q6H  . docusate  100 mg Per Tube BID  . enoxaparin (LOVENOX) injection  40 mg Subcutaneous BID  . famotidine  20 mg Oral BID  . furosemide  40 mg Intravenous Q12H  . insulin aspart  0-9 Units Subcutaneous Q4H  . insulin aspart  2 Units Subcutaneous Q4H  . mouth rinse  15 mL Mouth Rinse 10 times per day  . methylPREDNISolone (SOLU-MEDROL) injection  40 mg Intravenous Q12H  . metoprolol tartrate  12.5 mg Oral BID  . multivitamin  15 mL Per Tube Daily  . sodium chloride flush  3 mL Intravenous Q12H   Infusions:  . dexmedetomidine (PRECEDEX) IV infusion 0.4 mcg/kg/hr (04/13/19 0700)  . feeding supplement (VITAL AF 1.2 CAL) 70 mL/hr at 04/13/19 0513  . fentaNYL infusion INTRAVENOUS 50 mcg/hr (04/13/19 0700)  . remdesivir 100 mg in NS 250 mL 100 mg (04/12/19 9024)    Assessment: 48 yoM admitted on 5/28 with COVID related  hypoxic respiratory failure.  Developed Afib on 6/1, started on Amio infusion.  Today, Pharmacy is consulted to start Heparin IV for Afib.  He was initially Enoxaparin 40 mg q24 (5/28-5/31), increased to q12h dosing for ICU admission.   SCr 0.99, CrCl ~ 80 ml/min Hgb 14, Plt 183 D-dimer 0.81  Goal of Therapy:  Heparin level 0.3-0.7 units/ml Monitor platelets by anticoagulation protocol: Yes   Plan:  D/C Enoxaparin No heparin bolus d/t recent Enoxaparin dose Start heparin IV infusion at 1100 units/hr Heparin level 6 hours after starting Daily heparin level and CBC, monitor for s/s bleeding.   Lynann Beaver PharmD, BCPS Clinical Pharmacist Clinical pharmacist phone 7am- 5pm: [phone] 04/13/2019 9:34 AM

## 2019-04-13 NOTE — Progress Notes (Signed)
ANTICOAGULATION CONSULT NOTE - Initial Consult  Pharmacy Consult for Heparin Indication: atrial fibrillation  No Known Allergies  Patient Measurements: Height: 5\' 3"  (160 cm) Weight: 165 lb 2 oz (74.9 kg) IBW/kg (Calculated) : 56.9 Heparin Dosing Weight: 72.3 kg  Vital Signs: Temp: 98.7 F (37.1 C) (06/02 1928) Temp Source: Oral (06/02 1928) BP: 162/88 (06/02 1900) Pulse Rate: 63 (06/02 1900)  Labs: Recent Labs    04/11/19 0540 04/12/19 0438 04/12/19 0530 04/12/19 2130 04/13/19 0530 04/13/19 1815  HGB 13.8 12.6* 13.0  --  14.0  --   HCT 42.3 37.0* 41.1  --  43.3  --   PLT 124*  --  149*  --  183  --   HEPARINUNFRC  --   --   --   --   --  0.85*  CREATININE 1.28*  --  0.96 0.99 1.07  --     Estimated Creatinine Clearance: 74.9 mL/min (by C-G formula based on SCr of 1.07 mg/dL).   Medical History: Past Medical History:  Diagnosis Date  . COVID-19 virus infection 04/08/2019    Medications:  Scheduled:  . sodium chloride   Intravenous Once  . chlorhexidine  15 mL Mouth/Throat BID  . Chlorhexidine Gluconate Cloth  6 each Topical Daily  . dextromethorphan  30 mg Per Tube BID   And  . guaiFENesin  15 mL Per Tube Q6H  . docusate  100 mg Per Tube BID  . famotidine  20 mg Oral BID  . furosemide  40 mg Intravenous Q12H  . insulin aspart  0-9 Units Subcutaneous Q4H  . insulin aspart  2 Units Subcutaneous Q4H  . mouth rinse  15 mL Mouth Rinse 10 times per day  . methylPREDNISolone (SOLU-MEDROL) injection  40 mg Intravenous Q12H  . metoprolol tartrate  12.5 mg Oral BID  . multivitamin  15 mL Per Tube Daily  . sodium chloride flush  3 mL Intravenous Q12H   Infusions:  . dexmedetomidine (PRECEDEX) IV infusion 0.3 mcg/kg/hr (04/13/19 1521)  . feeding supplement (VITAL AF 1.2 CAL) 1,000 mL (04/13/19 1941)  . fentaNYL infusion INTRAVENOUS 50 mcg/hr (04/13/19 1723)  . heparin 1,100 Units/hr (04/13/19 1158)  . remdesivir 100 mg in NS 250 mL 100 mg (04/13/19 1006)     Assessment: 48 yoM admitted on 5/28 with COVID related hypoxic respiratory failure.  Developed Afib on 6/1, started on Amio infusion.  Today, Pharmacy is consulted to start Heparin IV for Afib. He was initially Enoxaparin 40 mg q24 (5/28-5/31), increased to q12h dosing for ICU admission.    First heparin level 0.85, supratherapeutic on heparin 1100 units/hr No bleeding or IV site issues reported per discussion with RN  Goal of Therapy:  Heparin level 0.3-0.7 units/ml Monitor platelets by anticoagulation protocol: Yes   Plan:  Decrease heparin infusion to 900 units/hr Heparin level 6 hours after rate change Daily heparin level and CBC, monitor for s/s bleeding.   Loralee Pacas, PharmD, BCPS Pharmacy: [phone] 04/13/2019 7:44 PM

## 2019-04-13 NOTE — Progress Notes (Signed)
SLP Cancellation Note  Patient Details Name: Melvin Mills MRN: [id number] DOB: [birth year]   Cancelled evaluation: Pt remains intubated; will follow along for readiness.                                                                                                    Elzie Sheets L. Samson Frederic, MA CCC/SLP Acute Rehabilitation Services Office number [phone]   Blenda Mounts Laurice 04/13/2019, 12:21 PM

## 2019-04-13 NOTE — Progress Notes (Signed)
OT Cancellation Note  Patient Details Name: Melvin Mills MRN: [id number] DOB: 1971-02-12   Cancelled Treatment:    Reason Eval/Treat Not Completed: Medical issues which prohibited therapy;Patient not medically ready. Per RN pt becomes very tachycardic with any bed level mobility (rolling and even oral/mouth care).  She recommends holding today. Will return as schedule allows and pt medically ready. Thank you.  Fabiha Rougeau M Mallori Araque Leeya Rusconi MSOT, OTR/L Acute Rehab Pager: [phone] Office: [phone] 04/13/2019, 1:41 PM

## 2019-04-13 NOTE — Progress Notes (Signed)
[facility] PCCM  Updated patient's daughter by phone  Heber [place], MD [facility] PCCM Pager: [phone] Cell: [phone] If no response, call [phone]

## 2019-04-13 NOTE — Progress Notes (Signed)
NAME:  Melvin Mills, MRN:  [id number], DOB:  09-01-1971, LOS: 5 ADMISSION DATE:  04/08/2019, CONSULTATION DATE: Apr 10, 2019 REFERRING MD: Dr. Darlyn Read, CHIEF COMPLAINT: Dyspnea  Brief History   48 year old male with no past medical history was admitted on Apr 08, 2019 with COVID pneumonia causing acute respiratory failure with hypoxemia.  He required intubation and mechanical ventilation on Apr 10, 2019.  Past Medical History  none  Significant Hospital Events   May 28 admitted May 30 transferred to ICU and intubated 6/1 Extubated, developed atrial fibrillation with RVR, re-intubated  Consults:  Pulmonary and critical care medicine  Procedures:  Endotracheal tube May 30 >   Significant Diagnostic Tests:    Micro Data:  May 28 SARS-CoV-2 positive May 28 blood culture no growth to date  Antimicrobials/COVID treatment  May 28 ceftriaxone > 6/1 May 29 azithromycin > 6/1 May 30 remdesivir >  June 1 early a.m. administered convalescent plasma  Interim history/subjective:  Resting comfortably on ventilator this morning Making urine but requires in and out catheterization Fever overnight  Objective   Blood pressure 134/90, pulse 81, temperature 97.7 F (36.5 C), temperature source Axillary, resp. rate (!) 28, height 5\' 3"  (1.6 m), weight 74.9 kg, SpO2 97 %.    Vent Mode: PRVC FiO2 (%):  [50 %-100 %] 50 % Set Rate:  [30 bmp] 30 bmp Vt Set:  [450 mL] 450 mL PEEP:  [8 cmH20-12 cmH20] 8 cmH20 Plateau Pressure:  [22 cmH20-27 cmH20] 22 cmH20   Intake/Output Summary (Last 24 hours) at 04/13/2019 1204 Last data filed at 04/13/2019 1100 Gross per 24 hour  Intake 2905.04 ml  Output 5256 ml  Net -2350.96 ml   Filed Weights   04/11/19 0600 04/12/19 0500 04/13/19 0600  Weight: 73.6 kg 75.6 kg 74.9 kg    Examination:  General:  In bed on vent HENT: NCAT ETT in place PULM: CTA B, vent supported breathing CV: RRR, no mgr GI: BS+, soft, nontender MSK: normal bulk and tone  Neuro: awake, following commands    Resolved Hospital Problem list     Assessment & Plan:  COVID-19 pneumonia causing ARDS Continue full vent support RASS goal 0 to -1 VAP prevention Continue remdesivir Continue solumedrol Diuresis as tolerate  Atrial fibrillation with RVR Tele Amio> change to b-blocker via tube  Possible bacterial community-acquired pneumonia Monitor of antibiotics  Need for sedation for mechanical ventilation Continue sedation protocol, RASS goal 0 to -1   Best practice:  Diet: continue tube feeding Pain/Anxiety/Delirium protocol (if indicated): yes, target RASS score 0 to -1 VAP protocol (if indicated): Yes DVT prophylaxis lovenox GI prophylaxis: ppi Glucose control: per TRH Mobility: bed rest Code Status: full Family Communication: updated daughter on 6/1, will call again today Disposition: remain in ICU  Labs   CBC: Recent Labs  Lab 04/09/19 0500 04/10/19 0457  04/11/19 0451 04/11/19 0540 04/12/19 0438 04/12/19 0530 04/13/19 0530  WBC 3.8* 6.1  --   --  6.5  --  8.9 11.9*  NEUTROABS 2.7 3.9  --   --  5.7  --  8.1* 10.8*  HGB 13.8 14.4   < > 13.3 13.8 12.6* 13.0 14.0  HCT 42.7 46.2   < > 39.0 42.3 37.0* 41.1 43.3  MCV 84.1 84.5  --   --  83.8  --  84.2 83.9  PLT 91* 119*  --   --  124*  --  149* 183   < > = values in this interval not  displayed.    Basic Metabolic Panel: Recent Labs  Lab 04/10/19 0457  04/11/19 0540 04/11/19 1710 04/12/19 0438 04/12/19 0500 04/12/19 0530 04/12/19 2130 04/13/19 0530  NA 138   < > 138  --  139  --  137 143 144  K 3.6   < > 3.9  --  3.7  --  4.7 3.5 3.9  CL 102  --  101  --   --   --  104 101 104  CO2 27  --  25  --   --   --  24 30 30   GLUCOSE 116*  --  222*  --   --   --  208* 149* 175*  BUN 14  --  30*  --   --   --  34* 34* 34*  CREATININE 1.22  --  1.28*  --   --   --  0.96 0.99 1.07  CALCIUM 8.4*  --  8.0*  --   --   --  7.9* 8.2* 8.5*  MG  --   --  2.3 2.5*  --  2.7*  --  2.5*  --    PHOS  --   --  3.8 3.1  --  2.6  --   --   --    < > = values in this interval not displayed.   GFR: Estimated Creatinine Clearance: 74.9 mL/min (by C-G formula based on SCr of 1.07 mg/dL). Recent Labs  Lab 04/08/19 1235 04/08/19 2030  04/10/19 0457 04/10/19 1526 04/11/19 0540 04/12/19 0530 04/13/19 0530  PROCALCITON  --  1.90  --   --  1.30  --   --   --   WBC  --   --    < > 6.1  --  6.5 8.9 11.9*  LATICACIDVEN 1.1  --   --   --   --   --   --   --    < > = values in this interval not displayed.    Liver Function Tests: Recent Labs  Lab 04/09/19 0500 04/10/19 0457 04/11/19 0540 04/12/19 0530 04/13/19 0530  AST 93* 171* 108* 67* 73*  ALT 45* 98* 90* 69* 76*  ALKPHOS 53 81 73 70 89  BILITOT 0.4 0.5 0.4 0.4 0.7  PROT 6.8 7.3 7.0 6.2* 6.7  ALBUMIN 3.0* 2.9* 2.6* 2.4* 2.5*   Recent Labs  Lab 04/08/19 0926  LIPASE 21   No results for input(s): AMMONIA in the last 168 hours.  ABG    Component Value Date/Time   PHART 7.395 04/12/2019 0438   PCO2ART 44.3 04/12/2019 0438   PO2ART 52.0 (L) 04/12/2019 0438   HCO3 27.3 04/12/2019 0438   TCO2 29 04/12/2019 0438   O2SAT 87.0 04/12/2019 0438     Coagulation Profile: No results for input(s): INR, PROTIME in the last 168 hours.  Cardiac Enzymes: No results for input(s): CKTOTAL, CKMB, CKMBINDEX, TROPONINI in the last 168 hours.  HbA1C: No results found for: HGBA1C  CBG: Recent Labs  Lab 04/12/19 2003 04/12/19 2346 04/13/19 0318 04/13/19 0756 04/13/19 1134  GLUCAP 173* 126* 177* 169* 235*     Critical care time: 3831 mintues    Heber CarolinaBrent Treasa Bradshaw, MD [facility] Pager: [phone] Cell: [phone] If no response, call [phone]

## 2019-04-14 DIAGNOSIS — I4819 Other persistent atrial fibrillation: Secondary | ICD-10-CM

## 2019-04-14 DIAGNOSIS — E876 Hypokalemia: Secondary | ICD-10-CM

## 2019-04-14 DIAGNOSIS — J9601 Acute respiratory failure with hypoxia: Secondary | ICD-10-CM

## 2019-04-14 LAB — COMPREHENSIVE METABOLIC PANEL
ALT: 90 U/L — ABNORMAL HIGH (ref 0–44)
AST: 55 U/L — ABNORMAL HIGH (ref 15–41)
Albumin: 2.7 g/dL — ABNORMAL LOW (ref 3.5–5.0)
Alkaline Phosphatase: 92 U/L (ref 38–126)
Anion gap: 9 (ref 5–15)
BUN: 40 mg/dL — ABNORMAL HIGH (ref 6–20)
CO2: 31 mmol/L (ref 22–32)
Calcium: 8.1 mg/dL — ABNORMAL LOW (ref 8.9–10.3)
Chloride: 101 mmol/L (ref 98–111)
Creatinine, Ser: 1.03 mg/dL (ref 0.61–1.24)
GFR calc Af Amer: >60 mL/min (ref >60)
GFR calc non Af Amer: >60 mL/min (ref >60)
Glucose, Bld: 223 mg/dL — ABNORMAL HIGH (ref 70–99)
Potassium: 3.2 mmol/L — ABNORMAL LOW (ref 3.5–5.1)
Sodium: 141 mmol/L (ref 135–145)
Total Bilirubin: 0.5 mg/dL (ref 0.3–1.2)
Total Protein: 6.7 g/dL (ref 6.5–8.1)

## 2019-04-14 LAB — C-REACTIVE PROTEIN: CRP: 4.4 mg/dL — ABNORMAL HIGH (ref <1.0)

## 2019-04-14 LAB — CBC
HCT: 45 % (ref 39.0–52.0)
Hemoglobin: 14.5 g/dL (ref 13.0–17.0)
MCH: 26.8 pg (ref 26.0–34.0)
MCHC: 32.2 g/dL (ref 30.0–36.0)
MCV: 83 fL (ref 80.0–100.0)
Platelets: 204 10*3/uL (ref 150–400)
RBC: 5.42 MIL/uL (ref 4.22–5.81)
RDW: 13.9 % (ref 11.5–15.5)
WBC: 10 10*3/uL (ref 4.0–10.5)
nRBC: 0 % (ref 0.0–0.2)

## 2019-04-14 LAB — GLUCOSE, CAPILLARY
Glucose-Capillary: 219 mg/dL — ABNORMAL HIGH (ref 70–99)
Glucose-Capillary: 187 mg/dL — ABNORMAL HIGH (ref 70–99)
Glucose-Capillary: 198 mg/dL — ABNORMAL HIGH (ref 70–99)
Glucose-Capillary: 207 mg/dL — ABNORMAL HIGH (ref 70–99)
Glucose-Capillary: 220 mg/dL — ABNORMAL HIGH (ref 70–99)

## 2019-04-14 LAB — FIBRINOGEN: Fibrinogen: >800 mg/dL — ABNORMAL HIGH (ref 210–475)

## 2019-04-14 LAB — HEPARIN LEVEL (UNFRACTIONATED)
Heparin Unfractionated: 0.6 IU/mL (ref 0.30–0.70)
Heparin Unfractionated: 0.39 IU/mL (ref 0.30–0.70)

## 2019-04-14 LAB — LACTATE DEHYDROGENASE: LDH: 421 U/L — ABNORMAL HIGH (ref 98–192)

## 2019-04-14 LAB — D-DIMER, QUANTITATIVE: D-Dimer, Quant: 0.89 ug/mL-FEU — ABNORMAL HIGH (ref 0.00–0.50)

## 2019-04-14 LAB — FERRITIN: Ferritin: 950 ng/mL — ABNORMAL HIGH (ref 24–336)

## 2019-04-14 MED ORDER — ENOXAPARIN SODIUM 80 MG/0.8ML ~~LOC~~ SOLN
75.0000 mg | Freq: Once | SUBCUTANEOUS | Status: AC
  Administered 2019-04-14: 75 mg via SUBCUTANEOUS
  Filled 2019-04-14: qty 0.75

## 2019-04-14 MED ORDER — METHYLPREDNISOLONE SODIUM SUCC 40 MG IJ SOLR
40.0000 mg | Freq: Every day | INTRAMUSCULAR | Status: AC
  Administered 2019-04-15 – 2019-04-16 (×2): 40 mg via INTRAVENOUS
  Filled 2019-04-14 (×2): qty 1

## 2019-04-14 MED ORDER — ENOXAPARIN SODIUM 80 MG/0.8ML ~~LOC~~ SOLN
75.0000 mg | Freq: Two times a day (BID) | SUBCUTANEOUS | Status: DC
  Administered 2019-04-14 – 2019-04-15 (×2): 75 mg via SUBCUTANEOUS
  Filled 2019-04-14: qty 0.75
  Filled 2019-04-14: qty 0.8
  Filled 2019-04-14: qty 0.75
  Filled 2019-04-14: qty 0.8

## 2019-04-14 MED ORDER — INSULIN GLARGINE 100 UNIT/ML ~~LOC~~ SOLN
8.0000 [IU] | Freq: Every day | SUBCUTANEOUS | Status: DC
  Administered 2019-04-14: 22:00:00 8 [IU] via SUBCUTANEOUS
  Filled 2019-04-14 (×2): qty 0.08

## 2019-04-14 MED ORDER — POTASSIUM CHLORIDE 20 MEQ/15ML (10%) PO SOLN
40.0000 meq | Freq: Two times a day (BID) | ORAL | Status: DC
  Administered 2019-04-14: 09:00:00 40 meq via ORAL
  Filled 2019-04-14: qty 30

## 2019-04-14 MED ORDER — POTASSIUM CHLORIDE 20 MEQ/15ML (10%) PO SOLN
40.0000 meq | Freq: Three times a day (TID) | ORAL | Status: DC
  Administered 2019-04-14 (×2): 40 meq via ORAL
  Filled 2019-04-14 (×2): qty 30

## 2019-04-14 MED ORDER — FUROSEMIDE 10 MG/ML IJ SOLN
40.0000 mg | Freq: Two times a day (BID) | INTRAMUSCULAR | Status: AC
  Administered 2019-04-14 (×2): 40 mg via INTRAVENOUS
  Filled 2019-04-14 (×2): qty 4

## 2019-04-14 MED ORDER — POTASSIUM CHLORIDE 20 MEQ PO PACK
40.0000 meq | PACK | Freq: Two times a day (BID) | ORAL | Status: DC
  Filled 2019-04-14: qty 2

## 2019-04-14 NOTE — Progress Notes (Signed)
OT Cancellation Note  Patient Details Name: Melvin Mills MRN: [id number] DOB: February 27, 1971   Cancelled Treatment:    Reason Eval/Treat Not Completed: Patient not medically ready. Pt remains intubated. Per nsg asked to hold today. Will reassess readiness tomorrow.   Thornell Mule, OT/L   Acute OT Clinical Specialist Acute Rehabilitation Services Pager [phone] Office [phone]  04/14/2019, 8:57 AM

## 2019-04-14 NOTE — Progress Notes (Signed)
PT Cancellation Note  Patient Details Name: Melvin Mills MRN: [id number] DOB: February 11, 1971   Cancelled Treatment:    Reason Eval/Treat Not Completed: Medical issues which prohibited therapy.  Per RN, pt requiring significant sedation to stay comfortable (when lifted he gets restless and uncomfortable).  She recommends PT/OT hold for today.  Vent settings have been trending better, so PT will follow along for more medical stability.  Thanks,  Rollene Rotunda. Ariann Khaimov, PT, DPT  Acute Rehabilitation [phone] pager [phone]) [phone] office     Lurena Joiner B Nash Bolls 04/14/2019, 8:57 AM

## 2019-04-14 NOTE — Progress Notes (Signed)
NAME:  Melvin Mills, MRN:  [id number], DOB:  02/08/1971, LOS: 6 ADMISSION DATE:  04/08/2019, CONSULTATION DATE: Apr 10, 2019 REFERRING MD: Dr. Darlyn ReadGrunzs, CHIEF COMPLAINT: Dyspnea  Brief History   48 year old male with no past medical history was admitted on Apr 08, 2019 with COVID pneumonia causing acute respiratory failure with hypoxemia.  He required intubation and mechanical ventilation on Apr 10, 2019.  Past Medical History  none  Significant Hospital Events   5/28 admitted 5/30 transferred to ICU and intubated 6/1 Extubated, developed atrial fibrillation with RVR, re-intubated  Consults:  Pulmonary and critical care medicine  Procedures:  5/30 > Intubated 5/31 > Extubated and reintubated  Significant Diagnostic Tests:    Micro Data:  May 28 SARS-CoV-2 positive May 28 blood culture no growth to date  Antimicrobials/COVID treatment  May 28 ceftriaxone > 6/1 May 29 azithromycin > 6/1 May 30 remdesivir >  June 1 early a.m. administered convalescent plasma  Interim history/subjective:  Awake, following commands  Objective   Blood pressure (!) 144/91, pulse 64, temperature 98 F (36.7 C), temperature source Axillary, resp. rate (!) 23, height 5\' 3"  (1.6 m), weight 74.9 kg, SpO2 93 %.    Vent Mode: PSV;CPAP FiO2 (%):  [40 %-60 %] 50 % Set Rate:  [30 bmp] 30 bmp Vt Set:  [450 mL] 450 mL PEEP:  [5 cmH20] 5 cmH20 Pressure Support:  [8 cmH20-12 cmH20] 12 cmH20 Plateau Pressure:  [17 cmH20-20 cmH20] 17 cmH20   Intake/Output Summary (Last 24 hours) at 04/14/2019 1327 Last data filed at 04/14/2019 1225 Gross per 24 hour  Intake 2244.17 ml  Output 2385 ml  Net -140.83 ml   Filed Weights   04/11/19 0600 04/12/19 0500 04/13/19 0600  Weight: 73.6 kg 75.6 kg 74.9 kg   Physical Exam: General: Critically ill-appearing, no acute distress HENT: [place], AT, ETT in place Eyes: EOMI, no scleral icterus Respiratory: Clear to auscultation bilaterally.  No crackles, wheezing or rales  Cardiovascular: RRR, -M/R/G, no JVD GI: BS+, soft, nontender Extremities:-Edema,-tenderness Neuro: Awake, follows commands, moves extremities x 4  Resolved Hospital Problem list     Assessment & Plan:  ARDS secondary to COVID-19: Not a candidate for Tocilizumab due to sepsis on admission Continue Remdesivir Day 5 Wean PEEP and FIO2 per ARDSnet protocol SBT later today or tomorrow Diurese Titrate methylpred off Sedation for RASS goal 0 to -1 VAP prevention PT/OT eval  Hypokalemia Replete  Atrial fibrillation with RVR: NSR with amio Tele Continue BB Change heparin to lovenox  Best practice:  Diet: continue tube feeding Pain/Anxiety/Delirium protocol (if indicated): yes, target RASS score 0 to -1 VAP protocol (if indicated): Yes DVT prophylaxis lovenox GI prophylaxis: ppi Glucose control: per TRH Mobility: PT/OT eval Code Status: full Family Communication: Attempted to call daughter on 6/3. No VM set up. Will try again later today. Disposition: remain in ICU  Labs   CBC: Recent Labs  Lab 04/09/19 0500 04/10/19 0457  04/11/19 0540 04/12/19 0438 04/12/19 0530 04/13/19 0530 04/14/19 0220  WBC 3.8* 6.1  --  6.5  --  8.9 11.9* 10.0  NEUTROABS 2.7 3.9  --  5.7  --  8.1* 10.8*  --   HGB 13.8 14.4   < > 13.8 12.6* 13.0 14.0 14.5  HCT 42.7 46.2   < > 42.3 37.0* 41.1 43.3 45.0  MCV 84.1 84.5  --  83.8  --  84.2 83.9 83.0  PLT 91* 119*  --  124*  --  149* 183 204   < > =  values in this interval not displayed.    Basic Metabolic Panel: Recent Labs  Lab 04/11/19 0540 04/11/19 1710 04/12/19 0438 04/12/19 0500 04/12/19 0530 04/12/19 2130 04/13/19 0530 04/14/19 0220  NA 138  --  139  --  137 143 144 141  K 3.9  --  3.7  --  4.7 3.5 3.9 3.2*  CL 101  --   --   --  104 101 104 101  CO2 25  --   --   --  24 30 30 31   GLUCOSE 222*  --   --   --  208* 149* 175* 223*  BUN 30*  --   --   --  34* 34* 34* 40*  CREATININE 1.28*  --   --   --  0.96 0.99 1.07 1.03   CALCIUM 8.0*  --   --   --  7.9* 8.2* 8.5* 8.1*  MG 2.3 2.5*  --  2.7*  --  2.5*  --   --   PHOS 3.8 3.1  --  2.6  --   --   --   --    GFR: Estimated Creatinine Clearance: 77.8 mL/min (by C-G formula based on SCr of 1.03 mg/dL). Recent Labs  Lab 04/08/19 1235 04/08/19 2030  04/10/19 1526 04/11/19 0540 04/12/19 0530 04/13/19 0530 04/14/19 0220  PROCALCITON  --  1.90  --  1.30  --   --   --   --   WBC  --   --    < >  --  6.5 8.9 11.9* 10.0  LATICACIDVEN 1.1  --   --   --   --   --   --   --    < > = values in this interval not displayed.    Liver Function Tests: Recent Labs  Lab 04/10/19 0457 04/11/19 0540 04/12/19 0530 04/13/19 0530 04/14/19 0220  AST 171* 108* 67* 73* 55*  ALT 98* 90* 69* 76* 90*  ALKPHOS 81 73 70 89 92  BILITOT 0.5 0.4 0.4 0.7 0.5  PROT 7.3 7.0 6.2* 6.7 6.7  ALBUMIN 2.9* 2.6* 2.4* 2.5* 2.7*   Recent Labs  Lab 04/08/19 0926  LIPASE 21   No results for input(s): AMMONIA in the last 168 hours.  ABG    Component Value Date/Time   PHART 7.395 04/12/2019 0438   PCO2ART 44.3 04/12/2019 0438   PO2ART 52.0 (L) 04/12/2019 0438   HCO3 27.3 04/12/2019 0438   TCO2 29 04/12/2019 0438   O2SAT 87.0 04/12/2019 0438     Coagulation Profile: No results for input(s): INR, PROTIME in the last 168 hours.  Cardiac Enzymes: No results for input(s): CKTOTAL, CKMB, CKMBINDEX, TROPONINI in the last 168 hours.  HbA1C: No results found for: HGBA1C  CBG: Recent Labs  Lab 04/13/19 1935 04/13/19 2335 04/14/19 0337 04/14/19 0753 04/14/19 1159  GLUCAP 191* 226* 219* 187* 198*     Critical care time: 44 mintues    The patient is critically ill with multiple organ systems failure and requires high complexity decision making for assessment and support, frequent evaluation and titration of therapies, application of advanced monitoring technologies and extensive interpretation of multiple databases.   Discussed and co-managed patient care with  PCCM-Hospitalist. Coordinated care with RT, RN and pharmacist.  Mechele Collin, M.D. [facility] Pulmonary/Critical Care Medicine Pager: [phone] After hours pager: [phone]

## 2019-04-14 NOTE — Progress Notes (Signed)
ANTICOAGULATION CONSULT NOTE - Follow Up Consult  Pharmacy Consult for Heparin Indication: atrial fibrillation  No Known Allergies  Patient Measurements: Height: 5\' 3"  (160 cm) Weight: 165 lb 2 oz (74.9 kg) IBW/kg (Calculated) : 56.9 Heparin Dosing Weight: 72.3 kg  Vital Signs: Temp: 97.8 F (36.6 C) (06/03 0330) Temp Source: Oral (06/03 0330) BP: 124/84 (06/03 0600) Pulse Rate: 52 (06/03 0600)  Labs: Recent Labs    04/12/19 0530 04/12/19 2130 04/13/19 0530 04/13/19 1815 04/14/19 0220  HGB 13.0  --  14.0  --  14.5  HCT 41.1  --  43.3  --  45.0  PLT 149*  --  183  --  204  HEPARINUNFRC  --   --   --  0.85* 0.60  CREATININE 0.96 0.99 1.07  --  1.03    Estimated Creatinine Clearance: 77.8 mL/min (by C-G formula based on SCr of 1.03 mg/dL).   Medications:  Infusions:  . dexmedetomidine (PRECEDEX) IV infusion 0.2 mcg/kg/hr (04/14/19 0600)  . feeding supplement (VITAL AF 1.2 CAL) 1,000 mL (04/13/19 1941)  . fentaNYL infusion INTRAVENOUS 75 mcg/hr (04/14/19 0600)  . heparin 900 Units/hr (04/14/19 0600)  . remdesivir 100 mg in NS 250 mL 100 mg (04/13/19 1006)    Assessment: 48 yoM admitted on 5/28 with COVID related hypoxic respiratory failure.  Developed Afib on 6/1, Pharmacy is consulted to start Heparin IV for Afib on 6/2.  He was initially Enoxaparin 40 mg q24 (5/28-5/31), increased to q12h dosing for ICU admission.    SCr 1, CrCl ~ 78 ml/min Hgb 14.5, Plt 204 D-dimer 0.81, 0.89 Heparin level 0.39 No bleeding or complications reported.  Goal of Therapy:  Heparin level 0.3-0.7 units/ml Monitor platelets by anticoagulation protocol: Yes   Plan:  Continue heparin IV infusion at 900 units/hr Daily heparin level and CBC, monitor for s/s bleeding.   Lynann Beaver PharmD, BCPS Clinical Pharmacist Clinical pharmacist phone 7am- 5pm: [phone] 04/14/2019 7:44 AM

## 2019-04-14 NOTE — Progress Notes (Signed)
Spoke with patient's daughter, Hmit at 73 and gave her updates on patient. She was able to face time with patient. She did not have questions regarding patient's condition and was grateful for the care that we were giving patient.  She will call later to check in on patient.

## 2019-04-14 NOTE — Progress Notes (Signed)
ANTICOAGULATION CONSULT NOTE - Follow Up Consult  Pharmacy Consult for Enoxaparin Indication: atrial fibrillation  No Known Allergies  Patient Measurements: Height: 5\' 3"  (160 cm) Weight: 165 lb 2 oz (74.9 kg) IBW/kg (Calculated) : 56.9 Heparin Dosing Weight: 72.3 kg  Vital Signs: Temp: 97.8 F (36.6 C) (06/03 0330) Temp Source: Oral (06/03 0330) BP: 120/83 (06/03 0805) Pulse Rate: 57 (06/03 0805)  Labs: Recent Labs    04/12/19 0530 04/12/19 2130 04/13/19 0530 04/13/19 1815 04/14/19 0220 04/14/19 0851  HGB 13.0  --  14.0  --  14.5  --   HCT 41.1  --  43.3  --  45.0  --   PLT 149*  --  183  --  204  --   HEPARINUNFRC  --   --   --  0.85* 0.60 0.39  CREATININE 0.96 0.99 1.07  --  1.03  --     Estimated Creatinine Clearance: 77.8 mL/min (by C-G formula based on SCr of 1.03 mg/dL).   Medications:  Infusions:  . dexmedetomidine (PRECEDEX) IV infusion 0.2 mcg/kg/hr (04/14/19 0600)  . feeding supplement (VITAL AF 1.2 CAL) 1,000 mL (04/13/19 1941)  . fentaNYL infusion INTRAVENOUS 100 mcg/hr (04/14/19 0900)    Assessment: 48 yoM admitted on 5/28 with COVID related hypoxic respiratory failure.    Developed Afib on 6/1, Pharmacy is consulted to start Heparin IV for Afib on 6/2, then changed to Lovenox on 6/3.  SCr 1, CrCl ~ 78 ml/min Hgb 14.5, Plt 204 Heparin level 0.39 No bleeding or complications reported.  Goal of Therapy:  Heparin level 0.3-0.7 units/ml Monitor platelets by anticoagulation protocol: Yes   Plan:  D/C heparin infusion. Start Lovenox 1 mg/kg (75 mg) SQ q12h. Follow up SCr, CBC, monitor for s/s bleeding.   Lynann Beaver PharmD, BCPS Clinical Pharmacist Clinical pharmacist phone 7am- 5pm: [phone] 04/14/2019 12:15 PM

## 2019-04-14 NOTE — Progress Notes (Signed)
ANTICOAGULATION CONSULT NOTE - Follow-Up Consult  Pharmacy Consult for Heparin Indication: atrial fibrillation  No Known Allergies  Patient Measurements: Height: 5\' 3"  (160 cm) Weight: 165 lb 2 oz (74.9 kg) IBW/kg (Calculated) : 56.9 Heparin Dosing Weight: 72.3 kg  Vital Signs: Temp: 97.8 F (36.6 C) (06/03 0330) Temp Source: Oral (06/03 0330) BP: 124/86 (06/03 0500) Pulse Rate: 62 (06/03 0500)  Labs: Recent Labs    04/12/19 0530 04/12/19 2130 04/13/19 0530 04/13/19 1815 04/14/19 0220  HGB 13.0  --  14.0  --  14.5  HCT 41.1  --  43.3  --  45.0  PLT 149*  --  183  --  204  HEPARINUNFRC  --   --   --  0.85* 0.60  CREATININE 0.96 0.99 1.07  --  1.03    Estimated Creatinine Clearance: 77.8 mL/min (by C-G formula based on SCr of 1.03 mg/dL).   Medical History: Past Medical History:  Diagnosis Date  . COVID-19 virus infection 04/08/2019    Medications:  Scheduled:  . sodium chloride   Intravenous Once  . chlorhexidine  15 mL Mouth/Throat BID  . Chlorhexidine Gluconate Cloth  6 each Topical Daily  . dextromethorphan  30 mg Per Tube BID   And  . guaiFENesin  15 mL Per Tube Q6H  . docusate  100 mg Per Tube BID  . famotidine  20 mg Oral BID  . insulin aspart  0-9 Units Subcutaneous Q4H  . insulin aspart  2 Units Subcutaneous Q4H  . mouth rinse  15 mL Mouth Rinse 10 times per day  . methylPREDNISolone (SOLU-MEDROL) injection  40 mg Intravenous Q12H  . metoprolol tartrate  12.5 mg Oral BID  . multivitamin  15 mL Per Tube Daily  . sodium chloride flush  3 mL Intravenous Q12H   Infusions:  . dexmedetomidine (PRECEDEX) IV infusion 0.2 mcg/kg/hr (04/14/19 0500)  . feeding supplement (VITAL AF 1.2 CAL) 1,000 mL (04/13/19 1941)  . fentaNYL infusion INTRAVENOUS 75 mcg/hr (04/14/19 0500)  . heparin 900 Units/hr (04/14/19 0500)  . remdesivir 100 mg in NS 250 mL 100 mg (04/13/19 1006)    Assessment: 48 yoM admitted on 5/28 with COVID related hypoxic respiratory  failure.  Developed Afib on 6/1, started on Amio infusion.  Today, Pharmacy is consulted to start Heparin IV for Afib. He was initially Enoxaparin 40 mg q24 (5/28-5/31), increased to q12h dosing for ICU admission.     HL this AM is 0.60, therapeutic on heparin 900 units/hr  Hgb and plt WNL, stable   No bleeding or IV site issues reported per discussion with RN  Goal of Therapy:  Heparin level 0.3-0.7 units/ml Monitor platelets by anticoagulation protocol: Yes   Plan:   Continue heparin infusion at 900 units/hr  Heparin level 6 hours after rate change  Daily heparin level and CBC, monitor for s/s bleeding.    Adalberto Cole, PharmD, BCPS 04/14/2019 5:22 AM

## 2019-04-14 NOTE — Progress Notes (Signed)
PROGRESS NOTE    Melvin Mills  UJW:[id number] DOB: 01-21-1971 DOA: 04/08/2019 PCP: Melvin Mills      Brief Narrative:  Melvin Mills is a 48 y.o. M with no significant PMHx who presented with 1 week fever, SOB, productive cough.    In the ER, CXR showed bilateral infiltrates.  SARS-CoV-2 NAA positive.  Admitted to floor.  Progressively hypoxic 5/30 requiring intubation.  Extubated 6/1, developed respiratory distress and new onset Afib and was reintubated.       Assessment & Plan:  Coronavirus pneumonitis with acute hypoxic respiratory failure In setting of ongoing 2020 COVID-19 pandemic.  S/p convalescent plasma 6/1.   S/p Azithro and ceftriaxone x5d. S/p remdesivir 5d 5/30 to today.  Inflammatory markers improving.  -Wean Solu-medrol to 40 mg daily, taper off -Stop remdesivir at 5d -VTE PPx with Lovenox , ICU dosing -Repeat Lasix today -Close monitoring I/Os, daily Cr, K  -Daily d-dimer, ferritin and CRP  COVID-19 Labs Recent Labs    04/12/19 0530 04/13/19 0530 04/14/19 0220  DDIMER 1.14* 0.81* 0.89*  FERRITIN 1,740* 1,441* 950*  LDH 515* 519* 421*  CRP 13.8* 6.9* 4.4*      New onset atrial fibrillation No previous history.  CHA2DS2-Vasc would be 0-1 depending on A1c.  If CHADS 1, favor anticoagulation while acutely ill, given arterial thrombosis risk with COVID.  Back to sinus rhythm for last 24 hours -Stop heparin --> transition to Lovenox for now, follow A1c -Hold amiodarone -Continue metoprolol -Obtain Echo when able -Chgeck TSH  Hypokalemia Mag normal.   -Supplement K  Hyperglycemia Glucoses still >180.  Requiring 25-30 units insulin Mills day last 3 days. -Taper steroids -Start low dose Lantus -Check A1c      MDM and disposition: The below labs and imaging reports were reviewed and summarized above.  Medication management as above.  The patient was admitted with acute hypoxic respiratory failure form COVID-19.  Also  hypergylcemia and also new onset Afib.    The patient is critically ill with multi-organ failure.  Critical care was necessary to treat or prevent imminent or life-threatening deterioration of respiratory failure, cardiac failure, and was exclusive of separately billable procedures and treating other patients. Total critical care time spent by me: 55 minutes Time spent personally by me on obtaining history from patient or surrogate, evaluation of the patient, evaluation of patient's response to treatment, ordering and review of laboratory studies, ordering and review of radiographic studies, ordering and performing treatments and interventions, and re-evaluation of the patient's condition.      Ventilator best practice:  Diet: TFs VAP protocol (if indicated): Yes GI prophylaxis: Famotidine Glucose control: Insulin Mobility: PT  DVT prophylaxis: Lovenox therapeutic dose Code Status: FULL     Consultants:   PCCM  Procedures:   5/30 ETT  6/1 extubation  6/1 ETT x2 (reintubation)  Antimicrobials:   Azithromycin 5/29>> 6/1  Ceftriaxone 5/28>> 6/1  Remdesivir 5/30 >> 6/3  Culture data:   5/28 blood culture x1 -- NG  5/30 respiratory culture --NG  MRSA nares negative      Subjective: No further Afib.  Afebrile.  HRs low ovenright.  Weaned today.   Objective: Vitals:   04/14/19 0805 04/14/19 0810 04/14/19 1200 04/14/19 1244  BP: 120/83   (!) 144/91  Pulse: (!) 57   64  Resp: (!) 30   (!) 23  Temp:   98 F (36.7 C)   TempSrc:   Axillary   SpO2: 96% 97%  93%  Weight:      Height:        Intake/Output Summary (Last 24 hours) at 04/14/2019 1541 Last data filed at 04/14/2019 1225 [place] 24 hour  Intake 2039.07 ml  Output 2060 ml  Net -20.93 ml   Filed Weights   04/11/19 0600 04/12/19 0500 04/13/19 0600  Weight: 73.6 kg 75.6 kg 74.9 kg    Examination:  General appearance: thin adult male, intubated, off sedation   HEENT: Anicteric, conjunctiva  normal, Pupils normal, lids and lashes normal. No nasal deformity, discharge, epistaxis.    Skin: Warm and dry.  no jaundice.  No suspicious rashes or lesions. Cardiac: RRR, nl S1-S2, no murmurs appreciated.  Capillary refill is brisk.  JVP normal.  No LE edema.  Radia pulses 2+ and symmetric. Respiratory: Intubated, on spontaneous breathing trial, lung sounds coarse bilaterally Abdomen: Abdomen soft.  no TTP. No ascites, distension, hepatosplenomegaly.   Neuro/Psych: makes eye contact, follows commands, pupils equal      Data Reviewed: I have personally reviewed following labs and imaging studies:  CBC: Recent Labs  Lab 04/09/19 0500 04/10/19 0457  04/11/19 0540 04/12/19 0438 04/12/19 0530 04/13/19 0530 04/14/19 0220  WBC 3.8* 6.1  --  6.5  --  8.9 11.9* 10.0  NEUTROABS 2.7 3.9  --  5.7  --  8.1* 10.8*  --   HGB 13.8 14.4   < > 13.8 12.6* 13.0 14.0 14.5  HCT 42.7 46.2   < > 42.3 37.0* 41.1 43.3 45.0  MCV 84.1 84.5  --  83.8  --  84.2 83.9 83.0  PLT 91* 119*  --  124*  --  149* 183 204   < > = values in this interval not displayed.   Basic Metabolic Panel: Recent Labs  Lab 04/11/19 0540 04/11/19 1710 04/12/19 0438 04/12/19 0500 04/12/19 0530 04/12/19 2130 04/13/19 0530 04/14/19 0220  NA 138  --  139  --  137 143 144 141  K 3.9  --  3.7  --  4.7 3.5 3.9 3.2*  CL 101  --   --   --  104 101 104 101  CO2 25  --   --   --  24 30 30 31   GLUCOSE 222*  --   --   --  208* 149* 175* 223*  BUN 30*  --   --   --  34* 34* 34* 40*  CREATININE 1.28*  --   --   --  0.96 0.99 1.07 1.03  CALCIUM 8.0*  --   --   --  7.9* 8.2* 8.5* 8.1*  MG 2.3 2.5*  --  2.7*  --  2.5*  --   --   PHOS 3.8 3.1  --  2.6  --   --   --   --    GFR: Estimated Creatinine Clearance: 77.8 mL/min (by C-G formula based on SCr of 1.03 mg/dL). Liver Function Tests: Recent Labs  Lab 04/10/19 0457 04/11/19 0540 04/12/19 0530 04/13/19 0530 04/14/19 0220  AST 171* 108* 67* 73* 55*  ALT 98* 90* 69* 76* 90*   ALKPHOS 81 73 70 89 92  BILITOT 0.5 0.4 0.4 0.7 0.5  PROT 7.3 7.0 6.2* 6.7 6.7  ALBUMIN 2.9* 2.6* 2.4* 2.5* 2.7*   Recent Labs  Lab 04/08/19 0926  LIPASE 21   No results for input(s): AMMONIA in the last 168 hours. Coagulation Profile: No results for input(s): INR, PROTIME in the last 168 hours.  Cardiac Enzymes: No results for input(s): CKTOTAL, CKMB, CKMBINDEX, TROPONINI in the last 168 hours. BNP (last 3 results) No results for input(s): PROBNP in the last 8760 hours. HbA1C: No results for input(s): HGBA1C in the last 72 hours. CBG: Recent Labs  Lab 04/13/19 1935 04/13/19 2335 04/14/19 0337 04/14/19 0753 04/14/19 1159  GLUCAP 191* 226* 219* 187* 198*   Lipid Profile: Recent Labs    04/11/19 1710  TRIG 130   Thyroid Function Tests: No results for input(s): TSH, T4TOTAL, FREET4, T3FREE, THYROIDAB in the last 72 hours. Anemia Panel: Recent Labs    04/13/19 0530 04/14/19 0220  FERRITIN 1,441* 950*   Urine analysis: No results found for: COLORURINE, APPEARANCEUR, LABSPEC, PHURINE, GLUCOSEU, HGBUR, BILIRUBINUR, KETONESUR, PROTEINUR, UROBILINOGEN, NITRITE, LEUKOCYTESUR Sepsis Labs: (procalcitonin:4,lacticacidven:4)  ) Recent Results (from the past 240 hour(s))  SARS Coronavirus 2 (CEPHEID- Performed in [facility] hospital lab), Hosp Order     Status: Abnormal   Collection Time: 04/08/19 10:23 AM  Result Value Ref Range Status   SARS Coronavirus 2 POSITIVE (A) NEGATIVE Final    Comment: RESULT CALLED TO, READ BACK BY AND VERIFIED WITH: Duanne Guess RN 12:10 04/08/19 ([email or web address]) (NOTE) If result is NEGATIVE SARS-CoV-2 target nucleic acids are NOT DETECTED. The SARS-CoV-2 RNA is generally detectable in upper and lower  respiratory specimens during the acute phase of infection. The lowest  concentration of SARS-CoV-2 viral copies this assay can detect is 250  copies / mL. A negative result does not preclude SARS-CoV-2 infection  and should not be used as  the sole basis for treatment or other  patient management decisions.  A negative result may occur with  improper specimen collection / handling, submission of specimen other  than nasopharyngeal swab, presence of viral mutation(s) within the  areas targeted by this assay, and inadequate number of viral copies  (<250 copies / mL). A negative result must be combined with clinical  observations, patient history, and epidemiological information. If result is POSITIVE SARS-CoV-2 target nucleic acids are DETECTED.  The SARS-CoV-2 RNA is generally detectable in upper and lower  respiratory specimens during the acute phase of infection.  Positive  results are indicative of active infection with SARS-CoV-2.  Clinical  correlation with patient history and other diagnostic information is  necessary to determine patient infection status.  Positive results do  not rule out bacterial infection or co-infection with other viruses. If result is PRESUMPTIVE POSTIVE SARS-CoV-2 nucleic acids MAY BE PRESENT.   A presumptive positive result was obtained on the submitted specimen  and confirmed on repeat testing.  While 2019 novel coronavirus  (SARS-CoV-2) nucleic acids may be present in the submitted sample  additional confirmatory testing may be necessary for epidemiological  and / or clinical management purposes  to differentiate between  SARS-CoV-2 and other Sarbecovirus currently known to infect humans.  If clinically indicated additional testing with an alternate test  methodology [id number])  is advised. The SARS-CoV-2 RNA is generally  detectable in upper and lower respiratory specimens during the acute  phase of infection. The expected result is Negative. Fact Sheet for Patients:  BoilerBrush.com.cy Fact Sheet for Healthcare Providers: https://pope.com/ This test is not yet approved or cleared by the [place] FDA and has been authorized for  detection and/or diagnosis of SARS-CoV-2 by FDA under an Emergency Use Authorization (EUA).  This EUA will remain in effect (meaning this test can be used) for the duration of the COVID-19 declaration under Section 564(b)(1) of the Act, 21 U.S.C.  section 360bbb-3(b)(1), unless the authorization is terminated or revoked sooner. Performed at [facility], [address], [place], [place] [address]   Blood culture (routine x 2)     Status: None   Collection Time: 04/08/19 11:15 AM  Result Value Ref Range Status   Specimen Description BLOOD RIGHT ANTECUBITAL  Final   Special Requests   Final    BOTTLES DRAWN AEROBIC AND ANAEROBIC Blood Culture adequate volume   Culture   Final    NO GROWTH 5 DAYS Performed at [facility], [address], [place], [place] [address]    Report Status 04/13/2019 FINAL  Final  Expectorated sputum assessment w rflx to resp cult     Status: None   Collection Time: 04/10/19  3:07 PM  Result Value Ref Range Status   Specimen Description SPUTUM  Final   Special Requests NONE  Final   Sputum evaluation   Final    THIS SPECIMEN IS ACCEPTABLE FOR SPUTUM CULTURE Performed at [facility], [address], [place], [place] [address]    Report Status 04/11/2019 FINAL  Final  Culture, respiratory     Status: None   Collection Time: 04/10/19  3:07 PM  Result Value Ref Range Status   Specimen Description   Final    SPUTUM Performed at [facility], [address] [place], [place] [address]    Special Requests   Final    NONE Reflexed from [phone] Performed at [facility], [address] [place], [place] [address]    Gram Stain   Final    MODERATE WBC PRESENT, PREDOMINANTLY PMN NO ORGANISMS SEEN    Culture   Final    NO GROWTH 2 DAYS Performed at [facility], [address] [place], [place] [address]    Report Status 04/13/2019 FINAL  Final  MRSA PCR Screening     Status: None    Collection Time: 04/13/19  1:26 PM  Result Value Ref Range Status   MRSA by PCR NEGATIVE NEGATIVE Final    Comment:        The GeneXpert MRSA Assay (FDA approved for NASAL specimens only), is one component of a comprehensive MRSA colonization surveillance program. It is not intended to diagnose MRSA infection nor to guide or monitor treatment for MRSA infections. Performed at [facility], [address], [place], [place] [address]          Radiology Studies: Dg Abd Portable 1v  Result Date: 04/12/2019 CLINICAL DATA:  OG tube placement EXAM: PORTABLE ABDOMEN - 1 VIEW COMPARISON:  None. FINDINGS: The enteric tube projects over the gastric body. The visualized bowel gas pattern is nonspecific. IMPRESSION: Enteric tube projects over the gastric body. Electronically Signed   By: Katherine Mantle M.D.   On: 04/12/2019 23:21        Scheduled Meds: . sodium chloride   Intravenous Once  . chlorhexidine  15 mL Mouth/Throat BID  . Chlorhexidine Gluconate Cloth  6 each Topical Daily  . dextromethorphan  30 mg Mills Tube BID   And  . guaiFENesin  15 mL Mills Tube Q6H  . docusate  100 mg Mills Tube BID  . enoxaparin (LOVENOX) injection  75 mg Subcutaneous Q12H  . famotidine  20 mg Oral BID  . furosemide  40 mg Intravenous BID  . insulin aspart  0-9 Units Subcutaneous Q4H  . insulin aspart  2 Units Subcutaneous Q4H  .  mouth rinse  15 mL Mouth Rinse 10 times Mills day  . [START ON 04/15/2019] methylPREDNISolone (SOLU-MEDROL) injection  40 mg Intravenous Daily  . metoprolol tartrate  12.5 mg Oral BID  . multivitamin  15 mL Mills Tube Daily  . potassium chloride  40 mEq Oral TID  . sodium chloride flush  3 mL Intravenous Q12H   Continuous Infusions: . dexmedetomidine (PRECEDEX) IV infusion 0.2 mcg/kg/hr (04/14/19 0600)  . feeding supplement (VITAL AF 1.2 CAL) 1,000 mL (04/14/19 1518)  . fentaNYL infusion INTRAVENOUS 75 mcg/hr (04/14/19 1519)     LOS: 6 days    Time  spent:   During this encounter: Patient Isolation: Airborne, contact, droplet HCP PPE: Face shield, head covering, N95, gown, gloves, and shoe covers    Alberteen Sam, MD [facility] 04/14/2019, 3:41 PM     Please page through AMION:  [email or web address] Password [email or web address] If 7PM-7AM, please contact night-coverage

## 2019-04-15 DIAGNOSIS — I4891 Unspecified atrial fibrillation: Secondary | ICD-10-CM

## 2019-04-15 DIAGNOSIS — R739 Hyperglycemia, unspecified: Secondary | ICD-10-CM

## 2019-04-15 DIAGNOSIS — R740 Nonspecific elevation of levels of transaminase and lactic acid dehydrogenase [LDH]: Secondary | ICD-10-CM

## 2019-04-15 DIAGNOSIS — J9621 Acute and chronic respiratory failure with hypoxia: Secondary | ICD-10-CM

## 2019-04-15 LAB — CBC
HCT: 47.8 % (ref 39.0–52.0)
Hemoglobin: 15.4 g/dL (ref 13.0–17.0)
MCH: 27.1 pg (ref 26.0–34.0)
MCHC: 32.2 g/dL (ref 30.0–36.0)
MCV: 84.2 fL (ref 80.0–100.0)
Platelets: 206 10*3/uL (ref 150–400)
RBC: 5.68 MIL/uL (ref 4.22–5.81)
RDW: 14.2 % (ref 11.5–15.5)
WBC: 10 10*3/uL (ref 4.0–10.5)
nRBC: 0.2 % (ref 0.0–0.2)

## 2019-04-15 LAB — GLUCOSE, CAPILLARY
Glucose-Capillary: 178 mg/dL — ABNORMAL HIGH (ref 70–99)
Glucose-Capillary: 192 mg/dL — ABNORMAL HIGH (ref 70–99)
Glucose-Capillary: 213 mg/dL — ABNORMAL HIGH (ref 70–99)
Glucose-Capillary: 265 mg/dL — ABNORMAL HIGH (ref 70–99)
Glucose-Capillary: 275 mg/dL — ABNORMAL HIGH (ref 70–99)
Glucose-Capillary: 285 mg/dL — ABNORMAL HIGH (ref 70–99)
Glucose-Capillary: 321 mg/dL — ABNORMAL HIGH (ref 70–99)

## 2019-04-15 LAB — COMPREHENSIVE METABOLIC PANEL
ALT: 85 U/L — ABNORMAL HIGH (ref 0–44)
AST: 49 U/L — ABNORMAL HIGH (ref 15–41)
Albumin: 2.8 g/dL — ABNORMAL LOW (ref 3.5–5.0)
Alkaline Phosphatase: 98 U/L (ref 38–126)
Anion gap: 12 (ref 5–15)
BUN: 45 mg/dL — ABNORMAL HIGH (ref 6–20)
CO2: 30 mmol/L (ref 22–32)
Calcium: 8.7 mg/dL — ABNORMAL LOW (ref 8.9–10.3)
Chloride: 103 mmol/L (ref 98–111)
Creatinine, Ser: 1.08 mg/dL (ref 0.61–1.24)
GFR calc Af Amer: >60 mL/min (ref >60)
GFR calc non Af Amer: >60 mL/min (ref >60)
Glucose, Bld: 192 mg/dL — ABNORMAL HIGH (ref 70–99)
Potassium: 3.5 mmol/L (ref 3.5–5.1)
Sodium: 145 mmol/L (ref 135–145)
Total Bilirubin: 0.7 mg/dL (ref 0.3–1.2)
Total Protein: 7.2 g/dL (ref 6.5–8.1)

## 2019-04-15 LAB — FERRITIN: Ferritin: 794 ng/mL — ABNORMAL HIGH (ref 24–336)

## 2019-04-15 LAB — T4, FREE: Free T4: 1.71 ng/dL (ref 0.82–1.77)

## 2019-04-15 LAB — TSH: TSH: 0.253 u[IU]/mL — ABNORMAL LOW (ref 0.350–4.500)

## 2019-04-15 LAB — HEMOGLOBIN A1C
Hgb A1c MFr Bld: 6.2 % — ABNORMAL HIGH (ref 4.8–5.6)
Mean Plasma Glucose: 131.24 mg/dL

## 2019-04-15 LAB — D-DIMER, QUANTITATIVE: D-Dimer, Quant: 1.1 ug/mL-FEU — ABNORMAL HIGH (ref 0.00–0.50)

## 2019-04-15 LAB — C-REACTIVE PROTEIN: CRP: 5.3 mg/dL — ABNORMAL HIGH (ref <1.0)

## 2019-04-15 MED ORDER — ENOXAPARIN SODIUM 40 MG/0.4ML ~~LOC~~ SOLN
40.0000 mg | Freq: Two times a day (BID) | SUBCUTANEOUS | Status: DC
  Administered 2019-04-15 – 2019-04-30 (×30): 40 mg via SUBCUTANEOUS
  Filled 2019-04-15 (×30): qty 0.4

## 2019-04-15 MED ORDER — ASPIRIN 325 MG PO TABS
325.0000 mg | ORAL_TABLET | Freq: Every day | ORAL | Status: DC
  Administered 2019-04-16 – 2019-04-30 (×14): 325 mg via ORAL
  Filled 2019-04-15 (×17): qty 1

## 2019-04-15 MED ORDER — IPRATROPIUM-ALBUTEROL 0.5-2.5 (3) MG/3ML IN SOLN
RESPIRATORY_TRACT | Status: AC
  Administered 2019-04-15: 09:00:00
  Filled 2019-04-15: qty 3

## 2019-04-15 MED ORDER — INSULIN GLARGINE 100 UNIT/ML ~~LOC~~ SOLN
12.0000 [IU] | Freq: Every day | SUBCUTANEOUS | Status: DC
  Administered 2019-04-15: 22:00:00 12 [IU] via SUBCUTANEOUS
  Filled 2019-04-15: qty 0.12

## 2019-04-15 MED ORDER — ASPIRIN 81 MG PO CHEW
324.0000 mg | CHEWABLE_TABLET | Freq: Once | ORAL | Status: AC
  Administered 2019-04-15: 22:00:00 324 mg
  Filled 2019-04-15: qty 4

## 2019-04-15 MED ORDER — POTASSIUM CHLORIDE 20 MEQ/15ML (10%) PO SOLN
40.0000 meq | Freq: Two times a day (BID) | ORAL | Status: DC
  Administered 2019-04-15: 10:00:00 40 meq via ORAL
  Filled 2019-04-15 (×2): qty 30

## 2019-04-15 MED ORDER — DOCUSATE SODIUM 50 MG/5ML PO LIQD: 100.0000 mg | Freq: Two times a day (BID) | ORAL | Status: DC | PRN

## 2019-04-15 MED ORDER — POTASSIUM CHLORIDE 20 MEQ/15ML (10%) PO SOLN
40.0000 meq | Freq: Two times a day (BID) | ORAL | Status: AC
  Administered 2019-04-15 – 2019-04-16 (×2): 40 meq via ORAL
  Filled 2019-04-15 (×2): qty 30

## 2019-04-15 MED ORDER — POTASSIUM CHLORIDE 20 MEQ/15ML (10%) PO SOLN: 20.0000 meq | ORAL | Status: DC

## 2019-04-15 MED ORDER — FUROSEMIDE 10 MG/ML IJ SOLN
40.0000 mg | Freq: Two times a day (BID) | INTRAMUSCULAR | Status: AC
  Administered 2019-04-15 (×2): 40 mg via INTRAVENOUS
  Filled 2019-04-15 (×2): qty 4

## 2019-04-15 MED ORDER — ASPIRIN 325 MG PO TABS
325.0000 mg | ORAL_TABLET | Freq: Every day | ORAL | Status: DC
  Filled 2019-04-15 (×2): qty 1

## 2019-04-15 MED ORDER — IPRATROPIUM-ALBUTEROL 0.5-2.5 (3) MG/3ML IN SOLN
3.0000 mL | Freq: Four times a day (QID) | RESPIRATORY_TRACT | Status: DC
  Administered 2019-04-15 – 2019-04-16 (×4): 3 mL via RESPIRATORY_TRACT
  Filled 2019-04-15 (×4): qty 3

## 2019-04-15 NOTE — Progress Notes (Signed)
Occupational Therapy Evaluation Patient Details Name: Melvin Mills MRN: [id number] DOB: Mar 20, 1971 Today's Date: 04/15/2019    History of Present Illness 48 y.o. male admitted on 04/08/19 with fever.  Pt dx with COVID-19.  Pt became progressively hypoxic requiring intubation 04/10/19 attempted extubation 04/12/19, but developed new onset a-fib and respiratory distress and had to be re-intubated on the same day.  Pt with no other listed PMH on file.   Pt also with hypokalemia and hyperglycemia.     Clinical Impression   PTA, pt independent with ADL and mobility. Pt currently intubated on 50% FiO2; Peep 5. Pt able to follow all commands with good participation. Sat EOB for 15 min with occasional min A due to fatigue during his bath. Pt able to assist with his bath and grooming. Stood with +2 mod A and side stepped toward HOB. VSS throughout session. Pt giving "thumbs up" at end of session. Will complete home set information once pt extubated. Pt will likely made good progress and DC home with HHOT. Will follow acutely to facilitate DC home.     Follow Up Recommendations  Home health OT;Supervision/Assistance - 24 hour    Equipment Recommendations  3 in 1 bedside commode    Recommendations for Other Services PT consult     Precautions / Restrictions Precautions Precautions: Fall;Other (comment)(ET tube) Precaution Comments: monitor vitals      Mobility Bed Mobility Overal bed mobility: Needs Assistance Bed Mobility: Rolling;Sidelying to Sit;Sit to Sidelying Rolling: Mod assist Sidelying to sit: Mod assist;+2 for physical assistance     Sit to sidelying: Mod assist;+2 for physical assistance    Transfers Overall transfer level: Needs assistance   Transfers: Sit to/from Stand Sit to Stand: Mod assist;+2 physical assistance         General transfer comment: Able to side step 2 steps toward [facility]    Balance Overall balance assessment: Needs assistance Sitting-balance support:  Feet supported Sitting balance-Leahy Scale: Fair Sitting balance - Comments: able to sit unsupported; increased support with fatigue     Standing balance-Leahy Scale: Poor Standing balance comment: reliant on external support                           ADL either performed or assessed with clinical judgement   ADL Overall ADL's : Needs assistance/impaired Eating/Feeding: NPO   Grooming: Moderate assistance;Sitting   Upper Body Bathing: Moderate assistance;Sitting   Lower Body Bathing: Maximal assistance;Sit to/from stand   Upper Body Dressing : Moderate assistance;Sitting   Lower Body Dressing: Maximal assistance;Sit to/from stand               Functional mobility during ADLs: Moderate assistance General ADL Comments: Assisted with bath     Vision Baseline Vision/History: Wears glasses Wears Glasses: Reading only Vision Assessment?: Yes     Perception     Praxis      Pertinent Vitals/Pain Pain Assessment: Faces Faces Pain Scale: Hurts a little bit Pain Location: discomfort with moving with ET tube Pain Descriptors / Indicators: Discomfort Pain Intervention(s): Limited activity within patient's tolerance     Hand Dominance Right   Extremity/Trunk Assessment Upper Extremity Assessment Upper Extremity Assessment: Generalized weakness   Lower Extremity Assessment Lower Extremity Assessment: Defer to PT evaluation   Cervical / Trunk Assessment Cervical / Trunk Assessment: Normal   Communication Communication Communication: Prefers language other than English(but able to understand for eval)   Cognition Arousal/Alertness: Awake/alert Behavior During Therapy: [facility] for  tasks assessed/performed Overall Cognitive Status: Difficult to assess                                 General Comments: appears [facility]   General Comments       Exercises Exercises: General Upper Extremity General Exercises - Upper Extremity Shoulder Flexion:  AAROM;AROM;Both;10 reps;Seated Elbow Flexion: AROM;AAROM;Both;10 reps;Seated Elbow Extension: AROM;AAROM;Both;10 reps;Seated Digit Composite Flexion: AROM;Both;10 reps   Shoulder Instructions      Home Living Family/patient expects to be discharged to:: Private residence Living Arrangements: Children Available Help at Discharge: Family                             Additional Comments: Other people in home are sick with Covid; will get home set up once extubated      Prior Functioning/Environment Level of Independence: Independent                 OT Problem List: Decreased strength;Decreased activity tolerance;Impaired balance (sitting and/or standing);Decreased knowledge of use of DME or AE;Cardiopulmonary status limiting activity      OT Treatment/Interventions: Self-care/ADL training;Therapeutic exercise;Neuromuscular education;Energy conservation;Therapeutic activities;Patient/family education;Balance training    OT Goals(Current goals can be found in the care plan section) Acute Rehab OT Goals Patient Stated Goal: nodd "yes" to getting stronger  OT Goal Formulation: With patient Time For Goal Achievement: 04/29/19 Potential to Achieve Goals: Good  OT Frequency:     Barriers to D/C:            Co-evaluation PT/OT/SLP Co-Evaluation/Treatment: Yes Reason for Co-Treatment: Complexity of the patient's impairments (multi-system involvement);For patient/therapist safety;To address functional/ADL transfers   OT goals addressed during session: ADL's and self-care;Strengthening/ROM      AM-PAC OT "6 Clicks" Daily Activity     Outcome Measure Help from another person eating meals?: Total Help from another person taking care of personal grooming?: A Lot Help from another person toileting, which includes using toliet, bedpan, or urinal?: Total Help from another person bathing (including washing, rinsing, drying)?: A Lot Help from another person to put on and  taking off regular upper body clothing?: A Lot Help from another person to put on and taking off regular lower body clothing?: A Lot 6 Click Score: 10   End of Session Equipment Utilized During Treatment: Other (comment)(vent) Nurse Communication: Mobility status  Activity Tolerance: Patient tolerated treatment well Patient left: in bed;Other (comment);with call bell/phone within reach(chair position)  OT Visit Diagnosis: Unsteadiness on feet (R26.81);Muscle weakness (generalized) (M62.81);Other abnormalities of gait and mobility (R26.89)                Time: 1450-1530 OT Time Calculation (min): 40 min Charges:  OT General Charges $OT Visit: 1 Visit OT Evaluation $OT Eval High Complexity: 1 High  Onofrio Klemp, OT/L   Acute OT Clinical Specialist Acute Rehabilitation Services Pager [phone] Office [phone]   [facility],HILLARY 04/15/2019, 4:15 PM

## 2019-04-15 NOTE — Progress Notes (Signed)
[facility] ADULT ICU REPLACEMENT PROTOCOL FOR AM LAB REPLACEMENT ONLY  The patient does apply for the [facility] Adult ICU Electrolyte Replacment Protocol based on the criteria listed below:   1. Is GFR >/= 40 ml/min? Yes.    Patient's GFR today is >60 2. Is urine output >/= 0.5 ml/kg/hr for the last 6 hours? Yes.   Patient's UOP is 0.7 ml/kg/hr 3. Is BUN < 60 mg/dL? Yes.    Patient's BUN today is 45 4. Abnormal electrolyte(s): K-3.5 5. Ordered repletion with: per protocol 6. If a panic level lab has been reported, has the CCM MD in charge been notified? Yes.  .   Physician:  Dr Janne Lab, Dixon Boos 04/15/2019 6:15 AM

## 2019-04-15 NOTE — Progress Notes (Signed)
NAME:  Melvin Mills, MRN:  [id number], DOB:  [birth year], LOS: 7 ADMISSION DATE:  04/08/2019, CONSULTATION DATE: Apr 10, 2019 REFERRING MD: Dr. Darlyn Read, CHIEF COMPLAINT: Dyspnea  Brief History   48 year old male with no past medical history was admitted on Apr 08, 2019 with COVID pneumonia causing acute respiratory failure with hypoxemia.  He required intubation and mechanical ventilation on Apr 10, 2019.  Past Medical History  none  Significant Hospital Events   5/28 admitted 5/30 transferred to ICU and intubated 6/1 Extubated, developed atrial fibrillation with RVR, re-intubated  Consults:  Pulmonary and critical care medicine  Procedures:  5/30 > Intubated 5/31 > Extubated and reintubated  Significant Diagnostic Tests:    Micro Data:  May 28 SARS-CoV-2 positive May 28 blood culture no growth to date  Antimicrobials/COVID treatment  May 28 ceftriaxone > 6/1 May 29 azithromycin > 6/1 May 30 remdesivir > 6/3 June 1 convalescent plasma  Interim history/subjective:  Awake, following commands and answer questions with head nodding.   Objective   Blood pressure 110/84, pulse 94, temperature 98.4 F (36.9 C), temperature source Axillary, resp. rate (!) 30, height  (1.6 m), weight 74.9 kg, SpO2 95 %.    Vent Mode: PRVC FiO2 (%):  [50 %-60 %] 60 % Set Rate:  [30 bmp] 30 bmp Vt Set:  [450 mL] 450 mL PEEP:  [5 cmH20] 5 cmH20 Pressure Support:  [8 cmH20-12 cmH20] 12 cmH20 Plateau Pressure:  [17 cmH20-19 cmH20] 19 cmH20   Intake/Output Summary (Last 24 hours) at 04/15/2019 0729 Last data filed at 04/15/2019 0600 Gross per 24 hour  Intake 1287.18 ml  Output 3325 ml  Net -2037.82 ml   Filed Weights   04/11/19 0600 04/12/19 0500 04/13/19 0600  Weight: 73.6 kg 75.6 kg 74.9 kg   Physical Exam: General: Critically ill-appearing, NAD,  HENT: [place], AT, ETT in place Eyes: eom intact, no icterus or injection Respiratory: CTAB, no w/r/r  Cardiovascular: RRR, -M/R/G, no JVD,  warm extremities GI: soft, nontender, non-distended Extremities: no pitting edema, moving extremities Neuro: alert, follows commands, nods head to answer questions  Resolved Hospital Problem list   A. Fib with RVR  Assessment & Plan:  ARDS secondary to COVID-19: Not a candidate for Tocilizumab due to sepsis on admission Completed remdesivir 6/3 Wean PEEP and FIO2 per ARDSnet protocol SBT again today - possible extubation tomorrow  Cont lasix 40 mg bid Titrate methylpred off Sedation for RASS goal 0 to -1 VAP prevention Cont. PT/OT  Hypokalemia Cont. repleting with lasix  Atrial fibrillation with RVR: NSR with amio Tele Continue BB cont. lovenox at VTE dose and add full dose aspirin Will need echo   Best practice:  Diet: continue tube feeding Pain/Anxiety/Delirium protocol (if indicated): yes, target RASS score 0 to -1 VAP protocol (if indicated): Yes DVT prophylaxis lovenox GI prophylaxis: ppi Glucose control: per TRH Mobility: PT/OT consulted Code Status: full Family Communication: Attempted to call daughter on 6/3. No VM set up.  Disposition: remain in ICU  Labs   CBC: Recent Labs  Lab 04/09/19 0500 04/10/19 0457  04/11/19 0540 04/12/19 0438 04/12/19 0530 04/13/19 0530 04/14/19 0220 04/15/19 0411  WBC 3.8* 6.1  --  6.5  --  8.9 11.9* 10.0 10.0  NEUTROABS 2.7 3.9  --  5.7  --  8.1* 10.8*  --   --   HGB 13.8 14.4   < > 13.8 12.6* 13.0 14.0 14.5 15.4  HCT 42.7 46.2   < > 42.3 37.0*  41.1 43.3 45.0 47.8  MCV 84.1 84.5  --  83.8  --  84.2 83.9 83.0 84.2  PLT 91* 119*  --  124*  --  149* 183 204 206   < > = values in this interval not displayed.    Basic Metabolic Panel: Recent Labs  Lab 04/11/19 0540 04/11/19 1710  04/12/19 0500 04/12/19 0530 04/12/19 2130 04/13/19 0530 04/14/19 0220 04/15/19 0411  NA 138  --    < >  --  137 143 144 141 145  K 3.9  --    < >  --  4.7 3.5 3.9 3.2* 3.5  CL 101  --   --   --  104 101 104 101 103  CO2 25  --   --    --  24 30 30 31 30   GLUCOSE 222*  --   --   --  208* 149* 175* 223* 192*  BUN 30*  --   --   --  34* 34* 34* 40* 45*  CREATININE 1.28*  --   --   --  0.96 0.99 1.07 1.03 1.08  CALCIUM 8.0*  --   --   --  7.9* 8.2* 8.5* 8.1* 8.7*  MG 2.3 2.5*  --  2.7*  --  2.5*  --   --   --   PHOS 3.8 3.1  --  2.6  --   --   --   --   --    < > = values in this interval not displayed.   GFR: Estimated Creatinine Clearance: 74.2 mL/min (by C-G formula based on SCr of 1.08 mg/dL). Recent Labs  Lab 04/08/19 1235 04/08/19 2030  04/10/19 1526  04/12/19 0530 04/13/19 0530 04/14/19 0220 04/15/19 0411  PROCALCITON  --  1.90  --  1.30  --   --   --   --   --   WBC  --   --    < >  --    < > 8.9 11.9* 10.0 10.0  LATICACIDVEN 1.1  --   --   --   --   --   --   --   --    < > = values in this interval not displayed.    Liver Function Tests: Recent Labs  Lab 04/11/19 0540 04/12/19 0530 04/13/19 0530 04/14/19 0220 04/15/19 0411  AST 108* 67* 73* 55* 49*  ALT 90* 69* 76* 90* 85*  ALKPHOS 73 70 89 92 98  BILITOT 0.4 0.4 0.7 0.5 0.7  PROT 7.0 6.2* 6.7 6.7 7.2  ALBUMIN 2.6* 2.4* 2.5* 2.7* 2.8*   Recent Labs  Lab 04/08/19 0926  LIPASE 21   No results for input(s): AMMONIA in the last 168 hours.  ABG    Component Value Date/Time   PHART 7.395 04/12/2019 0438   PCO2ART 44.3 04/12/2019 0438   PO2ART 52.0 (L) 04/12/2019 0438   HCO3 27.3 04/12/2019 0438   TCO2 29 04/12/2019 0438   O2SAT 87.0 04/12/2019 0438     Coagulation Profile: No results for input(s): INR, PROTIME in the last 168 hours.  Cardiac Enzymes: No results for input(s): CKTOTAL, CKMB, CKMBINDEX, TROPONINI in the last 168 hours.  HbA1C: No results found for: HGBA1C  CBG: Recent Labs  Lab 04/14/19 1159 04/14/19 1628 04/14/19 1932 04/14/19 2354 04/15/19 0352  GLUCAP 198* 207* 220* 192* 213*     Critical care time:     Guinevere Scarlet A, DO 04/15/2019, 7:29 AM  Pager: [phone]

## 2019-04-15 NOTE — Progress Notes (Signed)
PROGRESS NOTE    Tymothy Cotton  MKL:[id number] DOB: February 25, 1971 DOA: 04/08/2019 PCP: Patient, No Pcp Per      Brief Narrative:  Mr. Ritzel is a 48 y.o. M with no significant PMHx who presented with 1 week fever, SOB, productive cough.    In the ER, CXR showed bilateral infiltrates.  SARS-CoV-2 NAA positive.  Admitted to floor.  Progressively hypoxic 5/30 requiring intubation.  Extubated 6/1, developed respiratory distress and new onset Afib and was reintubated.       Assessment & Plan:  Coronavirus pneumonitis with acute hypoxic respiratory failure In setting of ongoing 2020 COVID-19 pandemic.  S/p convalescent plasma 6/1.   S/p Azithro and ceftriaxone x5d. S/p remdesivir 5d 5/30 to 6/3  Inflammatory markers no change.  Solumedrol decreased yesterday.  -Continue Solu-medrol 2 days, then stop  -Continue Lovenox, ICU dosing, for VTE PPx -Continue Lasix 40 IV BID -Close monitoring I/Os, daily Cr, K -Start Duo-neb QID -Continue daily d-dimer, ferritin and CRP -Possible extubation tomorrow  COVID-19 Labs Recent Labs    04/13/19 0530 04/14/19 0220 04/15/19 0411  DDIMER 0.81* 0.89* 1.10*  FERRITIN 1,441* 950* 794*  LDH 519* 421*  --   CRP 6.9* 4.4* 5.3*        New onset atrial fibrillation No previous history.  CHA2DS2-Vasc 0.    Stable in sinus rhythm.  TSH low, T4 normal --> euthyroid sick.   Repeat TSH in 3 months. -Reduce therapeutic Lovenox to VTE PPx dosing -Start full dose aspirin -Hold amiodarone -Continue metoprolol -Obtain Echo when able  Hypokalemia Mag normal.   -Supplement K  Hyperglycemia Glucoses still >180.  Requiring 25-30 units insulin per day last 3 days. A1c shows 6.2%, pre-diabetes only. -Finish steroids tomorrow -Continue Lantus for now -Continue SSI corrections   Transaminitis Stable, from COVID.         MDM and disposition: The below labs and imaging reports were reviewed and summarized above.  Medication  management as above.  The patient was admitted with acute hypoxic respiratory failure form COVID-19.  He has developed hyperglycemia requiring adjustment of insulin as above, transaminitis from COVID, and new onset atrial fibrillation.  Anticoagulation management as above.        Ventilator best practice:  Diet: TFs VAP protocol (if indicated): Yes GI prophylaxis: Famotidine Glucose control: Insulin Mobility: PT  DVT prophylaxis: Lovenox Code Status: FULL     Consultants:   PCCM  Procedures:   5/30 ETT  6/1 extubation  6/1 ETT x2 (reintubation)  Antimicrobials:   Azithromycin 5/29>> 6/1  Ceftriaxone 5/28>> 6/1  Remdesivir 5/30 >> 6/3  Culture data:   5/28 blood culture x1 -- NG  5/30 respiratory culture --NG  MRSA nares negative      Subjective: Remains in sinus rhythm.  Remains afebrile.  No respiratory distress, confusion.  Objective: Vitals:   04/15/19 0735 04/15/19 0800 04/15/19 0900 04/15/19 0914  BP: 107/64 108/83 113/80   Pulse: 100 (!) 103 (!) 107   Resp: 19 (!) 22 (!) 22   Temp:    100.2 F (37.9 C)  TempSrc:      SpO2: 94% 92% 93%   Weight:      Height:        Intake/Output Summary (Last 24 hours) at 04/15/2019 1015 Last data filed at 04/15/2019 0941 Gross per 24 hour  Intake 924.42 ml  Output 3175 ml  Net -2250.58 ml   Filed Weights   04/11/19 0600 04/12/19 0500 04/13/19 0600  Weight:  73.6 kg 75.6 kg 74.9 kg    Examination:  General appearance: Thin adult male, affect flat, with ET tube. HEENT: Anicteric, conjunctive are normal, pupils normal, lids and lashes normal.  No nasal deformity, discharge, or epistaxis.  ET tube in place.    Skin: Warm and dry, no suspicious rashes or lesions. Cardiac: Tachycardic, regular, no murmurs, JVP normal, no lower extremity edema. Respiratory: Intubated, lung sounds coarse bilaterally, no wheezing.   Abdomen: Abdomen soft, no distention, no grimace to palpation. Neuro/Psych: Nods head,  makes eye contact, follows commands.  Pupils equal.     Data Reviewed: I have personally reviewed following labs and imaging studies:  CBC: Recent Labs  Lab 04/09/19 0500 04/10/19 0457  04/11/19 0540 04/12/19 0438 04/12/19 0530 04/13/19 0530 04/14/19 0220 04/15/19 0411  WBC 3.8* 6.1  --  6.5  --  8.9 11.9* 10.0 10.0  NEUTROABS 2.7 3.9  --  5.7  --  8.1* 10.8*  --   --   HGB 13.8 14.4   < > 13.8 12.6* 13.0 14.0 14.5 15.4  HCT 42.7 46.2   < > 42.3 37.0* 41.1 43.3 45.0 47.8  MCV 84.1 84.5  --  83.8  --  84.2 83.9 83.0 84.2  PLT 91* 119*  --  124*  --  149* 183 204 206   < > = values in this interval not displayed.   Basic Metabolic Panel: Recent Labs  Lab 04/11/19 0540 04/11/19 1710  04/12/19 0500 04/12/19 0530 04/12/19 2130 04/13/19 0530 04/14/19 0220 04/15/19 0411  NA 138  --    < >  --  137 143 144 141 145  K 3.9  --    < >  --  4.7 3.5 3.9 3.2* 3.5  CL 101  --   --   --  104 101 104 101 103  CO2 25  --   --   --  GLUCOSE 222*  --   --   --  208* 149* 175* 223* 192*  BUN 30*  --   --   --  34* 34* 34* 40* 45*  CREATININE 1.28*  --   --   --  0.96 0.99 1.07 1.03 1.08  CALCIUM 8.0*  --   --   --  7.9* 8.2* 8.5* 8.1* 8.7*  MG 2.3 2.5*  --  2.7*  --  2.5*  --   --   --   PHOS 3.8 3.1  --  2.6  --   --   --   --   --    < > = values in this interval not displayed.   GFR: Estimated Creatinine Clearance: 74.2 mL/min (by C-G formula based on SCr of 1.08 mg/dL). Liver Function Tests: Recent Labs  Lab 04/11/19 0540 04/12/19 0530 04/13/19 0530 04/14/19 0220 04/15/19 0411  AST 108* 67* 73* 55* 49*  ALT 90* 69* 76* 90* 85*  ALKPHOS 73 70 89 92 98  BILITOT 0.4 0.4 0.7 0.5 0.7  PROT 7.0 6.2* 6.7 6.7 7.2  ALBUMIN 2.6* 2.4* 2.5* 2.7* 2.8*   No results for input(s): LIPASE, AMYLASE in the last 168 hours. No results for input(s): AMMONIA in the last 168 hours. Coagulation Profile: No results for input(s): INR, PROTIME in the last 168 hours. Cardiac  Enzymes: No results for input(s): CKTOTAL, CKMB, CKMBINDEX, TROPONINI in the last 168 hours. BNP (last 3 results) No results for input(s): PROBNP in the last 8760 hours.  HbA1C: Recent Labs    04/15/19 0411  HGBA1C 6.2*   CBG: Recent Labs  Lab 04/14/19 1628 04/14/19 1932 04/14/19 2354 04/15/19 0352 04/15/19 0747  GLUCAP 207* 220* 192* 213* 178*   Lipid Profile: No results for input(s): CHOL, HDL, LDLCALC, TRIG, CHOLHDL, LDLDIRECT in the last 72 hours. Thyroid Function Tests: Recent Labs    04/15/19 0411  TSH 0.253*  FREET4 1.71   Anemia Panel: Recent Labs    04/14/19 0220 04/15/19 0411  FERRITIN 950* 794*   Urine analysis: No results found for: COLORURINE, APPEARANCEUR, LABSPEC, PHURINE, GLUCOSEU, HGBUR, BILIRUBINUR, KETONESUR, PROTEINUR, UROBILINOGEN, NITRITE, LEUKOCYTESUR Sepsis Labs: (procalcitonin:4,lacticacidven:4)  ) Recent Results (from the past 240 hour(s))  SARS Coronavirus 2 (CEPHEID- Performed in [facility] hospital lab), Hosp Order     Status: Abnormal   Collection Time: 04/08/19 10:23 AM  Result Value Ref Range Status   SARS Coronavirus 2 POSITIVE (A) NEGATIVE Final    Comment: RESULT CALLED TO, READ BACK BY AND VERIFIED WITH: Duanne Guess RN 12:10 04/08/19 ([email or web address]) (NOTE) If result is NEGATIVE SARS-CoV-2 target nucleic acids are NOT DETECTED. The SARS-CoV-2 RNA is generally detectable in upper and lower  respiratory specimens during the acute phase of infection. The lowest  concentration of SARS-CoV-2 viral copies this assay can detect is 250  copies / mL. A negative result does not preclude SARS-CoV-2 infection  and should not be used as the sole basis for treatment or other  patient management decisions.  A negative result may occur with  improper specimen collection / handling, submission of specimen other  than nasopharyngeal swab, presence of viral mutation(s) within the  areas targeted by this assay, and inadequate number of  viral copies  (<250 copies / mL). A negative result must be combined with clinical  observations, patient history, and epidemiological information. If result is POSITIVE SARS-CoV-2 target nucleic acids are DETECTED.  The SARS-CoV-2 RNA is generally detectable in upper and lower  respiratory specimens during the acute phase of infection.  Positive  results are indicative of active infection with SARS-CoV-2.  Clinical  correlation with patient history and other diagnostic information is  necessary to determine patient infection status.  Positive results do  not rule out bacterial infection or co-infection with other viruses. If result is PRESUMPTIVE POSTIVE SARS-CoV-2 nucleic acids MAY BE PRESENT.   A presumptive positive result was obtained on the submitted specimen  and confirmed on repeat testing.  While 2019 novel coronavirus  (SARS-CoV-2) nucleic acids may be present in the submitted sample  additional confirmatory testing may be necessary for epidemiological  and / or clinical management purposes  to differentiate between  SARS-CoV-2 and other Sarbecovirus currently known to infect humans.  If clinically indicated additional testing with an alternate test  methodology [phone])  is advised. The SARS-CoV-2 RNA is generally  detectable in upper and lower respiratory specimens during the acute  phase of infection. The expected result is Negative. Fact Sheet for Patients:  BoilerBrush.com.cy Fact Sheet for Healthcare Providers: https://pope.com/ This test is not yet approved or cleared by the [place] FDA and has been authorized for detection and/or diagnosis of SARS-CoV-2 by FDA under an Emergency Use Authorization (EUA).  This EUA will remain in effect (meaning this test can be used) for the duration of the COVID-19 declaration under Section 564(b)(1) of the Act, 21 U.S.C. section 360bbb-3(b)(1), unless the authorization is  terminated or revoked sooner. Performed at [facility], [address], [place], [place] [address]  Blood culture (routine x 2)     Status: None   Collection Time: 04/08/19 11:15 AM  Result Value Ref Range Status   Specimen Description BLOOD RIGHT ANTECUBITAL  Final   Special Requests   Final    BOTTLES DRAWN AEROBIC AND ANAEROBIC Blood Culture adequate volume   Culture   Final    NO GROWTH 5 DAYS Performed at [facility], [address] [place], [place] [address]    Report Status 04/13/2019 FINAL  Final  Expectorated sputum assessment w rflx to resp cult     Status: None   Collection Time: 04/10/19  3:07 PM  Result Value Ref Range Status   Specimen Description SPUTUM  Final   Special Requests NONE  Final   Sputum evaluation   Final    THIS SPECIMEN IS ACCEPTABLE FOR SPUTUM CULTURE Performed at [facility], [address] [place], [place] [address]    Report Status 04/11/2019 FINAL  Final  Culture, respiratory     Status: None   Collection Time: 04/10/19  3:07 PM  Result Value Ref Range Status   Specimen Description   Final    SPUTUM Performed at [facility], [address] [place], [place] [address]    Special Requests   Final    NONE Reflexed from [id number] Performed at [facility], [address] [place], [place] [address]    Gram Stain   Final    MODERATE WBC PRESENT, PREDOMINANTLY PMN NO ORGANISMS SEEN    Culture   Final    NO GROWTH 2 DAYS Performed at [facility], [address] [place], [place] [address]    Report Status 04/13/2019 FINAL  Final  MRSA PCR Screening     Status: None   Collection Time: 04/13/19  1:26 PM  Result Value Ref Range Status   MRSA by PCR NEGATIVE NEGATIVE Final    Comment:        The GeneXpert MRSA Assay (FDA approved for NASAL specimens only), is one component of a comprehensive MRSA colonization surveillance program. It is not intended to  diagnose MRSA infection nor to guide or monitor treatment for MRSA infections. Performed at [facility], [address], [place], [place] [address]          Radiology Studies: No results found.      Scheduled Meds: . sodium chloride   Intravenous Once  . chlorhexidine  15 mL Mouth/Throat BID  . Chlorhexidine Gluconate Cloth  6 each Topical Daily  . dextromethorphan  30 mg Per Tube BID   And  . guaiFENesin  15 mL Per Tube Q6H  . enoxaparin (LOVENOX) injection  75 mg Subcutaneous Q12H  . famotidine  20 mg Oral BID  . insulin aspart  0-9 Units Subcutaneous Q4H  . insulin aspart  2 Units Subcutaneous Q4H  . insulin glargine  12 Units Subcutaneous QHS  . mouth rinse  15 mL Mouth Rinse 10 times per day  . methylPREDNISolone (SOLU-MEDROL) injection  40 mg Intravenous Daily  . metoprolol tartrate  12.5 mg Oral BID  . multivitamin  15 mL Per Tube Daily  . potassium chloride  40 mEq Oral BID  . sodium chloride flush  3 mL Intravenous Q12H   Continuous Infusions: . dexmedetomidine (PRECEDEX) IV infusion Stopped (04/14/19 0900)  . feeding supplement (VITAL AF 1.2 CAL) 1,000 mL (04/15/19 0800)  . fentaNYL infusion INTRAVENOUS 150 mcg/hr (04/15/19 0800)  LOS: 7 days    Time spent: 35 minutes  During this encounter: Patient Isolation: Airborne, contact, droplet HCP PPE: Face shield, head covering, N95, gown, gloves, and shoe covers    Alberteen Samhristopher P Senia Even, MD [facility] 04/15/2019, 10:15 AM     Please page through AMION:  [email or web address] If 7PM-7AM, please contact night-coverage

## 2019-04-15 NOTE — Evaluation (Signed)
Physical Therapy Evaluation Patient Details Name: Melvin Mills MRN: [id number] DOB: Aug 20, 1971 Today's Date: 04/15/2019   History of Present Illness  48 y.o. male admitted on 04/08/19 with fever.  Pt dx with COVID-19.  Pt became progressively hypoxic requiring intubation 04/10/19 attempted extubation 04/12/19, but developed new onset a-fib and respiratory distress and had to be re-intubated on the same day.  Pt with no other listed PMH on file.   Pt also with hypokalemia and hyperglycemia.    Clinical Impression  Pt was able to sit for quite a while EOB with support at his trunk as he fatigued.  He even stood EOB and side stepped to get higher all while on PS/CPAP mode FiO2 50% PEEP 5.   VSS throughout.  Some coughing with changes in position, but not in acute distress.   PT to follow acutely for deficits listed below.      Follow Up Recommendations Home health PT;Supervision for mobility/OOB    Equipment Recommendations  None recommended by PT    Recommendations for Other Services   NA    Precautions / Restrictions Precautions Precautions: Fall;Other (comment)(ET tube, NGT) Precaution Comments: monitor vitals      Mobility  Bed Mobility Overal bed mobility: Needs Assistance Bed Mobility: Rolling;Sidelying to Sit;Sit to Sidelying Rolling: Mod assist Sidelying to sit: Mod assist;+2 for physical assistance     Sit to sidelying: Mod assist;+2 for physical assistance General bed mobility comments: Mod assist to support trunk during transitions, hand over hand to reach for railing and maintain grasp. Assist needed to lift legs and assist in controlling trunk down to sidelying and transition to supine, pt helping more at his trunk and less at his legs with return to supine likely due to fatigue.   Transfers Overall transfer level: Needs assistance   Transfers: Sit to/from Stand Sit to Stand: Mod assist;+2 physical assistance         General transfer comment: Able to side step 2  steps toward [facility] with two person mod assist, knees blocked for safety.  Support to power up and stabilize in standing.  Pt stood for ~ 2 mins.          Balance Overall balance assessment: Needs assistance Sitting-balance support: Feet supported Sitting balance-Leahy Scale: Fair Sitting balance - Comments: able to sit unsupported; increased support with fatigue   Standing balance support: Bilateral upper extremity supported Standing balance-Leahy Scale: Poor Standing balance comment: reliant on external support                             Pertinent Vitals/Pain Pain Assessment: Faces Faces Pain Scale: Hurts a little bit Pain Location: discomfort with moving with ET tube Pain Descriptors / Indicators: Discomfort Pain Intervention(s): Limited activity within patient's tolerance;Monitored during session;Repositioned    Home Living Family/patient expects to be discharged to:: Private residence Living Arrangements: Children Available Help at Discharge: Family             Additional Comments: Other people in home are sick with Covid; will get home set up once extubated    Prior Function Level of Independence: Independent               Hand Dominance   Dominant Hand: Right    Extremity/Trunk Assessment   Upper Extremity Assessment Upper Extremity Assessment: Defer to OT evaluation    Lower Extremity Assessment Lower Extremity Assessment: Generalized weakness(grossly 3-/5)    Cervical / Trunk Assessment Cervical /  Trunk Assessment: Normal  Communication   Communication: Prefers language other than English(but able to understand for eval)  Cognition Arousal/Alertness: Awake/alert Behavior During Therapy: WFL for tasks assessed/performed Overall Cognitive Status: Difficult to assess                                 [facility]      General Comments General comments (skin integrity, edema, etc.): VSS throughout.   Some coughing while upright, but pt tolerating PS/CPAP on vent with FiO2 50%, PEEP 5, HR 110, BP stable, RR and O2 stable while weaning.     Exercises General Exercises - Upper Extremity Shoulder Flexion: AAROM;AROM;Both;10 reps;Seated Elbow Flexion: AROM;AAROM;Both;10 reps;Seated Elbow Extension: AROM;AAROM;Both;10 reps;Seated Digit Composite Flexion: AROM;Both;10 reps   Assessment/Plan    PT Assessment Patient needs continued PT services  PT Problem List Decreased strength;Decreased activity tolerance;Decreased balance;Decreased mobility;Decreased knowledge of use of DME;Decreased knowledge of precautions;Cardiopulmonary status limiting activity       PT Treatment Interventions DME instruction;Gait training;Functional mobility training;Therapeutic activities;Therapeutic exercise;Balance training;Patient/family education    PT Goals (Current goals can be found in the Care Plan section)  Acute Rehab PT Goals Patient Stated Goal: nodd "yes" to getting stronger  PT Goal Formulation: With patient Time For Goal Achievement: 04/29/19 Potential to Achieve Goals: Good    Frequency Min 5X/week        Co-evaluation PT/OT/SLP Co-Evaluation/Treatment: Yes Reason for Co-Treatment: Necessary to address cognition/behavior during functional activity;Complexity of the patient's impairments (multi-system involvement);For patient/therapist safety;To address functional/ADL transfers PT goals addressed during session: Mobility/safety with mobility;Balance;Strengthening/ROM OT goals addressed during session: ADL's and self-care;Strengthening/ROM       AM-PAC PT "6 Clicks" Mobility  Outcome Measure Help needed turning from your back to your side while in a flat bed without using bedrails?: A Lot Help needed moving from lying on your back to sitting on the side of a flat bed without using bedrails?: A Lot Help needed moving to and from a bed to a chair (including a wheelchair)?: A Lot Help needed  standing up from a chair using your arms (e.g., wheelchair or bedside chair)?: A Lot Help needed to walk in hospital room?: A Lot Help needed climbing 3-5 steps with a railing? : Total 6 Click Score: 11    End of Session Equipment Utilized During Treatment: Oxygen;Other (comment)(vent) Activity Tolerance: Patient limited by fatigue Patient left: in bed;with nursing/sitter in room(in bed in fully upright chair mode.)   PT Visit Diagnosis: Muscle weakness (generalized) (M62.81);Difficulty in walking, not elsewhere classified (R26.2)    Time: 1450-1530 PT Time Calculation (min) (ACUTE ONLY): 40 min   Charges:         Lurena Joinerebecca B. Shavonta Gossen, PT, DPT  Acute Rehabilitation [phone] pager #[phone] office   PT Evaluation $PT Eval High Complexity: 1 High PT Treatments $Therapeutic Activity: 8-22 mins      04/15/2019, 4:33 PM

## 2019-04-15 NOTE — Progress Notes (Signed)
Update called to Hmit the patients daughter, all questions answered.

## 2019-04-16 LAB — CBC
HCT: 48.8 % (ref 39.0–52.0)
Hemoglobin: 15.3 g/dL (ref 13.0–17.0)
MCH: 26.8 pg (ref 26.0–34.0)
MCHC: 31.4 g/dL (ref 30.0–36.0)
MCV: 85.6 fL (ref 80.0–100.0)
Platelets: 207 10*3/uL (ref 150–400)
RBC: 5.7 MIL/uL (ref 4.22–5.81)
RDW: 14.6 % (ref 11.5–15.5)
WBC: 9.2 10*3/uL (ref 4.0–10.5)
nRBC: 0 % (ref 0.0–0.2)

## 2019-04-16 LAB — COMPREHENSIVE METABOLIC PANEL
ALT: 56 U/L — ABNORMAL HIGH (ref 0–44)
AST: 21 U/L (ref 15–41)
Albumin: 2.8 g/dL — ABNORMAL LOW (ref 3.5–5.0)
Alkaline Phosphatase: 97 U/L (ref 38–126)
Anion gap: 9 (ref 5–15)
BUN: 49 mg/dL — ABNORMAL HIGH (ref 6–20)
CO2: 32 mmol/L (ref 22–32)
Calcium: 8.8 mg/dL — ABNORMAL LOW (ref 8.9–10.3)
Chloride: 104 mmol/L (ref 98–111)
Creatinine, Ser: 1.09 mg/dL (ref 0.61–1.24)
GFR calc Af Amer: >60 mL/min (ref >60)
GFR calc non Af Amer: >60 mL/min (ref >60)
Glucose, Bld: 241 mg/dL — ABNORMAL HIGH (ref 70–99)
Potassium: 4.2 mmol/L (ref 3.5–5.1)
Sodium: 145 mmol/L (ref 135–145)
Total Bilirubin: 1 mg/dL (ref 0.3–1.2)
Total Protein: 7.4 g/dL (ref 6.5–8.1)

## 2019-04-16 LAB — GLUCOSE, CAPILLARY
Glucose-Capillary: 224 mg/dL — ABNORMAL HIGH (ref 70–99)
Glucose-Capillary: 225 mg/dL — ABNORMAL HIGH (ref 70–99)
Glucose-Capillary: 220 mg/dL — ABNORMAL HIGH (ref 70–99)
Glucose-Capillary: 189 mg/dL — ABNORMAL HIGH (ref 70–99)
Glucose-Capillary: 150 mg/dL — ABNORMAL HIGH (ref 70–99)
Glucose-Capillary: 112 mg/dL — ABNORMAL HIGH (ref 70–99)

## 2019-04-16 LAB — MAGNESIUM: Magnesium: 2.7 mg/dL — ABNORMAL HIGH (ref 1.7–2.4)

## 2019-04-16 LAB — C-REACTIVE PROTEIN: CRP: 19.9 mg/dL — ABNORMAL HIGH (ref <1.0)

## 2019-04-16 LAB — D-DIMER, QUANTITATIVE: D-Dimer, Quant: 0.82 ug/mL-FEU — ABNORMAL HIGH (ref 0.00–0.50)

## 2019-04-16 LAB — FERRITIN: Ferritin: 924 ng/mL — ABNORMAL HIGH (ref 24–336)

## 2019-04-16 MED ORDER — FUROSEMIDE 10 MG/ML IJ SOLN
40.0000 mg | Freq: Two times a day (BID) | INTRAMUSCULAR | Status: AC
  Administered 2019-04-16 (×2): 40 mg via INTRAVENOUS
  Filled 2019-04-16 (×2): qty 4

## 2019-04-16 MED ORDER — POTASSIUM CHLORIDE 10 MEQ/100ML IV SOLN
10.0000 meq | INTRAVENOUS | Status: AC
  Administered 2019-04-16 (×2): 10 meq via INTRAVENOUS
  Filled 2019-04-16 (×2): qty 100

## 2019-04-16 MED ORDER — IPRATROPIUM-ALBUTEROL 20-100 MCG/ACT IN AERS
1.0000 | INHALATION_SPRAY | Freq: Four times a day (QID) | RESPIRATORY_TRACT | Status: DC
  Administered 2019-04-16 – 2019-04-26 (×37): 1 via RESPIRATORY_TRACT
  Filled 2019-04-16 (×2): qty 4

## 2019-04-16 MED ORDER — INSULIN GLARGINE 100 UNIT/ML ~~LOC~~ SOLN
14.0000 [IU] | Freq: Every day | SUBCUTANEOUS | Status: DC
  Administered 2019-04-16: 22:00:00 14 [IU] via SUBCUTANEOUS
  Filled 2019-04-16: qty 0.14

## 2019-04-16 MED ORDER — ORAL CARE MOUTH RINSE
15.0000 mL | Freq: Two times a day (BID) | OROMUCOSAL | Status: DC
  Administered 2019-04-16 – 2019-04-30 (×24): 15 mL via OROMUCOSAL

## 2019-04-16 MED ORDER — METOPROLOL TARTRATE 5 MG/5ML IV SOLN
5.0000 mg | Freq: Two times a day (BID) | INTRAVENOUS | Status: DC
  Administered 2019-04-16 – 2019-04-17 (×2): 5 mg via INTRAVENOUS
  Filled 2019-04-16 (×2): qty 5

## 2019-04-16 MED ORDER — SODIUM CHLORIDE 0.9 % IV SOLN
INTRAVENOUS | Status: DC | PRN
  Administered 2019-04-16: 16:00:00 via INTRAVENOUS

## 2019-04-16 MED ORDER — ACETAMINOPHEN 650 MG RE SUPP
650.0000 mg | Freq: Four times a day (QID) | RECTAL | Status: DC | PRN
  Filled 2019-04-16: qty 1

## 2019-04-16 MED ORDER — POTASSIUM CHLORIDE 20 MEQ PO PACK
20.0000 meq | PACK | Freq: Once | ORAL | Status: DC
  Filled 2019-04-16: qty 1

## 2019-04-16 MED ORDER — PHENOL 1.4 % MT LIQD
1.0000 | OROMUCOSAL | Status: DC | PRN
  Administered 2019-04-16 – 2019-04-28 (×3): 1 via OROMUCOSAL
  Filled 2019-04-16: qty 177

## 2019-04-16 MED ORDER — INSULIN ASPART 100 UNIT/ML ~~LOC~~ SOLN
3.0000 [IU] | SUBCUTANEOUS | Status: DC
  Administered 2019-04-16 – 2019-04-21 (×19): 3 [IU] via SUBCUTANEOUS

## 2019-04-16 MED ORDER — MORPHINE SULFATE (PF) 2 MG/ML IV SOLN
4.0000 mg | INTRAVENOUS | Status: DC | PRN
  Administered 2019-04-17: 01:00:00 4 mg via INTRAVENOUS
  Filled 2019-04-16: qty 2

## 2019-04-16 MED ORDER — LIP MEDEX EX OINT
TOPICAL_OINTMENT | CUTANEOUS | Status: DC | PRN
  Filled 2019-04-16: qty 7

## 2019-04-16 NOTE — Progress Notes (Signed)
PROGRESS NOTE    Melvin Mills  UEA:[id number] DOB: 06/19/1971 DOA: 04/08/2019 PCP: Patient, No Pcp Per      Brief Narrative:  Melvin Mills is a 48 y.o. M with no significant PMHx who presented with 1 week fever, SOB, productive cough.    In the ER, CXR showed bilateral infiltrates.  SARS-CoV-2 NAA positive.  Admitted to floor.  Progressively hypoxic 5/30 requiring intubation.  Extubated 6/1, developed respiratory distress and new onset Afib and was reintubated.       Assessment & Plan:  Coronavirus pneumonitis with acute hypoxic respiratory failure In setting of ongoing 2020 COVID-19 pandemic.  S/p convalescent plasma 6/1.   S/p Azithro and ceftriaxone x5d. S/p remdesivir 5d 5/30 to 6/3  CRP up substantially, other inflammatory markers no change.  Diuresed well yesterday.  No fever.  -Finish Solu-Medrol today -Continue Lovenox, ICU dosing, for VTE PPx -Continue Lasix 40 IV BID -Continue close monitoring I/Os, daily Cr, K -Continue duo-neb QID -Continue daily d-dimer, ferritin and CRP -Pulmonology plan extubation today  COVID-19 Labs Recent Labs    04/14/19 0220 04/15/19 0411 04/16/19 0550  DDIMER 0.89* 1.10* 0.82*  FERRITIN 950* 794* 924*  LDH 421*  --   --   CRP 4.4* 5.3* 19.9*        New onset atrial fibrillation No previous history.  CHA2DS2-Vasc 0.    Remains in stable sinus rhythm.  TSH low, T4 normal --> euthyroid sick.   Repeat TSH in 3 months. -Continue aspirin -Hold amiodarone -Continue metoprolol -Obtain Echo when able  Hypokalemia Potassium normal today -Continue K while diuresing  Hyperglycemia Glucoses still somewhat elevated. A1c shows 6.2%, pre-diabetes only. -Stop steroids -Continue Lantus -Continue SSI corrections   Transaminitis Stable, from COVID.         MDM and disposition: The below labs and imaging reports were reviewed and summarized above.  Medication management as above.  The patient was admitted with  acute hypoxic respiratory failure form COVID-19.  He has developed hyperglycemia requiring frequent adjustment of insulin as above, transaminitis from COVID, and new onset atrial fibrillation.  Anticoagulation is managed.        Ventilator best practice:  Diet: TFs VAP protocol (if indicated): Yes GI prophylaxis: Famotidine Glucose control: Insulin Mobility: PT  DVT prophylaxis: Lovenox Code Status: FULL     Consultants:   PCCM  Procedures:   5/30 ETT  6/1 extubation  6/1 ETT x2 (reintubation)  Antimicrobials:   Azithromycin 5/29>> 6/1  Ceftriaxone 5/28>> 6/1  Remdesivir 5/30 >> 6/3  Culture data:   5/28 blood culture x1 -- NG  5/30 respiratory culture --NG  MRSA nares negative      Subjective: Stable in sinus rhythm, able to wean, appears able to take good inspirations on pressure support.  No fever.  Objective: Vitals:   04/16/19 1100 04/16/19 1132 04/16/19 1200 04/16/19 1300  BP: 118/86 118/86 113/90 (!) 107/94  Pulse: 87 84 89 90  Resp: (!) 31 (!) 33 (!) 27 (!) 25  Temp:      TempSrc:      SpO2: 93% 92% 93% 95%  Weight:      Height:        Intake/Output Summary (Last 24 hours) at 04/16/2019 1339 Last data filed at 04/16/2019 1300 Gross per 24 hour  Intake 1758.92 ml  Output 3225 ml  Net -1466.08 ml   Filed Weights   04/11/19 0600 04/12/19 0500 04/13/19 0600  Weight: 73.6 kg 75.6 kg 74.9 kg  Examination:  General appearance: Thin adult male, affect flat, no acute distress, on ventilator. HEENT: Anicteric, conjunctival pink, lids and lashes normal, no nasal deformity, discharge, or epistaxis.  Lips normal, ET tube in place.  Hearing normal.    Skin: Warm and dry, no suspicious rashes or lesions. Cardiac: Cardiac, regular, no murmurs, JVP normal, no lower extremity edema. Respiratory: Intubated, lung sounds diminished bilaterally, no wheezing. Abdomen: Abdomen soft, no distention, no tenderness to palpation, no hepatosplenomegaly..  Neuro/Psych: Lifts head, makes eye contact, follows commands.     Data Reviewed: I have personally reviewed following labs and imaging studies:  CBC: Recent Labs  Lab 04/10/19 0457  04/11/19 0540  04/12/19 0530 04/13/19 0530 04/14/19 0220 04/15/19 0411 04/16/19 0550  WBC 6.1  --  6.5  --  8.9 11.9* 10.0 10.0 9.2  NEUTROABS 3.9  --  5.7  --  8.1* 10.8*  --   --   --   HGB 14.4   < > 13.8   < > 13.0 14.0 14.5 15.4 15.3  HCT 46.2   < > 42.3   < > 41.1 43.3 45.0 47.8 48.8  MCV 84.5  --  83.8  --  84.2 83.9 83.0 84.2 85.6  PLT 119*  --  124*  --  149* 183 204 206 207   < > = values in this interval not displayed.   Basic Metabolic Panel: Recent Labs  Lab 04/11/19 0540 04/11/19 1710  04/12/19 0500  04/12/19 2130 04/13/19 0530 04/14/19 0220 04/15/19 0411 04/16/19 0550  NA 138  --    < >  --    < > 143 144 141 145 145  K 3.9  --    < >  --    < > 3.5 3.9 3.2* 3.5 4.2  CL 101  --   --   --    < > 101 104 101 103 104  CO2 25  --   --   --    < > 32  GLUCOSE 222*  --   --   --    < > 149* 175* 223* 192* 241*  BUN 30*  --   --   --    < > 34* 34* 40* 45* 49*  CREATININE 1.28*  --   --   --    < > 0.99 1.07 1.03 1.08 1.09  CALCIUM 8.0*  --   --   --    < > 8.2* 8.5* 8.1* 8.7* 8.8*  MG 2.3 2.5*  --  2.7*  --  2.5*  --   --   --  2.7*  PHOS 3.8 3.1  --  2.6  --   --   --   --   --   --    < > = values in this interval not displayed.   GFR: Estimated Creatinine Clearance: 73.5 mL/min (by C-G formula based on SCr of 1.09 mg/dL). Liver Function Tests: Recent Labs  Lab 04/12/19 0530 04/13/19 0530 04/14/19 0220 04/15/19 0411 04/16/19 0550  AST 67* 73* 55* 49* 21  ALT 69* 76* 90* 85* 56*  ALKPHOS 70 89 92 98 97  BILITOT 0.4 0.7 0.5 0.7 1.0  PROT 6.2* 6.7 6.7 7.2 7.4  ALBUMIN 2.4* 2.5* 2.7* 2.8* 2.8*   No results for input(s): LIPASE, AMYLASE in the last 168 hours. No results for input(s): AMMONIA in the last 168 hours. Coagulation Profile: No results for  input(s): INR,  PROTIME in the last 168 hours. Cardiac Enzymes: No results for input(s): CKTOTAL, CKMB, CKMBINDEX, TROPONINI in the last 168 hours. BNP (last 3 results) No results for input(s): PROBNP in the last 8760 hours. HbA1C: Recent Labs    04/15/19 0411  HGBA1C 6.2*   CBG: Recent Labs  Lab 04/15/19 1921 04/15/19 2323 04/16/19 0400 04/16/19 0743 04/16/19 1219  GLUCAP 321* 265* 224* 225* 220*   Lipid Profile: No results for input(s): CHOL, HDL, LDLCALC, TRIG, CHOLHDL, LDLDIRECT in the last 72 hours. Thyroid Function Tests: Recent Labs    04/15/19 0411  TSH 0.253*  FREET4 1.71   Anemia Panel: Recent Labs    04/15/19 0411 04/16/19 0550  FERRITIN 794* 924*   Urine analysis: No results found for: COLORURINE, APPEARANCEUR, LABSPEC, PHURINE, GLUCOSEU, HGBUR, BILIRUBINUR, KETONESUR, PROTEINUR, UROBILINOGEN, NITRITE, LEUKOCYTESUR Sepsis Labs: (procalcitonin:4,lacticacidven:4)  ) Recent Results (from the past 240 hour(s))  SARS Coronavirus 2 (CEPHEID- Performed in [facility] hospital lab), Hosp Order     Status: Abnormal   Collection Time: 04/08/19 10:23 AM  Result Value Ref Range Status   SARS Coronavirus 2 POSITIVE (A) NEGATIVE Final    Comment: RESULT CALLED TO, READ BACK BY AND VERIFIED WITH: Duanne Guess RN 12:10 04/08/19 ([email or web address]) (NOTE) If result is NEGATIVE SARS-CoV-2 target nucleic acids are NOT DETECTED. The SARS-CoV-2 RNA is generally detectable in upper and lower  respiratory specimens during the acute phase of infection. The lowest  concentration of SARS-CoV-2 viral copies this assay can detect is 250  copies / mL. A negative result does not preclude SARS-CoV-2 infection  and should not be used as the sole basis for treatment or other  patient management decisions.  A negative result may occur with  improper specimen collection / handling, submission of specimen other  than nasopharyngeal swab, presence of viral mutation(s) within the   areas targeted by this assay, and inadequate number of viral copies  (<250 copies / mL). A negative result must be combined with clinical  observations, patient history, and epidemiological information. If result is POSITIVE SARS-CoV-2 target nucleic acids are DETECTED.  The SARS-CoV-2 RNA is generally detectable in upper and lower  respiratory specimens during the acute phase of infection.  Positive  results are indicative of active infection with SARS-CoV-2.  Clinical  correlation with patient history and other diagnostic information is  necessary to determine patient infection status.  Positive results do  not rule out bacterial infection or co-infection with other viruses. If result is PRESUMPTIVE POSTIVE SARS-CoV-2 nucleic acids MAY BE PRESENT.   A presumptive positive result was obtained on the submitted specimen  and confirmed on repeat testing.  While 2019 novel coronavirus  (SARS-CoV-2) nucleic acids may be present in the submitted sample  additional confirmatory testing may be necessary for epidemiological  and / or clinical management purposes  to differentiate between  SARS-CoV-2 and other Sarbecovirus currently known to infect humans.  If clinically indicated additional testing with an alternate test  methodology [phone])  is advised. The SARS-CoV-2 RNA is generally  detectable in upper and lower respiratory specimens during the acute  phase of infection. The expected result is Negative. Fact Sheet for Patients:  BoilerBrush.com.cy Fact Sheet for Healthcare Providers: https://pope.com/ This test is not yet approved or cleared by the [place] FDA and has been authorized for detection and/or diagnosis of SARS-CoV-2 by FDA under an Emergency Use Authorization (EUA).  This EUA will remain in effect (meaning this test can be used) for the duration of  the COVID-19 declaration under Section 564(b)(1) of the Act, 21 U.S.C.  section 360bbb-3(b)(1), unless the authorization is terminated or revoked sooner. Performed at [facility], [address] [place], [place] [address]   Blood culture (routine x 2)     Status: None   Collection Time: 04/08/19 11:15 AM  Result Value Ref Range Status   Specimen Description BLOOD RIGHT ANTECUBITAL  Final   Special Requests   Final    BOTTLES DRAWN AEROBIC AND ANAEROBIC Blood Culture adequate volume   Culture   Final    NO GROWTH 5 DAYS Performed at [facility], [address] [place], [place] [address]    Report Status 04/13/2019 FINAL  Final  Expectorated sputum assessment w rflx to resp cult     Status: None   Collection Time: 04/10/19  3:07 PM  Result Value Ref Range Status   Specimen Description SPUTUM  Final   Special Requests NONE  Final   Sputum evaluation   Final    THIS SPECIMEN IS ACCEPTABLE FOR SPUTUM CULTURE Performed at [facility], [address], [place], [place] [address]    Report Status 04/11/2019 FINAL  Final  Culture, respiratory     Status: None   Collection Time: 04/10/19  3:07 PM  Result Value Ref Range Status   Specimen Description   Final    SPUTUM Performed at [name], [address] [place], [place] [address]    Special Requests   Final    NONE Reflexed from [id number] Performed at [facility], [address], [place], [place] [address]    Gram Stain   Final    MODERATE WBC PRESENT, PREDOMINANTLY PMN NO ORGANISMS SEEN    Culture   Final    NO GROWTH 2 DAYS Performed at [facility], [address], [place], [place] [address]    Report Status 04/13/2019 FINAL  Final  MRSA PCR Screening     Status: None   Collection Time: 04/13/19  1:26 PM  Result Value Ref Range Status   MRSA by PCR NEGATIVE NEGATIVE Final    Comment:        The GeneXpert MRSA Assay (FDA approved for NASAL specimens only), is one component of a comprehensive MRSA colonization  surveillance program. It is not intended to diagnose MRSA infection nor to guide or monitor treatment for MRSA infections. Performed at [facility], [address] [place], [place] [address]          Radiology Studies: No results found.      Scheduled Meds: . sodium chloride   Intravenous Once  . aspirin  325 mg Oral Daily  . chlorhexidine  15 mL Mouth/Throat BID  . Chlorhexidine Gluconate Cloth  6 each Topical Daily  . dextromethorphan  30 mg Per Tube BID   And  . guaiFENesin  15 mL Per Tube Q6H  . enoxaparin (LOVENOX) injection  40 mg Subcutaneous Q12H  . famotidine  20 mg Oral BID  . furosemide  40 mg Intravenous BID  . insulin aspart  0-9 Units Subcutaneous Q4H  . insulin aspart  3 Units Subcutaneous Q4H  . insulin glargine  14 Units Subcutaneous QHS  . ipratropium-albuterol  3 mL Nebulization Q6H  . mouth rinse  15 mL Mouth Rinse 10 times per day  . metoprolol tartrate  12.5 mg Oral BID  . multivitamin  15 mL Per Tube Daily  . sodium  chloride flush  3 mL Intravenous Q12H   Continuous Infusions:    LOS: 8 days    Time spent: 35 minutes  During this encounter: Patient Isolation: Airborne, contact, droplet HCP PPE: Face shield, head covering, N95, gown, gloves, and shoe covers    Alberteen Sam, MD [facility] 04/16/2019, 1:39 PM     Please page through AMION:  [email or web address] Password [email or web address] If 7PM-7AM, please contact night-coverage

## 2019-04-16 NOTE — Progress Notes (Signed)
Patient alert and oriented on bedside exam. Currently off heated high flow and tolerating [place]. Ok for bedside swallow evaluation. Pending results, advance diet to CLD  Mechele Collin, M.D. [facility] Pulmonary/Critical Care Medicine Pager: [phone] After hours pager: [phone]

## 2019-04-16 NOTE — Progress Notes (Signed)
Daughter Hmit updated via phone

## 2019-04-16 NOTE — Progress Notes (Signed)
OT Cancellation Note  Patient Details Name: Melvin Mills MRN: [id number] DOB: 1971/03/22   Cancelled Treatment:    Reason Eval/Treat Not Completed: Medical issues which prohibited therapy.Per nursing pt desating with increased RR. Will hold at this time.   Thornell Mule, OT/L   Acute OT Clinical Specialist Acute Rehabilitation Services Pager [phone] Office [phone]  04/16/2019, 3:24 PM

## 2019-04-16 NOTE — Progress Notes (Signed)
Patient not tolerating [place] HFNC with liter flow reduced to 15.  RT placed patient on a [place] HFNC 13 L, sats 95%. Patient tolerating well at this time.  RN at bedside.

## 2019-04-16 NOTE — Progress Notes (Signed)
NAME:  Melvin Mills, MRN:  [id number], DOB:  Mar 13, 1971, LOS: 8 ADMISSION DATE:  04/08/2019, CONSULTATION DATE: Apr 10, 2019 REFERRING MD: Dr. Darlyn Read, CHIEF COMPLAINT: Dyspnea  Brief History   48 year old male with no past medical history was admitted on Apr 08, 2019 with COVID pneumonia causing acute respiratory failure with hypoxemia.  He required intubation and mechanical ventilation on Apr 10, 2019.  Past Medical History  none  Significant Hospital Events   5/28 admitted 5/30 transferred to ICU and intubated 6/1 Extubated, developed atrial fibrillation with RVR, re-intubated  Consults:  Pulmonary and critical care medicine  Procedures:  5/30 > Intubated 5/31 > Extubated and reintubated  Significant Diagnostic Tests:    Micro Data:  May 28 SARS-CoV-2 positive May 28 blood culture no growth to date  Antimicrobials/COVID treatment  May 28 ceftriaxone > 6/1 May 29 azithromycin > 6/1 May 30 remdesivir > 6/3 June 1 convalescent plasma  Interim history/subjective:  Weaning. Worked with PT yesterday. Awake and following commands. Discussed possible extubation today, and he is hoping that this can happen.   Objective   Blood pressure (!) 126/98, pulse 99, temperature 98.7 F (37.1 C), temperature source Axillary, resp. rate 19, height  (1.6 m), weight 74.9 kg, SpO2 95 %.    Vent Mode: PRVC FiO2 (%):  [50 %-60 %] 50 % Set Rate:  [30 bmp] 30 bmp Vt Set:  [450 mL] 450 mL PEEP:  [5 cmH20] 5 cmH20 Pressure Support:  [8 cmH20] 8 cmH20 Plateau Pressure:  [15 cmH20-23 cmH20] 15 cmH20   Intake/Output Summary (Last 24 hours) at 04/16/2019 0710 Last data filed at 04/16/2019 4098 Gross per 24 hour  Intake 2396.98 ml  Output 2850 ml  Net -453.02 ml   Filed Weights   04/11/19 0600 04/12/19 0500 04/13/19 0600  Weight: 73.6 kg 75.6 kg 74.9 kg   Physical Exam: General: NAD, ill appearing HENT: [place], AT, ETT in place Eyes: eom intact, no icterus or injection Respiratory:  CTAB, no w/r/r  Cardiovascular: RRR, -M/R/G, no JVD, warm extremities GI: soft, NTTP, non-distended Extremities: no LE edema, moving all extremities Neuro: alert, follows commands, nodding head to answer  Resolved Hospital Problem list   A. Fib with RVR  Assessment & Plan:  ARDS secondary to COVID-19: Was not a candidate for Tocilizumab due to sepsis on admission. Completed remdesivir 6/3. SBT this morning going well and should be stable for extubation.    Extubate to HFNC today Cont lasix 40 mg bid  Cont. Potassium w/diuersis  Titrating off solu-medrol, last day today Sedation for RASS goal 0 to -1 VAP prevention Cont. PT/OT Cont duonebs q6h  Hypokalemia - resolved  Atrial fibrillation with RVR: NSR with amio Tele Continue BB cont. lovenox at VTE dose and add full dose aspirin Will need echo at some point   Best practice:  Diet: continue tube feeding Pain/Anxiety/Delirium protocol (if indicated): yes, target RASS score 0 to -1 VAP protocol (if indicated): Yes DVT prophylaxis lovenox GI prophylaxis: pepcid 20 mg bid Glucose control: per TRH Mobility: PT/OT consulted Code Status: full Family Communication: Attempted to call daughter on 6/3. No VM set up.  Disposition: remain in ICU - extubate today  Labs   CBC: Recent Labs  Lab 04/10/19 0457  04/11/19 0540 04/12/19 0438 04/12/19 0530 04/13/19 0530 04/14/19 0220 04/15/19 0411  WBC 6.1  --  6.5  --  8.9 11.9* 10.0 10.0  NEUTROABS 3.9  --  5.7  --  8.1* 10.8*  --   --  HGB 14.4   < > 13.8 12.6* 13.0 14.0 14.5 15.4  HCT 46.2   < > 42.3 37.0* 41.1 43.3 45.0 47.8  MCV 84.5  --  83.8  --  84.2 83.9 83.0 84.2  PLT 119*  --  124*  --  149* 183 204 206   < > = values in this interval not displayed.    Basic Metabolic Panel: Recent Labs  Lab 04/11/19 0540 04/11/19 1710  04/12/19 0500 04/12/19 0530 04/12/19 2130 04/13/19 0530 04/14/19 0220 04/15/19 0411  NA 138  --    < >  --  137 143 144 141 145  K 3.9   --    < >  --  4.7 3.5 3.9 3.2* 3.5  CL 101  --   --   --  104 101 104 101 103  CO2 25  --   --   --  24 30 30 31 30   GLUCOSE 222*  --   --   --  208* 149* 175* 223* 192*  BUN 30*  --   --   --  34* 34* 34* 40* 45*  CREATININE 1.28*  --   --   --  0.96 0.99 1.07 1.03 1.08  CALCIUM 8.0*  --   --   --  7.9* 8.2* 8.5* 8.1* 8.7*  MG 2.3 2.5*  --  2.7*  --  2.5*  --   --   --   PHOS 3.8 3.1  --  2.6  --   --   --   --   --    < > = values in this interval not displayed.   GFR: Estimated Creatinine Clearance: 74.2 mL/min (by C-G formula based on SCr of 1.08 mg/dL). Recent Labs  Lab 04/10/19 1526  04/12/19 0530 04/13/19 0530 04/14/19 0220 04/15/19 0411  PROCALCITON 1.30  --   --   --   --   --   WBC  --    < > 8.9 11.9* 10.0 10.0   < > = values in this interval not displayed.    Liver Function Tests: Recent Labs  Lab 04/11/19 0540 04/12/19 0530 04/13/19 0530 04/14/19 0220 04/15/19 0411  AST 108* 67* 73* 55* 49*  ALT 90* 69* 76* 90* 85*  ALKPHOS 73 70 89 92 98  BILITOT 0.4 0.4 0.7 0.5 0.7  PROT 7.0 6.2* 6.7 6.7 7.2  ALBUMIN 2.6* 2.4* 2.5* 2.7* 2.8*   No results for input(s): LIPASE, AMYLASE in the last 168 hours. No results for input(s): AMMONIA in the last 168 hours.  ABG    Component Value Date/Time   PHART 7.395 04/12/2019 0438   PCO2ART 44.3 04/12/2019 0438   PO2ART 52.0 (L) 04/12/2019 0438   HCO3 27.3 04/12/2019 0438   TCO2 29 04/12/2019 0438   O2SAT 87.0 04/12/2019 0438     Coagulation Profile: No results for input(s): INR, PROTIME in the last 168 hours.  Cardiac Enzymes: No results for input(s): CKTOTAL, CKMB, CKMBINDEX, TROPONINI in the last 168 hours.  HbA1C: Hgb A1c MFr Bld  Date/Time Value Ref Range Status  04/15/2019 04:11 AM 6.2 (H) 4.8 - 5.6 % Final    Comment:    (NOTE) Pre diabetes:          5.7%-6.4% Diabetes:              >6.4% Glycemic control for   <7.0% adults with diabetes     CBG: Recent Labs  Lab 04/15/19 1153  04/15/19 1556  04/15/19 1921 04/15/19 2323 04/16/19 0400  GLUCAP 275* 285* 321* 265* 224*     Critical care time:     Versie StarksSeawell, Jonel Weldon A, DO 04/16/2019, 7:10 AM Pager: [phone]

## 2019-04-16 NOTE — Procedures (Signed)
Extubation Procedure Note  Patient Details:   Name: Y'Niang Pinkhasov DOB: 1971/02/23 MRN: [id number]   Airway Documentation:    Vent end date: 04/16/19 Vent end time: 1004   Evaluation  O2 sats: stable throughout Complications: No apparent complications Patient did tolerate procedure well. Bilateral Breath Sounds: Clear, Rhonchi   Yes   Pt extubated to Bipap (NIV PC 10/5 R 12 40% on servo) per MD request. Pt stable throughout with no complications. VS within normal limits. Pt had positive cuff leak prior to extubation and was able to speak and has strong productive cough post extubation. Pt encouraged to use Yankauer to clear secretions.  RT will closely monitor pt as he has failed extubation previously  Carolan Shiver 04/16/2019, 10:55 AM

## 2019-04-16 NOTE — Progress Notes (Signed)
Patient not tolerating Bipap well at this time. Spoke to Dr. Everardo All and agreed to change to Heated High Flow [place].  Patient placed on 40%, 25L, sats 93% and tolerating well.  RT will continue to monitor.

## 2019-04-17 ENCOUNTER — Inpatient Hospital Stay (HOSPITAL_COMMUNITY): Payer: Self-pay

## 2019-04-17 LAB — CBC
HCT: 53.4 % — ABNORMAL HIGH (ref 39.0–52.0)
Hemoglobin: 16.3 g/dL (ref 13.0–17.0)
MCH: 26.4 pg (ref 26.0–34.0)
MCHC: 30.5 g/dL (ref 30.0–36.0)
MCV: 86.4 fL (ref 80.0–100.0)
Platelets: 234 10*3/uL (ref 150–400)
RBC: 6.18 MIL/uL — ABNORMAL HIGH (ref 4.22–5.81)
RDW: 14.4 % (ref 11.5–15.5)
WBC: 8 10*3/uL (ref 4.0–10.5)
nRBC: 0 % (ref 0.0–0.2)

## 2019-04-17 LAB — GLUCOSE, CAPILLARY
Glucose-Capillary: 107 mg/dL — ABNORMAL HIGH (ref 70–99)
Glucose-Capillary: 90 mg/dL (ref 70–99)
Glucose-Capillary: 272 mg/dL — ABNORMAL HIGH (ref 70–99)
Glucose-Capillary: 156 mg/dL — ABNORMAL HIGH (ref 70–99)
Glucose-Capillary: 92 mg/dL (ref 70–99)
Glucose-Capillary: 81 mg/dL (ref 70–99)

## 2019-04-17 LAB — ECHOCARDIOGRAM COMPLETE
Height: 63 in
Weight: 2641.99 oz

## 2019-04-17 LAB — COMPREHENSIVE METABOLIC PANEL
ALT: 45 U/L — ABNORMAL HIGH (ref 0–44)
AST: 22 U/L (ref 15–41)
Albumin: 2.9 g/dL — ABNORMAL LOW (ref 3.5–5.0)
Alkaline Phosphatase: 75 U/L (ref 38–126)
Anion gap: 13 (ref 5–15)
BUN: 62 mg/dL — ABNORMAL HIGH (ref 6–20)
CO2: 31 mmol/L (ref 22–32)
Calcium: 9.3 mg/dL (ref 8.9–10.3)
Chloride: 106 mmol/L (ref 98–111)
Creatinine, Ser: 1.27 mg/dL — ABNORMAL HIGH (ref 0.61–1.24)
GFR calc Af Amer: >60 mL/min (ref >60)
GFR calc non Af Amer: >60 mL/min (ref >60)
Glucose, Bld: 102 mg/dL — ABNORMAL HIGH (ref 70–99)
Potassium: 4.3 mmol/L (ref 3.5–5.1)
Sodium: 150 mmol/L — ABNORMAL HIGH (ref 135–145)
Total Bilirubin: 1.1 mg/dL (ref 0.3–1.2)
Total Protein: 7.9 g/dL (ref 6.5–8.1)

## 2019-04-17 LAB — FERRITIN: Ferritin: 993 ng/mL — ABNORMAL HIGH (ref 24–336)

## 2019-04-17 LAB — D-DIMER, QUANTITATIVE: D-Dimer, Quant: 0.55 ug/mL-FEU — ABNORMAL HIGH (ref 0.00–0.50)

## 2019-04-17 LAB — C-REACTIVE PROTEIN: CRP: 17.8 mg/dL — ABNORMAL HIGH (ref <1.0)

## 2019-04-17 MED ORDER — LORAZEPAM 2 MG/ML IJ SOLN
0.5000 mg | Freq: Four times a day (QID) | INTRAMUSCULAR | Status: DC | PRN
  Administered 2019-04-17: 11:00:00 0.5 mg via INTRAVENOUS
  Filled 2019-04-17: qty 1

## 2019-04-17 MED ORDER — INSULIN GLARGINE 100 UNIT/ML ~~LOC~~ SOLN
12.0000 [IU] | Freq: Every day | SUBCUTANEOUS | Status: DC
  Administered 2019-04-17: 21:00:00 12 [IU] via SUBCUTANEOUS
  Filled 2019-04-17: qty 0.12

## 2019-04-17 MED ORDER — LORAZEPAM 0.5 MG PO TABS
0.5000 mg | ORAL_TABLET | Freq: Four times a day (QID) | ORAL | Status: DC | PRN
  Administered 2019-04-23 (×2): 0.5 mg via ORAL
  Filled 2019-04-17 (×2): qty 1

## 2019-04-17 MED ORDER — METOPROLOL TARTRATE 25 MG PO TABS
12.5000 mg | ORAL_TABLET | Freq: Two times a day (BID) | ORAL | Status: DC
  Administered 2019-04-17 – 2019-04-30 (×26): 12.5 mg via ORAL
  Filled 2019-04-17 (×26): qty 1

## 2019-04-17 MED ORDER — TRAZODONE HCL 50 MG PO TABS
50.0000 mg | ORAL_TABLET | Freq: Every evening | ORAL | Status: DC | PRN
  Administered 2019-04-17 – 2019-04-29 (×4): 50 mg via ORAL
  Filled 2019-04-17 (×4): qty 1

## 2019-04-17 NOTE — Evaluation (Signed)
Clinical/Bedside Swallow Evaluation Patient Details  Name: Alakai Macbride MRN: [id number] Date of Birth: 12/08/1970  Today's Date: 04/17/2019 Time: SLP Start Time (ACUTE ONLY): 1140 SLP Stop Time (ACUTE ONLY): 1148 SLP Time Calculation (min) (ACUTE ONLY): 8 min  Past Medical History:  Past Medical History:  Diagnosis Date  . COVID-19 virus infection 04/08/2019   Past Surgical History: History reviewed. No pertinent surgical history. HPI:   48 year old male with acute respiratory failure secondary to COVID-19 pneumonia requiring intubation on 5/30.  He was extubated on 6/1 however developed AFRVR associated with hypoxemia requiring reintubation on the same day, until 6/5, 6 days total.    Assessment / Plan / Recommendation Clinical Impression  Pt demonstrates normal swallow function with regular solids and thin liquids. No outward signs of dysphagia or aspiration; has been tolerating thin liquids for over 12 hours. Will sign off.  SLP Visit Diagnosis: Dysphagia, oropharyngeal phase (R13.12)    Aspiration Risk  Mild aspiration risk    Diet Recommendation Regular;Thin liquid   Liquid Administration via: Cup;Straw Medication Administration: Whole meds with liquid Supervision: Patient able to self feed    Other  Recommendations     Follow up Recommendations None      Frequency and Duration            Prognosis        Swallow Study   General HPI:  48 year old male with acute respiratory failure secondary to COVID-19 pneumonia requiring intubation on 5/30.  He was extubated on 6/1 however developed AFRVR associated with hypoxemia requiring reintubation on the same day, until 6/5, 6 days total.  Type of Study: Bedside Swallow Evaluation Previous Swallow Assessment: none Diet Prior to this Study: Thin liquids Temperature Spikes Noted: No Respiratory Status: Nasal cannula History of Recent Intubation: Yes Length of Intubations (days): 6 days Date extubated:  04/16/19 Behavior/Cognition: Alert;Cooperative;Pleasant mood Oral Cavity Assessment: Within Functional Limits Oral Care Completed by SLP: No Oral Cavity - Dentition: Adequate natural dentition Vision: Functional for self-feeding Self-Feeding Abilities: Able to feed self Patient Positioning: Upright in bed Baseline Vocal Quality: Normal Volitional Cough: Strong Volitional Swallow: Able to elicit    Oral/Motor/Sensory Function Overall Oral Motor/Sensory Function: Within functional limits   Ice Chips     Thin Liquid Thin Liquid: Within functional limits Presentation: Cup;Straw;Self Fed    Nectar Thick Nectar Thick Liquid: Not tested   Honey Thick Honey Thick Liquid: Not tested   Puree Puree: Within functional limits   Solid     Solid: Within functional limits     Herbie Baltimore, MA [place] Pager [phone] Office [phone]  Lynann Beaver 04/17/2019,12:02 PM

## 2019-04-17 NOTE — Progress Notes (Signed)
Spoke with patient's niece, Hoy Register and gave her updates on patient. Told her patient was on clear liquid diet and doing well since he has been off of the vent. Bren was able to face time with patient and ask him questions. She appreciated all of the care we are giving her uncle and will call if she has further questions. She will relay all information regarding patient updates to patient's family.

## 2019-04-17 NOTE — Progress Notes (Signed)
Patient very restless all night. Keeps stating he is tired, but when asked if he was ready to go to sleep he stated no. PRN trazodone ordered and given and PRN morphine given. Pt did have one period of SOB and resolved with inhaler. Patient still has not been to sleep.   Margaret Pyle, RN

## 2019-04-17 NOTE — Progress Notes (Signed)
PROGRESS NOTE    Melvin Mills  ZOX:[id number] DOB: 04/03/1971 DOA: 04/08/2019 PCP: Patient, No Pcp Per      Brief Narrative:  [name] is a 48 y.o. M with no significant PMHx who presented with 1 week fever, SOB, productive cough.    In the ER, CXR showed bilateral infiltrates.  SARS-CoV-2 NAA positive.  Admitted to floor.  Progressively hypoxic 5/30 requiring intubation.  Extubated 6/1, developed respiratory distress and new onset Afib and was reintubated.       Assessment & Plan:  Coronavirus pneumonitis with acute hypoxic respiratory failure In setting of ongoing 2020 COVID-19 pandemic.  S/p convalescent plasma 6/1.   S/p Azithro and ceftriaxone x5d. S/p remdesivir 5d 5/30 to 6/3  CRP down slightly, dimer and ferritin stable. Weaned to 12L [place] today. Tolerating clears today. -Continue Lovenox, ICU dosing, for VTE PPx  -Continue close monitoring I/Os, daily Cr, K  -Continue daily d-dimer, ferritin and CRP   COVID-19 Labs Recent Labs    04/15/19 0411 04/16/19 0550 04/17/19 0259  DDIMER 1.10* 0.82* 0.55*  FERRITIN 794* 924* 993*  CRP 5.3* 19.9* 17.8*       New onset atrial fibrillation This developed in the intensive care unit after extubation the first time.  This lasted less than 24 hours.  No previous history.  CHA2DS2-Vasc 0.    Remains in stable sinus rhythm.  TSH low, T4 normal --> euthyroid sick.   Repeat TSH in 3 months. -Continue full dose aspirin -Continue metoprolol -Obtain Echo when able  Hypokalemia K normal  Hyperglycemia Glucose better. A1c shows 6.2%, pre-diabetes only. -Continue Lantus, reduce dose -Continue SSI corrections   Transaminitis No change, from COVID.  Urinary retention -Start Flomax -D/c foley      MDM and disposition: The below labs and imaging reports reviewed and summarized above.  Medication management as above.  The patient was admitted with acute hypoxic respiratory failure form COVID-19.   He has developed hyperglycemia requiring frequent adjustment of insulin as above, transaminitis from COVID, and new onset atrial fibrillation.  Anticoagulation is managed.  We will continue to wean down oxygen, transferred downstairs within 24 hours.        Ventilator best practice:  Diet: Soft VAP protocol (if indicated): N/A GI prophylaxis: N/A Glucose control: Insulin Mobility: PT  DVT prophylaxis: Lovenox Code Status: FULL     Consultants:   PCCM  Procedures:   5/30 ETT  6/1 extubation  6/1 ETT x2 (reintubation)  6/5 extubation  Antimicrobials:   Azithromycin 5/29>> 6/1  Ceftriaxone 5/28>> 6/1  Remdesivir 5/30 >> 6/3  Culture data:   5/28 blood culture x1 -- NG  5/30 respiratory culture --NG  MRSA nares negative      Subjective: Afebrile.  Able to wean down oxygen.  Complains of sore throat.  Somewhat anxious.  Extubated yesterday.   Objective: Vitals:   04/17/19 0500 04/17/19 0600 04/17/19 0700 04/17/19 0744  BP: 106/90 108/86 104/89 104/89  Pulse: 81 81 86   Resp: (!) 23 (!) 26 (!) 27   Temp:    98.2 F (36.8 C)  TempSrc:    Oral  SpO2: 92% 90% 93%   Weight:      Height:        Intake/Output Summary (Last 24 hours) at 04/17/2019 0857 Last data filed at 04/17/2019 0600 Gross per 24 hour  Intake 2045.43 ml  Output 2835 ml  Net -789.57 ml   Filed Weights   04/11/19 0600 04/12/19 0500  04/13/19 0600  Weight: 73.6 kg 75.6 kg 74.9 kg    Examination:  General appearance: Thin adult male, lying in bed, no acute distress.  Interactive. HEENT: Anicteric, conjunctival pink, lids and lashes normal, no nasal deformity, discharge, or epistaxis.  Lips normal, ET tube in place.  Hearing normal.    Skin: Warm and dry, no suspicious rashes or lesions. Cardiac: Tachycardic, regular, JVP normal, no lower extremity edema. Respiratory: Respirations shallow, lung sounds diminished, no respiratory distress, no wheezing. Abdomen: Abdomen soft  without distention, hepatosplenomegaly, grimace to palpation. Neuro/Psych: Oriented to person, place, and time, makes eye contact, speech fluent     Data Reviewed: I have personally reviewed following labs and imaging studies:  CBC: Recent Labs  Lab 04/11/19 0540  04/12/19 0530 04/13/19 0530 04/14/19 0220 04/15/19 0411 04/16/19 0550 04/17/19 0259  WBC 6.5  --  8.9 11.9* 10.0 10.0 9.2 8.0  NEUTROABS 5.7  --  8.1* 10.8*  --   --   --   --   HGB 13.8   < > 13.0 14.0 14.5 15.4 15.3 16.3  HCT 42.3   < > 41.1 43.3 45.0 47.8 48.8 53.4*  MCV 83.8  --  84.2 83.9 83.0 84.2 85.6 86.4  PLT 124*  --  149* 183 204 206 207 234   < > = values in this interval not displayed.   Basic Metabolic Panel: Recent Labs  Lab 04/11/19 0540 04/11/19 1710  04/12/19 0500  04/12/19 2130 04/13/19 0530 04/14/19 0220 04/15/19 0411 04/16/19 0550 04/17/19 0259  NA 138  --    < >  --    < > 143 144 141 145 145 150*  K 3.9  --    < >  --    < > 3.5 3.9 3.2* 3.5 4.2 4.3  CL 101  --   --   --    < > 101 104 101 103 104 106  CO2 25  --   --   --    < > 32 31  GLUCOSE 222*  --   --   --    < > 149* 175* 223* 192* 241* 102*  BUN 30*  --   --   --    < > 34* 34* 40* 45* 49* 62*  CREATININE 1.28*  --   --   --    < > 0.99 1.07 1.03 1.08 1.09 1.27*  CALCIUM 8.0*  --   --   --    < > 8.2* 8.5* 8.1* 8.7* 8.8* 9.3  MG 2.3 2.5*  --  2.7*  --  2.5*  --   --   --  2.7*  --   PHOS 3.8 3.1  --  2.6  --   --   --   --   --   --   --    < > = values in this interval not displayed.   GFR: Estimated Creatinine Clearance: 63.1 mL/min (A) (by C-G formula based on SCr of 1.27 mg/dL (H)). Liver Function Tests: Recent Labs  Lab 04/13/19 0530 04/14/19 0220 04/15/19 0411 04/16/19 0550 04/17/19 0259  AST 73* 55* 49* 21 22  ALT 76* 90* 85* 56* 45*  ALKPHOS 89 92 98 97 75  BILITOT 0.7 0.5 0.7 1.0 1.1  PROT 6.7 6.7 7.2 7.4 7.9  ALBUMIN 2.5* 2.7* 2.8* 2.8* 2.9*   No results for input(s): LIPASE, AMYLASE in  the last 168 hours. No  results for input(s): AMMONIA in the last 168 hours. Coagulation Profile: No results for input(s): INR, PROTIME in the last 168 hours. Cardiac Enzymes: No results for input(s): CKTOTAL, CKMB, CKMBINDEX, TROPONINI in the last 168 hours. BNP (last 3 results) No results for input(s): PROBNP in the last 8760 hours. HbA1C: Recent Labs    04/15/19 0411  HGBA1C 6.2*   CBG: Recent Labs  Lab 04/16/19 1659 04/16/19 1932 04/16/19 2316 04/17/19 0334 04/17/19 0753  GLUCAP 189* 150* 112* 107* 90   Lipid Profile: No results for input(s): CHOL, HDL, LDLCALC, TRIG, CHOLHDL, LDLDIRECT in the last 72 hours. Thyroid Function Tests: Recent Labs    04/15/19 0411  TSH 0.253*  FREET4 1.71   Anemia Panel: Recent Labs    04/16/19 0550 04/17/19 0259  FERRITIN 924* 993*   Urine analysis: No results found for: COLORURINE, APPEARANCEUR, LABSPEC, PHURINE, GLUCOSEU, HGBUR, BILIRUBINUR, KETONESUR, PROTEINUR, UROBILINOGEN, NITRITE, LEUKOCYTESUR Sepsis Labs: @LABRCNTIP (procalcitonin:4,lacticacidven:4)  ) Recent Results (from the past 240 hour(s))  SARS Coronavirus 2 (CEPHEID- Performed in [facility] lab), Hosp Order     Status: Abnormal   Collection Time: 04/08/19 10:23 AM  Result Value Ref Range Status   SARS Coronavirus 2 POSITIVE (A) NEGATIVE Final    Comment: RESULT CALLED TO, READ BACK BY AND VERIFIED WITH: Duanne Guess. Marshall RN 12:10 04/08/19 ([email or web address]) (NOTE) If result is NEGATIVE SARS-CoV-2 target nucleic acids are NOT DETECTED. The SARS-CoV-2 RNA is generally detectable in upper and lower  respiratory specimens during the acute phase of infection. The lowest  concentration of SARS-CoV-2 viral copies this assay can detect is 250  copies / mL. A negative result does not preclude SARS-CoV-2 infection  and should not be used as the sole basis for treatment or other  patient management decisions.  A negative result may occur with  improper specimen collection /  handling, submission of specimen other  than nasopharyngeal swab, presence of viral mutation(s) within the  areas targeted by this assay, and inadequate number of viral copies  (<250 copies / mL). A negative result must be combined with clinical  observations, patient history, and epidemiological information. If result is POSITIVE SARS-CoV-2 target nucleic acids are DETECTED.  The SARS-CoV-2 RNA is generally detectable in upper and lower  respiratory specimens during the acute phase of infection.  Positive  results are indicative of active infection with SARS-CoV-2.  Clinical  correlation with patient history and other diagnostic information is  necessary to determine patient infection status.  Positive results do  not rule out bacterial infection or co-infection with other viruses. If result is PRESUMPTIVE POSTIVE SARS-CoV-2 nucleic acids MAY BE PRESENT.   A presumptive positive result was obtained on the submitted specimen  and confirmed on repeat testing.  While 2019 novel coronavirus  (SARS-CoV-2) nucleic acids may be present in the submitted sample  additional confirmatory testing may be necessary for epidemiological  and / or clinical management purposes  to differentiate between  SARS-CoV-2 and other Sarbecovirus currently known to infect humans.  If clinically indicated additional testing with an alternate test  methodology [phone]([id number])  is advised. The SARS-CoV-2 RNA is generally  detectable in upper and lower respiratory specimens during the acute  phase of infection. The expected result is Negative. Fact Sheet for Patients:  BoilerBrush.com.cyhttps://www.fda.gov/media/136312/download Fact Sheet for Healthcare Providers: https://pope.com/https://www.fda.gov/media/136313/download This test is not yet approved or cleared by the [place] FDA and has been authorized for detection and/or diagnosis of SARS-CoV-2 by FDA under an Emergency Use Authorization (EUA).  This EUA will remain in effect (meaning this  test can be used) for the duration of the COVID-19 declaration under Section 564(b)(1) of the Act, 21 U.S.C. section 360bbb-3(b)(1), unless the authorization is terminated or revoked sooner. Performed at [facility], [place] [address], [place], [place] [address]   Blood culture (routine x 2)     Status: None   Collection Time: 04/08/19 11:15 AM  Result Value Ref Range Status   Specimen Description BLOOD RIGHT ANTECUBITAL  Final   Special Requests   Final    BOTTLES DRAWN AEROBIC AND ANAEROBIC Blood Culture adequate volume   Culture   Final    NO GROWTH 5 DAYS Performed at [facility], [place] [address], [place], [place] [address]    Report Status 04/13/2019 FINAL  Final  Expectorated sputum assessment w rflx to resp cult     Status: None   Collection Time: 04/10/19  3:07 PM  Result Value Ref Range Status   Specimen Description SPUTUM  Final   Special Requests NONE  Final   Sputum evaluation   Final    THIS SPECIMEN IS ACCEPTABLE FOR SPUTUM CULTURE Performed at [facility], [place] [address], [place], [place] [address]    Report Status 04/11/2019 FINAL  Final  Culture, respiratory     Status: None   Collection Time: 04/10/19  3:07 PM  Result Value Ref Range Status   Specimen Description   Final    SPUTUM Performed at [place] [address], [place], [place] [address]    Special Requests   Final    NONE Reflexed from [id number] Performed at [facility], [place] [address], [place], [place] [address]    Gram Stain   Final    MODERATE WBC PRESENT, PREDOMINANTLY PMN NO ORGANISMS SEEN    Culture   Final    NO GROWTH 2 DAYS Performed at [place] [address], [place], [place] [address]    Report Status 04/13/2019 FINAL  Final  MRSA PCR Screening     Status: None   Collection Time: 04/13/19  1:26 PM  Result Value Ref Range Status   MRSA by PCR NEGATIVE NEGATIVE Final    Comment:        The  GeneXpert MRSA Assay (FDA approved for NASAL specimens only), is one component of a comprehensive MRSA colonization surveillance program. It is not intended to diagnose MRSA infection nor to guide or monitor treatment for MRSA infections. Performed at [facility], [place] [address] [place],  [address]          Radiology Studies: No results found.      Scheduled Meds: . aspirin  325 mg Oral Daily  . Chlorhexidine Gluconate Cloth  6 each Topical Daily  . dextromethorphan  30 mg Per Tube BID   And  . guaiFENesin  15 mL Per Tube Q6H  . enoxaparin (LOVENOX) injection  40 mg Subcutaneous Q12H  . insulin aspart  0-9 Units Subcutaneous Q4H  . insulin aspart  3 Units Subcutaneous Q4H  . insulin glargine  12 Units Subcutaneous QHS  . Ipratropium-Albuterol  1 puff Inhalation Q6H WA  . mouth rinse  15 mL Mouth Rinse BID  . metoprolol tartrate  5 mg Intravenous Q12H  . multivitamin  15 mL Per Tube Daily  . sodium chloride flush  3 mL Intravenous Q12H   Continuous Infusions: . sodium chloride Stopped (04/16/19 2015)  LOS: 9 days    Time spent: 35 minutes  During this encounter: Patient Isolation: Airborne, contact, droplet HCP PPE: Face shield, head covering, N95, gown, gloves, and shoe covers    Alberteen Samhristopher P Terease Marcotte, MD [facility] 04/17/2019, 8:57 AM     Please page through AMION:  [email or web address] Password [email or web address] If 7PM-7AM, please contact night-coverage

## 2019-04-17 NOTE — Progress Notes (Signed)
NAME:  Melvin Mills, MRN:  [id number], DOB:  04/08/1971, LOS: 9 ADMISSION DATE:  04/08/2019, CONSULTATION DATE: Apr 10, 2019 REFERRING MD: Dr. Jarvis NewcomerGrunz, CHIEF COMPLAINT: Dyspnea  Brief History   48 year old male with no past medical history admitted on Apr 08, 2019 with COVID pneumonia leading to ARDS.  He required intubation and mechanical ventilation on Apr 10, 2019.  Extubated on June 1 but rapidly developed atrial fibrillation with RVR, reintubated.  Extubated again successfully on April 16, 2019.  Past Medical History  No known PMH  Significant Hospital Events   5/28 admitted 5/30 transferred to ICU and intubated 6/1 Extubated, developed atrial fibrillation with RVR, re-intubated 6/5 Extubated  Consults:  Pulmonary and critical care medicine  Procedures:  5/30 > Intubated 5/31 > Extubated and reintubated  Significant Diagnostic Tests:    Micro Data:  May 28 SARS-CoV-2 positive May 28 blood culture no growth to date  Antimicrobials/COVID treatment  May 28 ceftriaxone > 6/1 May 29 azithromycin > 6/1 May 30 remdesivir > 6/3 June 1 convalescent plasma  Interim history/subjective:  Remains extubated 10L high flow overnight Wants to eat and drink more  Objective   Blood pressure 104/89, pulse 86, temperature 98.2 F (36.8 C), temperature source Oral, resp. rate (!) 27, height 5\' 3"  (1.6 m), weight 74.9 kg, SpO2 93 %.        Intake/Output Summary (Last 24 hours) at 04/17/2019 1157 Last data filed at 04/17/2019 0600 Gross per 24 hour  Intake 1378.21 ml  Output 2560 ml  Net -1181.79 ml   Filed Weights   04/11/19 0600 04/12/19 0500 04/13/19 0600  Weight: 73.6 kg 75.6 kg 74.9 kg    Examination:  General:  Resting comfortably in bed HENT: NCAT OP clear PULM: Crackles base B, normal effort CV: RRR, no mgr GI: BS+, soft, nontender MSK: normal bulk and tone Neuro: awake, alert, no distress, MAEW   Resolved Hospital Problem list     Assessment & Plan:   ARDS due to COVID-19 pneumonia Doing well postextubation Continue to administer O2 to maintain O2 saturation greater than 88% Intubation decision should be made more on mental status, signs of ventilatory failure such as nasal flaring accessory muscle use rather than oxygenation Monitor closely in ICU today Mobilize, out of bed as able, PT consult  Diet: Advance  Best practice:  Diet: advance Pain/Anxiety/Delirium protocol (if indicated): n/a VAP protocol (if indicated): n/a DVT prophylaxis: lovenox per COVID protocol GI prophylaxis: n/a Glucose control: monitor Mobility: bed rest Code Status: full Family Communication: updated his niece by phone today Disposition:   Labs   CBC: Recent Labs  Lab 04/11/19 0540  04/12/19 0530 04/13/19 0530 04/14/19 0220 04/15/19 0411 04/16/19 0550 04/17/19 0259  WBC 6.5  --  8.9 11.9* 10.0 10.0 9.2 8.0  NEUTROABS 5.7  --  8.1* 10.8*  --   --   --   --   HGB 13.8   < > 13.0 14.0 14.5 15.4 15.3 16.3  HCT 42.3   < > 41.1 43.3 45.0 47.8 48.8 53.4*  MCV 83.8  --  84.2 83.9 83.0 84.2 85.6 86.4  PLT 124*  --  149* 183 204 206 207 234   < > = values in this interval not displayed.    Basic Metabolic Panel: Recent Labs  Lab 04/11/19 0540 04/11/19 1710  04/12/19 0500  04/12/19 2130 04/13/19 0530 04/14/19 0220 04/15/19 0411 04/16/19 0550 04/17/19 0259  NA 138  --    < >  --    < >  143 144 141 145 145 150*  K 3.9  --    < >  --    < > 3.5 3.9 3.2* 3.5 4.2 4.3  CL 101  --   --   --    < > 101 104 101 103 104 106  CO2 25  --   --   --    < > 30 30 31 30  32 31  GLUCOSE 222*  --   --   --    < > 149* 175* 223* 192* 241* 102*  BUN 30*  --   --   --    < > 34* 34* 40* 45* 49* 62*  CREATININE 1.28*  --   --   --    < > 0.99 1.07 1.03 1.08 1.09 1.27*  CALCIUM 8.0*  --   --   --    < > 8.2* 8.5* 8.1* 8.7* 8.8* 9.3  MG 2.3 2.5*  --  2.7*  --  2.5*  --   --   --  2.7*  --   PHOS 3.8 3.1  --  2.6  --   --   --   --   --   --   --    < > =  values in this interval not displayed.   GFR: Estimated Creatinine Clearance: 63.1 mL/min (A) (by C-G formula based on SCr of 1.27 mg/dL (H)). Recent Labs  Lab 04/10/19 1526  04/14/19 0220 04/15/19 0411 04/16/19 0550 04/17/19 0259  PROCALCITON 1.30  --   --   --   --   --   WBC  --    < > 10.0 10.0 9.2 8.0   < > = values in this interval not displayed.    Liver Function Tests: Recent Labs  Lab 04/13/19 0530 04/14/19 0220 04/15/19 0411 04/16/19 0550 04/17/19 0259  AST 73* 55* 49* 21 22  ALT 76* 90* 85* 56* 45*  ALKPHOS 89 92 98 97 75  BILITOT 0.7 0.5 0.7 1.0 1.1  PROT 6.7 6.7 7.2 7.4 7.9  ALBUMIN 2.5* 2.7* 2.8* 2.8* 2.9*   No results for input(s): LIPASE, AMYLASE in the last 168 hours. No results for input(s): AMMONIA in the last 168 hours.  ABG    Component Value Date/Time   PHART 7.395 04/12/2019 0438   PCO2ART 44.3 04/12/2019 0438   PO2ART 52.0 (L) 04/12/2019 0438   HCO3 27.3 04/12/2019 0438   TCO2 29 04/12/2019 0438   O2SAT 87.0 04/12/2019 0438     Coagulation Profile: No results for input(s): INR, PROTIME in the last 168 hours.  Cardiac Enzymes: No results for input(s): CKTOTAL, CKMB, CKMBINDEX, TROPONINI in the last 168 hours.  HbA1C: Hgb A1c MFr Bld  Date/Time Value Ref Range Status  04/15/2019 04:11 AM 6.2 (H) 4.8 - 5.6 % Final    Comment:    (NOTE) Pre diabetes:          5.7%-6.4% Diabetes:              >6.4% Glycemic control for   <7.0% adults with diabetes     CBG: Recent Labs  Lab 04/16/19 1659 04/16/19 1932 04/16/19 2316 04/17/19 0334 04/17/19 0753  GLUCAP 189* 150* 112* 107* 90     Critical care time: 35 minutes       Roselie Awkward, MD [place] Pager: [phone] Cell: [phone] If no response, call [phone]

## 2019-04-18 LAB — COMPREHENSIVE METABOLIC PANEL
ALT: 32 U/L (ref 0–44)
AST: 27 U/L (ref 15–41)
Albumin: 2.4 g/dL — ABNORMAL LOW (ref 3.5–5.0)
Alkaline Phosphatase: 59 U/L (ref 38–126)
Anion gap: 11 (ref 5–15)
BUN: 44 mg/dL — ABNORMAL HIGH (ref 6–20)
CO2: 30 mmol/L (ref 22–32)
Calcium: 8.7 mg/dL — ABNORMAL LOW (ref 8.9–10.3)
Chloride: 99 mmol/L (ref 98–111)
Creatinine, Ser: 1.02 mg/dL (ref 0.61–1.24)
GFR calc Af Amer: >60 mL/min (ref >60)
GFR calc non Af Amer: >60 mL/min (ref >60)
Glucose, Bld: 68 mg/dL — ABNORMAL LOW (ref 70–99)
Potassium: 3.9 mmol/L (ref 3.5–5.1)
Sodium: 140 mmol/L (ref 135–145)
Total Bilirubin: 0.9 mg/dL (ref 0.3–1.2)
Total Protein: 6.7 g/dL (ref 6.5–8.1)

## 2019-04-18 LAB — CBC
HCT: 44.5 % (ref 39.0–52.0)
Hemoglobin: 14.2 g/dL (ref 13.0–17.0)
MCH: 27 pg (ref 26.0–34.0)
MCHC: 31.9 g/dL (ref 30.0–36.0)
MCV: 84.8 fL (ref 80.0–100.0)
Platelets: 216 10*3/uL (ref 150–400)
RBC: 5.25 MIL/uL (ref 4.22–5.81)
RDW: 13.8 % (ref 11.5–15.5)
WBC: 5.3 10*3/uL (ref 4.0–10.5)
nRBC: 0 % (ref 0.0–0.2)

## 2019-04-18 LAB — C-REACTIVE PROTEIN: CRP: 9.1 mg/dL — ABNORMAL HIGH (ref <1.0)

## 2019-04-18 LAB — GLUCOSE, CAPILLARY
Glucose-Capillary: 92 mg/dL (ref 70–99)
Glucose-Capillary: 148 mg/dL — ABNORMAL HIGH (ref 70–99)
Glucose-Capillary: 85 mg/dL (ref 70–99)
Glucose-Capillary: 164 mg/dL — ABNORMAL HIGH (ref 70–99)
Glucose-Capillary: 84 mg/dL (ref 70–99)

## 2019-04-18 LAB — D-DIMER, QUANTITATIVE: D-Dimer, Quant: 1.32 ug/mL-FEU — ABNORMAL HIGH (ref 0.00–0.50)

## 2019-04-18 LAB — FERRITIN: Ferritin: 758 ng/mL — ABNORMAL HIGH (ref 24–336)

## 2019-04-18 MED ORDER — TAMSULOSIN HCL 0.4 MG PO CAPS
0.4000 mg | ORAL_CAPSULE | Freq: Every day | ORAL | Status: DC
  Administered 2019-04-18 – 2019-04-30 (×13): 0.4 mg via ORAL
  Filled 2019-04-18 (×13): qty 1

## 2019-04-18 NOTE — Progress Notes (Signed)
NAME:  Melvin Mills, MRN:  [id number], DOB:  06/30/1971, LOS: 10 ADMISSION DATE:  04/08/2019, CONSULTATION DATE: Apr 10, 2019 REFERRING MD: Dr. Jarvis NewcomerGrunz, CHIEF COMPLAINT: Dyspnea  Brief History   48 year old male with no past medical history admitted on Apr 08, 2019 with COVID pneumonia leading to ARDS.  He required intubation and mechanical ventilation on Apr 10, 2019.  Extubated on June 1 but rapidly developed atrial fibrillation with RVR, reintubated.  Extubated again successfully on April 16, 2019.  Past Medical History  No known PMH  Significant Hospital Events   5/28 admitted 5/30 transferred to ICU and intubated 6/1 Extubated, developed atrial fibrillation with RVR, re-intubated 6/5 Extubated  Consults:  Pulmonary and critical care medicine  Procedures:  5/30 > Intubated 5/31 > Extubated and reintubated  Significant Diagnostic Tests:    Micro Data:  May 28 SARS-CoV-2 positive May 28 blood culture no growth to date  Antimicrobials/COVID treatment  May 28 ceftriaxone > 6/1 May 29 azithromycin > 6/1 May 30 remdesivir > 6/3 June 1 convalescent plasma  Interim history/subjective:   Oxygenation improving Says he is tired Complains of feeling thirsty, wants to eat more  Objective   Blood pressure 104/79, pulse 89, temperature 98.6 F (37 C), temperature source Axillary, resp. rate (!) 35, height 5\' 3"  (1.6 m), weight 74.9 kg, SpO2 92 %.    FiO2 (%):  [92 %] 92 %   Intake/Output Summary (Last 24 hours) at 04/18/2019 0756 Last data filed at 04/18/2019 0600 Gross per 24 hour  Intake 2530 ml  Output 2455 ml  Net 75 ml   Filed Weights   04/11/19 0600 04/12/19 0500 04/13/19 0600  Weight: 73.6 kg 75.6 kg 74.9 kg    Examination:  General:  Resting comfortably in bed HENT: NCAT OP clear PULM: CTA B, normal effort CV: RRR, no mgr GI: BS+, soft, nontender MSK: normal bulk and tone Neuro: awake, alert, no distress, MAEW    Resolved Hospital Problem list      Assessment & Plan:  ARDS due to COVID-19 pneumonia: Oxygenation significantly improved Continue to administer oxygen to maintain O2 saturation greater than 85% Moved to progressive care unit Mobilize, out of bed as able, physical therapy consult Tolerate hypoxemia with movement, pause exercise as needed for increased shortness of breath  Diet: Advance diet  PCCM available prn  Best practice:  Diet: advance Pain/Anxiety/Delirium protocol (if indicated): n/a VAP protocol (if indicated): n/a DVT prophylaxis: lovenox per COVID protocol GI prophylaxis: n/a Glucose control: monitor Mobility: bed rest Code Status: full Family Communication: updated his niece by phone again 6/7 Disposition: to progressive care  Labs   CBC: Recent Labs  Lab 04/12/19 0530 04/13/19 0530 04/14/19 0220 04/15/19 0411 04/16/19 0550 04/17/19 0259 04/18/19 0524  WBC 8.9 11.9* 10.0 10.0 9.2 8.0 5.3  NEUTROABS 8.1* 10.8*  --   --   --   --   --   HGB 13.0 14.0 14.5 15.4 15.3 16.3 14.2  HCT 41.1 43.3 45.0 47.8 48.8 53.4* 44.5  MCV 84.2 83.9 83.0 84.2 85.6 86.4 84.8  PLT 149* 183 204 206 207 234 216    Basic Metabolic Panel: Recent Labs  Lab 04/11/19 1710  04/12/19 0500  04/12/19 2130 04/13/19 0530 04/14/19 0220 04/15/19 0411 04/16/19 0550 04/17/19 0259  NA  --    < >  --    < > 143 144 141 145 145 150*  K  --    < >  --    < >  3.5 3.9 3.2* 3.5 4.2 4.3  CL  --   --   --    < > 101 104 101 103 104 106  CO2  --   --   --    < > 30 30 31 30  32 31  GLUCOSE  --   --   --    < > 149* 175* 223* 192* 241* 102*  BUN  --   --   --    < > 34* 34* 40* 45* 49* 62*  CREATININE  --   --   --    < > 0.99 1.07 1.03 1.08 1.09 1.27*  CALCIUM  --   --   --    < > 8.2* 8.5* 8.1* 8.7* 8.8* 9.3  MG 2.5*  --  2.7*  --  2.5*  --   --   --  2.7*  --   PHOS 3.1  --  2.6  --   --   --   --   --   --   --    < > = values in this interval not displayed.   GFR: Estimated Creatinine Clearance: 63.1 mL/min (A) (by  C-G formula based on SCr of 1.27 mg/dL (H)). Recent Labs  Lab 04/15/19 0411 04/16/19 0550 04/17/19 0259 04/18/19 0524  WBC 10.0 9.2 8.0 5.3    Liver Function Tests: Recent Labs  Lab 04/13/19 0530 04/14/19 0220 04/15/19 0411 04/16/19 0550 04/17/19 0259  AST 73* 55* 49* 21 22  ALT 76* 90* 85* 56* 45*  ALKPHOS 89 92 98 97 75  BILITOT 0.7 0.5 0.7 1.0 1.1  PROT 6.7 6.7 7.2 7.4 7.9  ALBUMIN 2.5* 2.7* 2.8* 2.8* 2.9*   No results for input(s): LIPASE, AMYLASE in the last 168 hours. No results for input(s): AMMONIA in the last 168 hours.  ABG    Component Value Date/Time   PHART 7.395 04/12/2019 0438   PCO2ART 44.3 04/12/2019 0438   PO2ART 52.0 (L) 04/12/2019 0438   HCO3 27.3 04/12/2019 0438   TCO2 29 04/12/2019 0438   O2SAT 87.0 04/12/2019 0438     Coagulation Profile: No results for input(s): INR, PROTIME in the last 168 hours.  Cardiac Enzymes: No results for input(s): CKTOTAL, CKMB, CKMBINDEX, TROPONINI in the last 168 hours.  HbA1C: Hgb A1c MFr Bld  Date/Time Value Ref Range Status  04/15/2019 04:11 AM 6.2 (H) 4.8 - 5.6 % Final    Comment:    (NOTE) Pre diabetes:          5.7%-6.4% Diabetes:              >6.4% Glycemic control for   <7.0% adults with diabetes     CBG: Recent Labs  Lab 04/17/19 1214 04/17/19 1549 04/17/19 1952 04/17/19 2308 04/18/19 0334  GLUCAP 272* 156* 92 81 92     Critical care time: n/a       Roselie Awkward, MD [place] [facility] Pager: [phone] Cell: [phone] If no response, call [phone]

## 2019-04-18 NOTE — Progress Notes (Signed)
PROGRESS NOTE    Holly BodilyY'Niang Wahlstrom  BJY:[id number] DOB: 06/22/1971 DOA: 04/08/2019 PCP: Patient, No Pcp Per      Brief Narrative:  [name] is a 48 y.o. M with no significant PMHx who presented with 1 week fever, SOB, productive cough.    In the ER, CXR showed bilateral infiltrates.  SARS-CoV-2 NAA positive.  Admitted to floor.  Progressively hypoxic 5/30 requiring intubation.  Extubated 6/1, developed respiratory distress and new onset Afib and was reintubated.       Assessment & Plan:  Coronavirus pneumonitis with acute hypoxic respiratory failure In setting of ongoing 2020 COVID-19 pandemic.  S/p convalescent plasma 6/1.   S/p Azithro and ceftriaxone x5d. S/p remdesivir 5d 5/30 to 6/3  Inflammatory markers trending down. Continuing to wean oxygen overnight.  -Continue Lovenox -Continue supplemental oxygen, wean as able -PT eval   COVID-19 Labs Recent Labs    04/16/19 0550 04/17/19 0259 04/18/19 0524  DDIMER 0.82* 0.55* 1.32*  FERRITIN 924* 993* 758*  CRP 19.9* 17.8* 9.1*       New onset atrial fibrillation This developed in the intensive care unit after extubation the first time.  This lasted less than 24 hours.  No previous history.  CHA2DS2-Vasc 0.    Remains in stable sinus rhythm.  TSH low, T4 normal --> euthyroid sick.   Repeat TSH in 3 months. -Continue aspirin 325 -Continue metoprolol -Obtain Echo when able  Hypokalemia K normal  Hypernatremia  Resolved  Hyperglycemia Glucose low this morning. A1c shows 6.2%, pre-diabetes only. -Stop Lantus -Continue SSI corrections only as needed   Transaminitis Resolved  Urinary retention -Continue Flomax -D/c foley -BladderScan PRN      MDM and disposition: The below labs and imaging reports were reviewed and summarized above.  Medication management as above.  The patient was admitted with acute hypoxic respiratory failure form COVID-19.   We will continue to wean down oxygen, if  he is able to maneuver in room without difficulty, transferred downstairs today.        Ventilator best practice:  Diet: Diabetic VAP protocol (if indicated): N/A GI prophylaxis: N/A Glucose control: Insulin Mobility: PT  DVT prophylaxis: Lovenox Code Status: FULL     Consultants:   PCCM  Procedures:   5/30 ETT  6/1 extubation  6/1 ETT x2 (reintubation)  6/5 extubation  Antimicrobials:   Azithromycin 5/29>> 6/1  Ceftriaxone 5/28>> 6/1  Remdesivir 5/30 >> 6/3  Culture data:   5/28 blood culture x1 -- NG  5/30 respiratory culture --NG  MRSA nares negative      Subjective: Afebrile, mild sore throat, no chest pain, no dyspnea, no abdominal pain, no respiratory distress.  Feels "fatigued".  History collected through family member interpreter, per patient preference.   Objective: Vitals:   04/18/19 0600 04/18/19 0700 04/18/19 0742 04/18/19 0900  BP: 105/74 104/79 104/79   Pulse: 73 83 89   Resp: (!) 31 (!) 31 (!) 35   Temp:    98.1 F (36.7 C)  TempSrc:      SpO2: 97% 92% 92%   Weight:      Height:        Intake/Output Summary (Last 24 hours) at 04/18/2019 1059 Last data filed at 04/18/2019 0600 Gross per 24 hour  Intake 2050 ml  Output 2455 ml  Net -405 ml   Filed Weights   04/11/19 0600 04/12/19 0500 04/13/19 0600  Weight: 73.6 kg 75.6 kg 74.9 kg    Examination:  General  appearance: Adult male, lying in bed, no acute distress.  Interactive. HEENT: Anicteric, conjunctival pink, lids and lashes normal, no nasal deformity, discharge, or epistaxis.  Lips normal, ET tube in place.  Hearing normal.    Skin: Warm and dry, no suspicious rashes or lesions. Cardiac: Normal rate, regular, JVP normal, no lower extremity edema. Respiratory: Decreased respiratory effort, lung sounds diminished, no wheezing appreciated, no respiratory distress. Abdomen: Abdomen soft, no tenderness to palpation, no distention, hepatosplenomegaly, or grimace to  palpation. Neuro/Psych: Oriented to person, place, time, and situation.  Moves all extremities with global weakness but symmetric strength, speech fluent.     Data Reviewed: I have personally reviewed following labs and imaging studies:  CBC: Recent Labs  Lab 04/12/19 0530 04/13/19 0530 04/14/19 0220 04/15/19 0411 04/16/19 0550 04/17/19 0259 04/18/19 0524  WBC 8.9 11.9* 10.0 10.0 9.2 8.0 5.3  NEUTROABS 8.1* 10.8*  --   --   --   --   --   HGB 13.0 14.0 14.5 15.4 15.3 16.3 14.2  HCT 41.1 43.3 45.0 47.8 48.8 53.4* 44.5  MCV 84.2 83.9 83.0 84.2 85.6 86.4 84.8  PLT 149* 183 204 206 207 234 216   Basic Metabolic Panel: Recent Labs  Lab 04/11/19 1710  04/12/19 0500  04/12/19 2130  04/14/19 0220 04/15/19 0411 04/16/19 0550 04/17/19 0259 04/18/19 0524  NA  --    < >  --    < > 143   < > 141 145 145 150* 140  K  --    < >  --    < > 3.5   < > 3.2* 3.5 4.2 4.3 3.9  CL  --   --   --    < > 101   < > 101 103 104 106 99  CO2  --   --   --    < > 30   < > 31 30 32 31 30  GLUCOSE  --   --   --    < > 149*   < > 223* 192* 241* 102* 68*  BUN  --   --   --    < > 34*   < > 40* 45* 49* 62* 44*  CREATININE  --   --   --    < > 0.99   < > 1.03 1.08 1.09 1.27* 1.02  CALCIUM  --   --   --    < > 8.2*   < > 8.1* 8.7* 8.8* 9.3 8.7*  MG 2.5*  --  2.7*  --  2.5*  --   --   --  2.7*  --   --   PHOS 3.1  --  2.6  --   --   --   --   --   --   --   --    < > = values in this interval not displayed.   GFR: Estimated Creatinine Clearance: 78.6 mL/min (by C-G formula based on SCr of 1.02 mg/dL). Liver Function Tests: Recent Labs  Lab 04/14/19 0220 04/15/19 0411 04/16/19 0550 04/17/19 0259 04/18/19 0524  AST 55* 49* 21 22 27   ALT 90* 85* 56* 45* 32  ALKPHOS 92 98 97 75 59  BILITOT 0.5 0.7 1.0 1.1 0.9  PROT 6.7 7.2 7.4 7.9 6.7  ALBUMIN 2.7* 2.8* 2.8* 2.9* 2.4*   No results for input(s): LIPASE, AMYLASE in the last 168 hours. No results for input(s): AMMONIA in the last  168 hours.  Coagulation Profile: No results for input(s): INR, PROTIME in the last 168 hours. Cardiac Enzymes: No results for input(s): CKTOTAL, CKMB, CKMBINDEX, TROPONINI in the last 168 hours. BNP (last 3 results) No results for input(s): PROBNP in the last 8760 hours. HbA1C: No results for input(s): HGBA1C in the last 72 hours. CBG: Recent Labs  Lab 04/17/19 1549 04/17/19 1952 04/17/19 2308 04/18/19 0334 04/18/19 0936  GLUCAP 156* 92 81 92 148*   Lipid Profile: No results for input(s): CHOL, HDL, LDLCALC, TRIG, CHOLHDL, LDLDIRECT in the last 72 hours. Thyroid Function Tests: No results for input(s): TSH, T4TOTAL, FREET4, T3FREE, THYROIDAB in the last 72 hours. Anemia Panel: Recent Labs    04/17/19 0259 04/18/19 0524  FERRITIN 993* 758*   Urine analysis: No results found for: COLORURINE, APPEARANCEUR, LABSPEC, PHURINE, GLUCOSEU, HGBUR, BILIRUBINUR, KETONESUR, PROTEINUR, UROBILINOGEN, NITRITE, LEUKOCYTESUR Sepsis Labs: @LABRCNTIP (procalcitonin:4,lacticacidven:4)  ) Recent Results (from the past 240 hour(s))  Blood culture (routine x 2)     Status: None   Collection Time: 04/08/19 11:15 AM  Result Value Ref Range Status   Specimen Description BLOOD RIGHT ANTECUBITAL  Final   Special Requests   Final    BOTTLES DRAWN AEROBIC AND ANAEROBIC Blood Culture adequate volume   Culture   Final    NO GROWTH 5 DAYS Performed at [facility], [address], [place], [place] [address]    Report Status 04/13/2019 FINAL  Final  Expectorated sputum assessment w rflx to resp cult     Status: None   Collection Time: 04/10/19  3:07 PM  Result Value Ref Range Status   Specimen Description SPUTUM  Final   Special Requests NONE  Final   Sputum evaluation   Final    THIS SPECIMEN IS ACCEPTABLE FOR SPUTUM CULTURE Performed at [facility], [place] [address], [place], [place] [address]    Report Status 04/11/2019 FINAL  Final  Culture, respiratory     Status: None    Collection Time: 04/10/19  3:07 PM  Result Value Ref Range Status   Specimen Description   Final    SPUTUM Performed at [facility] [address] [place], [place] [address]    Special Requests   Final    NONE Reflexed from [id number] Performed at [facility], [place] [address] [place], [place] [address]    Gram Stain   Final    MODERATE WBC PRESENT, PREDOMINANTLY PMN NO ORGANISMS SEEN    Culture   Final    NO GROWTH 2 DAYS Performed at [facility] [address], [place], [place] [address]    Report Status 04/13/2019 FINAL  Final  MRSA PCR Screening     Status: None   Collection Time: 04/13/19  1:26 PM  Result Value Ref Range Status   MRSA by PCR NEGATIVE NEGATIVE Final    Comment:        The GeneXpert MRSA Assay (FDA approved for NASAL specimens only), is one component of a comprehensive MRSA colonization surveillance program. It is not intended to diagnose MRSA infection nor to guide or monitor treatment for MRSA infections. Performed at [facility], [place] [address] [place], [place] [address]          Radiology Studies: No results found.      Scheduled Meds: . aspirin  325 mg Oral Daily  . Chlorhexidine Gluconate Cloth  6 each Topical Daily  . dextromethorphan  30 mg Per  Tube BID   And  . guaiFENesin  15 mL Per Tube Q6H  . enoxaparin (LOVENOX) injection  40 mg Subcutaneous Q12H  . insulin aspart  0-9 Units Subcutaneous Q4H  . insulin aspart  3 Units Subcutaneous Q4H  . Ipratropium-Albuterol  1 puff Inhalation Q6H WA  . mouth rinse  15 mL Mouth Rinse BID  . metoprolol tartrate  12.5 mg Oral BID  . multivitamin  15 mL Per Tube Daily  . sodium chloride flush  3 mL Intravenous Q12H  . tamsulosin  0.4 mg Oral QPC breakfast   Continuous Infusions: . sodium chloride Stopped (04/16/19 2015)     LOS: 10 days    Time spent: 35 minutes Time was spent with the patient, face to face or on the  floor, with greater than 50% of total time spent examining patient, counseling patient and/or family regarding COVID-19 and new Afib, and coordinating care.   During this encounter: Patient Isolation: Airborne, contact, droplet HCP PPE: Face shield, head covering, N95, gown, gloves, and shoe covers    Alberteen Samhristopher P Garvis Downum, MD [facility] 04/18/2019, 10:59 AM     Please page through AMION:  [email or web address] Password [email or web address] If 7PM-7AM, please contact night-coverage

## 2019-04-18 NOTE — Progress Notes (Signed)
Occupational Therapy Treatment Patient Details Name: Melvin Mills MRN: [id number] DOB: 04/18/1971 Today's Date: 04/18/2019    History of present illness 48 y.o. male admitted on 04/08/19 with fever.  Pt dx with COVID-19.  Pt became progressively hypoxic requiring intubation 04/10/19 attempted extubation 04/12/19, but developed new onset a-fib and respiratory distress and had to be re-intubated on the same day.  Pt with no other listed PMH on file.   Pt also with hypokalemia and hyperglycemia.     OT comments  Pt progressing towards established OT goals. Pt motivated to participate in therapy and wanting to perform OOB activity. Pt participate in BUE/BLE AROM at EOB; 10 reps each. Pt performing LB bathing with Min A and donned socks at EOB with Min guard A. Pt performing functional mobility for short distance with Min A and RW. SpO2 dropping to 88% on 10L O2 via HFNC and HR elevated to 102 during mobility. Continue to recommend dc home once medically stable and will continue to follow acutely as admitted.   During mobility: HR 102 RR 47 SpO2 88% on 10L HFNC  At rest in recliner: HR 77 RR32 SpO2 95% on 10L HFNC    Follow Up Recommendations  Home health OT;Supervision/Assistance - 24 hour    Equipment Recommendations  3 in 1 bedside commode    Recommendations for Other Services PT consult    Precautions / Restrictions Precautions Precautions: Fall;Other (comment)(Spo2) Precaution Comments: monitor vitals Restrictions Weight Bearing Restrictions: No       Mobility Bed Mobility Overal bed mobility: Needs Assistance Bed Mobility: Supine to Sit     Supine to sit: Min assist     General bed mobility comments: Min A to pull on OT's into upright position  Transfers Overall transfer level: Needs assistance   Transfers: Sit to/from Stand Sit to Stand: Min assist         General transfer comment: Min A balance in standing.     Balance Overall balance assessment: Needs  assistance Sitting-balance support: Feet supported Sitting balance-Leahy Scale: Good Sitting balance - Comments: sitting at EOB and donning socks   Standing balance support: Bilateral upper extremity supported;During functional activity Standing balance-Leahy Scale: Poor Standing balance comment: Reliant on UE support                           ADL either performed or assessed with clinical judgement   ADL Overall ADL's : Needs assistance/impaired Eating/Feeding: Supervision/ safety;Set up;Sitting Eating/Feeding Details (indicate cue type and reason): set up for lunch in recliner         Lower Body Bathing: Min guard;Minimal assistance;Sit to/from stand Lower Body Bathing Details (indicate cue type and reason): Pt performing LB bathing with RN cleaning back peri area once pt standing. Min Guard A for safety in standing. Pt washing his legs while in recliner     Lower Body Dressing: Min guard;Sit to/from stand Lower Body Dressing Details (indicate cue type and reason): Pt donning socks while sitting at EOB. Min Guard A for safety in standing. Requiring increased time and effort Toilet Transfer: Minimal assistance;Ambulation(simulated to recliner) StatisticianToilet Transfer Details (indicate cue type and reason): Min A for safety and balance during mobility         Functional mobility during ADLs: Minimal assistance;Rolling walker General ADL Comments: Pt motivated to participate in therapy.      Vision       Perception     Praxis  Cognition Arousal/Alertness: Awake/alert Behavior During Therapy: WFL for tasks assessed/performed Overall Cognitive Status: Difficult to assess                                 General Comments: Pt answering simple yes/no question. Following cues and motivated to participate in therapy        Exercises Exercises: General Upper Extremity;General Lower Extremity General Exercises - Upper Extremity Shoulder Flexion:  AAROM;AROM;Both;10 reps;Seated Elbow Flexion: AROM;AAROM;Both;10 reps;Seated Elbow Extension: AROM;AAROM;Both;10 reps;Seated Digit Composite Flexion: AROM;Both;10 reps General Exercises - Lower Extremity Short Arc Quad: AROM;Both;10 reps;Seated   Shoulder Instructions       General Comments During activity, pt HR     Pertinent Vitals/ Pain       Pain Assessment: No/denies pain  Home Living                                          Prior Functioning/Environment              Frequency  Min 3X/week        Progress Toward Goals  OT Goals(current goals can now be found in the care plan section)  Progress towards OT goals: Progressing toward goals  Acute Rehab OT Goals Patient Stated Goal: nodd "yes" to getting stronger  OT Goal Formulation: With patient Time For Goal Achievement: 04/29/19 Potential to Achieve Goals: Good ADL Goals Pt Will Perform Upper Body Bathing: with set-up;sitting Pt Will Perform Lower Body Bathing: with min guard assist;sit to/from stand Pt Will Perform Upper Body Dressing: with set-up;sitting Pt Will Perform Lower Body Dressing: with min guard assist;sit to/from stand Pt Will Transfer to Toilet: with supervision;regular height toilet;ambulating Pt/caregiver will Perform Home Exercise Program: Increased strength;Both right and left upper extremity;With written HEP provided;With theraband  Plan Discharge plan remains appropriate    Co-evaluation                 AM-PAC OT "6 Clicks" Daily Activity     Outcome Measure   Help from another person eating meals?: A Little Help from another person taking care of personal grooming?: A Little Help from another person toileting, which includes using toliet, bedpan, or urinal?: A Lot Help from another person bathing (including washing, rinsing, drying)?: A Lot Help from another person to put on and taking off regular upper body clothing?: A Little Help from another person to  put on and taking off regular lower body clothing?: A Little 6 Click Score: 16    End of Session Equipment Utilized During Treatment: Rolling walker;Oxygen(10L)  OT Visit Diagnosis: Unsteadiness on feet (R26.81);Muscle weakness (generalized) (M62.81);Other abnormalities of gait and mobility (R26.89)   Activity Tolerance Patient tolerated treatment well   Patient Left in chair;with call bell/phone within reach;with nursing/sitter in room   Nurse Communication Mobility status        Time: 6387-5643 OT Time Calculation (min): 31 min  Charges: OT General Charges $OT Visit: 1 Visit OT Treatments $Self Care/Home Management : 23-37 mins  [place], OTR/L Acute Rehab Pager: [phone] Office: [place] 04/18/2019, 1:58 PM

## 2019-04-18 NOTE — Progress Notes (Signed)
Spoke to patient's Daughter at [place] and gave her updates on patient. Told her patient would probably be transferred to PCU today. She said patient is excited to get well so that he can go home. She was able to face time with patient. Answered all questions regarding patient care.

## 2019-04-18 NOTE — Progress Notes (Signed)
Removed foley catheter per provider orders, patient tolerated well. Patient due to void.

## 2019-04-19 ENCOUNTER — Other Ambulatory Visit: Payer: Self-pay

## 2019-04-19 ENCOUNTER — Ambulatory Visit (INDEPENDENT_AMBULATORY_CARE_PROVIDER_SITE_OTHER): Payer: Self-pay | Admitting: Primary Care

## 2019-04-19 ENCOUNTER — Encounter (INDEPENDENT_AMBULATORY_CARE_PROVIDER_SITE_OTHER): Payer: Self-pay | Admitting: Primary Care

## 2019-04-19 DIAGNOSIS — Z0000 Encounter for general adult medical examination without abnormal findings: Secondary | ICD-10-CM

## 2019-04-19 DIAGNOSIS — Z09 Encounter for follow-up examination after completed treatment for conditions other than malignant neoplasm: Secondary | ICD-10-CM

## 2019-04-19 DIAGNOSIS — J069 Acute upper respiratory infection, unspecified: Secondary | ICD-10-CM

## 2019-04-19 DIAGNOSIS — U071 COVID-19: Secondary | ICD-10-CM

## 2019-04-19 LAB — CBC
HCT: 42.8 % (ref 39.0–52.0)
Hemoglobin: 13.3 g/dL (ref 13.0–17.0)
MCH: 26.2 pg (ref 26.0–34.0)
MCHC: 31.1 g/dL (ref 30.0–36.0)
MCV: 84.3 fL (ref 80.0–100.0)
Platelets: 195 10*3/uL (ref 150–400)
RBC: 5.08 MIL/uL (ref 4.22–5.81)
RDW: 13.2 % (ref 11.5–15.5)
WBC: 5.4 10*3/uL (ref 4.0–10.5)
nRBC: 0 % (ref 0.0–0.2)

## 2019-04-19 LAB — GLUCOSE, CAPILLARY
Glucose-Capillary: 110 mg/dL — ABNORMAL HIGH (ref 70–99)
Glucose-Capillary: 144 mg/dL — ABNORMAL HIGH (ref 70–99)
Glucose-Capillary: 168 mg/dL — ABNORMAL HIGH (ref 70–99)
Glucose-Capillary: 248 mg/dL — ABNORMAL HIGH (ref 70–99)
Glucose-Capillary: 76 mg/dL (ref 70–99)
Glucose-Capillary: 85 mg/dL (ref 70–99)
Glucose-Capillary: 93 mg/dL (ref 70–99)

## 2019-04-19 LAB — COMPREHENSIVE METABOLIC PANEL
ALT: 28 U/L (ref 0–44)
AST: 32 U/L (ref 15–41)
Albumin: 2.4 g/dL — ABNORMAL LOW (ref 3.5–5.0)
Alkaline Phosphatase: 52 U/L (ref 38–126)
Anion gap: 11 (ref 5–15)
BUN: 37 mg/dL — ABNORMAL HIGH (ref 6–20)
CO2: 27 mmol/L (ref 22–32)
Calcium: 8.5 mg/dL — ABNORMAL LOW (ref 8.9–10.3)
Chloride: 98 mmol/L (ref 98–111)
Creatinine, Ser: 0.92 mg/dL (ref 0.61–1.24)
GFR calc Af Amer: >60 mL/min (ref >60)
GFR calc non Af Amer: >60 mL/min (ref >60)
Glucose, Bld: 88 mg/dL (ref 70–99)
Potassium: 3.8 mmol/L (ref 3.5–5.1)
Sodium: 136 mmol/L (ref 135–145)
Total Bilirubin: 0.6 mg/dL (ref 0.3–1.2)
Total Protein: 6.4 g/dL — ABNORMAL LOW (ref 6.5–8.1)

## 2019-04-19 LAB — FERRITIN: Ferritin: 637 ng/mL — ABNORMAL HIGH (ref 24–336)

## 2019-04-19 LAB — D-DIMER, QUANTITATIVE: D-Dimer, Quant: 1.72 ug/mL-FEU — ABNORMAL HIGH (ref 0.00–0.50)

## 2019-04-19 LAB — C-REACTIVE PROTEIN: CRP: 6.2 mg/dL — ABNORMAL HIGH (ref <1.0)

## 2019-04-19 MED ORDER — GUAIFENESIN 100 MG/5ML PO SOLN
15.0000 mL | Freq: Four times a day (QID) | ORAL | Status: DC
  Administered 2019-04-19 – 2019-04-30 (×44): 300 mg via ORAL
  Filled 2019-04-19 (×14): qty 15
  Filled 2019-04-19: qty 45
  Filled 2019-04-19 (×5): qty 15
  Filled 2019-04-19: qty 45
  Filled 2019-04-19 (×3): qty 15
  Filled 2019-04-19: qty 45
  Filled 2019-04-19 (×13): qty 15

## 2019-04-19 MED ORDER — ADULT MULTIVITAMIN LIQUID CH
15.0000 mL | Freq: Every day | ORAL | Status: DC
  Administered 2019-04-19 – 2019-04-30 (×11): 15 mL via ORAL
  Filled 2019-04-19 (×12): qty 15

## 2019-04-19 MED ORDER — DOCUSATE SODIUM 50 MG/5ML PO LIQD
100.0000 mg | Freq: Two times a day (BID) | ORAL | Status: DC | PRN
  Administered 2019-04-22 – 2019-04-29 (×2): 100 mg via ORAL
  Filled 2019-04-19 (×2): qty 10

## 2019-04-19 MED ORDER — DEXTROMETHORPHAN POLISTIREX ER 30 MG/5ML PO SUER
30.0000 mg | Freq: Two times a day (BID) | ORAL | Status: DC
  Administered 2019-04-19 – 2019-04-30 (×21): 30 mg via ORAL
  Filled 2019-04-19 (×27): qty 5

## 2019-04-19 NOTE — Progress Notes (Signed)
Physical Therapy Treatment Patient Details Name: Melvin Mills MRN: [id number] DOB: 1971-11-01 Today's Date: 04/19/2019    History of Present Illness 48 y.o. male admitted on 04/08/19 with fever.  Pt dx with COVID-19.  Pt became progressively hypoxic requiring intubation 04/10/19 attempted extubation 04/12/19, but developed new onset a-fib and respiratory distress and had to be re-intubated on the same day.  Extubated 6/5. Pt with no other listed PMH on file.   Pt also with hypokalemia and hyperglycemia.      PT Comments    Pt making good progress. Able to amb in room on 10L with SpO2 >90%.    Follow Up Recommendations  Home health PT;Supervision for mobility/OOB     Equipment Recommendations  None recommended by PT    Recommendations for Other Services       Precautions / Restrictions Precautions Precautions: Fall Precaution Comments: monitor vitals Restrictions Weight Bearing Restrictions: No    Mobility  Bed Mobility               General bed mobility comments: Pt up in chair  Transfers Overall transfer level: Needs assistance Equipment used: Standard walker Transfers: Sit to/from Stand Sit to Stand: Min guard         General transfer comment: Assist for safety and lines  Ambulation/Gait Ambulation/Gait assistance: Min assist Gait Distance (Feet): 40 Feet Assistive device: Standard walker;1 person hand held assist Gait Pattern/deviations: Step-through pattern;Decreased stride length Gait velocity: decr Gait velocity interpretation: 1.31 - 2.62 ft/sec, indicative of limited community ambulator General Gait Details: Unsteady gait requiring assist for balance   Stairs             Wheelchair Mobility    Modified Rankin (Stroke Patients Only)       Balance Overall balance assessment: Needs assistance Sitting-balance support: Feet supported;No upper extremity supported Sitting balance-Leahy Scale: Good     Standing balance support: No upper  extremity supported Standing balance-Leahy Scale: Fair                              Cognition Arousal/Alertness: Awake/alert Behavior During Therapy: WFL for tasks assessed/performed Overall Cognitive Status: Within Functional Limits for tasks assessed                                        Exercises General Exercises - Lower Extremity Long Arc Quad: Both;10 reps;Seated Hip Flexion/Marching: Both;10 reps;Seated    General Comments General comments (skin integrity, edema, etc.): Pt on 10L hi-flo with SpO2 >90% with activity      Pertinent Vitals/Pain Pain Assessment: No/denies pain    Home Living                      Prior Function            PT Goals (current goals can now be found in the care plan section) Progress towards PT goals: Progressing toward goals    Frequency    Min 5X/week      PT Plan Current plan remains appropriate    Co-evaluation              AM-PAC PT "6 Clicks" Mobility   Outcome Measure  Help needed turning from your back to your side while in a flat bed without using bedrails?: A Little Help needed moving from lying on your  back to sitting on the side of a flat bed without using bedrails?: A Little Help needed moving to and from a bed to a chair (including a wheelchair)?: A Little Help needed standing up from a chair using your arms (e.g., wheelchair or bedside chair)?: A Little Help needed to walk in hospital room?: A Little Help needed climbing 3-5 steps with a railing? : A Little 6 Click Score: 18    End of Session Equipment Utilized During Treatment: Oxygen Activity Tolerance: Patient tolerated treatment well Patient left: in chair;with call bell/phone within reach(in bed in fully upright chair mode.) Nurse Communication: Mobility status PT Visit Diagnosis: Muscle weakness (generalized) (M62.81);Difficulty in walking, not elsewhere classified (R26.2)     Time: [id number] PT Time  Calculation (min) (ACUTE ONLY): 18 min  Charges:  $Gait Training: 8-22 mins                     [facility] [place] PT Acute Rehabilitation Services Pager [phone] Office [phone]    Angelina [facility]Cary W [facility] 04/19/2019, 1:52 PM

## 2019-04-19 NOTE — Progress Notes (Signed)
PROGRESS NOTE    Melvin Mills  ZOX:[id number] DOB: 02/01/1971 DOA: 04/08/2019 PCP: Melvin Mills, Melvin P, NP      Brief Narrative:   Melvin Mills is a 48 y.o. M with no significant PMHx who presented with 1 week fever, SOB, productive cough.    In the ER, CXR showed bilateral infiltrates.  SARS-CoV-2 NAA positive.  Admitted to floor.  Progressively hypoxic 5/30 requiring intubation.  Extubated 6/1, developed respiratory distress and new onset Afib and was reintubated.  5/28 admitted 5/30 transferred to ICU and intubated 6/1 Extubated, developed atrial fibrillation with RVR, re-intubated 6/5 Extubated  Subjective:  Afebrile, reports sore throat, no chest pain, no dyspnea,has Cough,   Assessment & Plan:  Coronavirus pneumonitis with acute hypoxic respiratory failure In setting of ongoing 2020 COVID-19 pandemic. - he remains with significant Oxygen requirement, he is on 10 L HF morning, growing, out of bed to chair, and use incentive,wean as  tolerated. - S/Mills convalescent plasma 6/1.   - S/Mills Azithro and ceftriaxone x5d. - S/Mills remdesivir 5d 5/30 to 6/3 - Inflammatory markers trending down.continue to  Monitor closely. - Continue Lovenox   COVID-19 Labs Recent Labs    04/17/19 0259 04/18/19 0524 04/19/19 0344  DDIMER 0.55* 1.32* 1.72*  FERRITIN 993* 758* 637*  CRP 17.8* 9.1* 6.2*       New onset atrial fibrillation - This developed in the intensive care unit after extubation the first time.  This lasted less than 24 hours.  No previous history.  CHA2DS2-Vasc 0.    Remains in stable sinus rhythm.  TSH low, T4 normal --> euthyroid sick.   Repeat TSH in 3 months. -Continue aspirin 325 -Continue metoprolol -Obtain Echo when able  Hypokalemia K normal  Hypernatremia  Resolved  Hyperglycemia Glucose low this morning. A1c shows 6.2%, pre-diabetes only. -Stop Lantus -Continue SSI corrections only as needed   Transaminitis Resolved  Urinary retention  -Continue Flomax -D/c foley -BladderScan PRN      MDM and disposition: The below labs and imaging reports were reviewed and summarized above.  Medication management as above.  The patient was admitted with acute hypoxic respiratory failure form COVID-19.      Ventilator best practice:  Diet: Diabetic VAP protocol (if indicated): N/A GI prophylaxis: N/A Glucose control: Insulin Mobility: PT  DVT prophylaxis: Lovenox Code Status: FULL     Consultants:   Melvin Mills  Procedures:   5/30 ETT  6/1 extubation  6/1 ETT x2 (reintubation)  6/5 extubation  Antimicrobials:   Azithromycin 5/29>> 6/1  Ceftriaxone 5/28>> 6/1  Remdesivir 5/30 >> 6/3  Culture data:   5/28 blood culture x1 -- NG  5/30 respiratory culture --NG  MRSA nares negative     Objective: Vitals:   04/19/19 0400 04/19/19 0500 04/19/19 0810 04/19/19 1010  BP: 91/71 94/67  106/73  Pulse: 78 80  100  Resp: (!) 28 (!) 30  (!) 31  Temp: 98.5 F (36.9 C)  98.9 F (37.2 C)   TempSrc: Axillary     SpO2: 94% 97%  95%  Weight:      Height:        Intake/Output Summary (Last 24 hours) at 04/19/2019 1203 Last data filed at 04/19/2019 0810 Gross per 24 hour  Intake 20 ml  Output 400 ml  Net -380 ml   Filed Weights   04/11/19 0600 04/12/19 0500 04/13/19 0600  Weight: 73.6 kg 75.6 kg 74.9 kg    Examination:  Awake Alert, Oriented X 3,  No new F.N deficits, Normal affect Symmetrical Chest wall movement, Good air movement bilaterally, CTAB RRR,No Gallops,Rubs or new Murmurs, No Parasternal Heave +ve B.Sounds, Abd Soft, No tenderness, No rebound - guarding or rigidity. No Cyanosis, Clubbing or edema, No new Rash or bruise       Data Reviewed: I have personally reviewed following labs and imaging studies:  CBC: Recent Labs  Lab 04/13/19 0530  04/15/19 0411 04/16/19 0550 04/17/19 0259 04/18/19 0524 04/19/19 0344  WBC 11.9*   < > 10.0 9.2 8.0 5.3 5.4  NEUTROABS 10.8*  --   --   --    --   --   --   HGB 14.0   < > 15.4 15.3 16.3 14.2 13.3  HCT 43.3   < > 47.8 48.8 53.4* 44.5 42.8  MCV 83.9   < > 84.2 85.6 86.4 84.8 84.3  PLT 183   < > 206 207 234 216 195   < > = values in this interval not displayed.   Basic Metabolic Panel: Recent Labs  Lab 04/12/19 2130  04/15/19 0411 04/16/19 0550 04/17/19 0259 04/18/19 0524 04/19/19 0344  NA 143   < > 145 145 150* 140 136  K 3.5   < > 3.5 4.2 4.3 3.9 3.8  CL 101   < > 103 104 106 99 98  CO2 30   < > 30 32 31 30 27   GLUCOSE 149*   < > 192* 241* 102* 68* 88  BUN 34*   < > 45* 49* 62* 44* 37*  CREATININE 0.99   < > 1.08 1.09 1.27* 1.02 0.92  CALCIUM 8.2*   < > 8.7* 8.8* 9.3 8.7* 8.5*  MG 2.5*  --   --  2.7*  --   --   --    < > = values in this interval not displayed.   GFR: Estimated Creatinine Clearance: 87.1 mL/min (by C-G formula based on SCr of 0.92 mg/dL). Liver Function Tests: Recent Labs  Lab 04/15/19 0411 04/16/19 0550 04/17/19 0259 04/18/19 0524 04/19/19 0344  AST 49* 21 22 27  32  ALT 85* 56* 45* 32 28  ALKPHOS 98 97 75 59 52  BILITOT 0.7 1.0 1.1 0.9 0.6  PROT 7.2 7.4 7.9 6.7 6.4*  ALBUMIN 2.8* 2.8* 2.9* 2.4* 2.4*   No results for input(s): LIPASE, AMYLASE in the last 168 hours. No results for input(s): AMMONIA in the last 168 hours. Coagulation Profile: No results for input(s): INR, PROTIME in the last 168 hours. Cardiac Enzymes: No results for input(s): CKTOTAL, CKMB, CKMBINDEX, TROPONINI in the last 168 hours. BNP (last 3 results) No results for input(s): PROBNP in the last 8760 hours. HbA1C: No results for input(s): HGBA1C in the last 72 hours. CBG: Recent Labs  Lab 04/18/19 1642 04/18/19 1930 04/18/19 2328 04/19/19 0313 04/19/19 0807  GLUCAP 164* 110* 84 93 85   Lipid Profile: No results for input(s): CHOL, HDL, LDLCALC, TRIG, CHOLHDL, LDLDIRECT in the last 72 hours. Thyroid Function Tests: No results for input(s): TSH, T4TOTAL, FREET4, T3FREE, THYROIDAB in the last 72 hours.  Anemia Panel: Recent Labs    04/18/19 0524 04/19/19 0344  FERRITIN 758* 637*   Urine analysis: No results found for: COLORURINE, APPEARANCEUR, LABSPEC, PHURINE, GLUCOSEU, HGBUR, BILIRUBINUR, KETONESUR, PROTEINUR, UROBILINOGEN, NITRITE, LEUKOCYTESUR Sepsis Labs: @LABRCNTIP (procalcitonin:4,lacticacidven:4)  ) Recent Results (from the past 240 hour(s))  Expectorated sputum assessment w rflx to resp cult     Status: None   Collection Time: 04/10/19  3:07 PM  Result Value Ref Range Status   Specimen Description SPUTUM  Final   Special Requests NONE  Final   Sputum evaluation   Final    THIS SPECIMEN IS ACCEPTABLE FOR SPUTUM CULTURE Performed at [facility], [address] [place], [place] [address]    Report Status 04/11/2019 FINAL  Final  Culture, respiratory     Status: None   Collection Time: 04/10/19  3:07 PM  Result Value Ref Range Status   Specimen Description   Final    SPUTUM Performed at [facility], [address] [place], [place] [address]    Special Requests   Final    NONE Reflexed from [id number] Performed at [facility], [address] [place], [place] [address]    Gram Stain   Final    MODERATE WBC PRESENT, PREDOMINANTLY PMN NO ORGANISMS SEEN    Culture   Final    NO GROWTH 2 DAYS Performed at [facility], [address] [place], [place] [address]    Report Status 04/13/2019 FINAL  Final  MRSA PCR Screening     Status: None   Collection Time: 04/13/19  1:26 PM  Result Value Ref Range Status   MRSA by PCR NEGATIVE NEGATIVE Final    Comment:        The GeneXpert MRSA Assay (FDA approved for NASAL specimens only), is one component of a comprehensive MRSA colonization surveillance program. It is not intended to diagnose MRSA infection nor to guide or monitor treatment for MRSA infections. Performed at [facility], [address] [place], [place] [address]           Radiology Studies: No results found.      Scheduled Meds: . aspirin  325 mg Oral Daily  . Chlorhexidine Gluconate Cloth  6 each Topical Daily  . dextromethorphan  30 mg Oral BID   And  . guaiFENesin  15 mL Oral Q6H  . enoxaparin (LOVENOX) injection  40 mg Subcutaneous Q12H  . insulin aspart  0-9 Units Subcutaneous Q4H  . insulin aspart  3 Units Subcutaneous Q4H  . Ipratropium-Albuterol  1 puff Inhalation Q6H WA  . mouth rinse  15 mL Mouth Rinse BID  . metoprolol tartrate  12.5 mg Oral BID  . multivitamin  15 mL Oral Daily  . sodium chloride flush  3 mL Intravenous Q12H  . tamsulosin  0.4 mg Oral QPC breakfast   Continuous Infusions: . sodium chloride Stopped (04/16/19 2015)     LOS: 11 days    Time spent: 25 minutes Time was spent with the patient, face to face or on the floor, with greater than 50% of total time spent examining patient, counseling patient and/or family regarding COVID-19 and new Afib, and coordinating care.      Melvin Bienenstockawood Dennie Vecchio, MD Melvin Mills 04/19/2019, 12:03 PM     Please page through AMION:  [email or web address] Password [email or web address] If 7PM-7AM, please contact night-coverage

## 2019-04-19 NOTE — Progress Notes (Signed)
Virtual Visit via Telephone Note  I connected with Melvin Mills on 04/19/19 at 10:10 AM EDT by telephone and verified that I am speaking with the correct person using two identifiers.   I discussed the limitations, risks, security and privacy concerns of performing an evaluation and management service by telephone and the availability of in person appointments. I also discussed with the patient that there may be a patient responsible charge related to this service. The patient expressed understanding and agreed to proceed.   History of Present Illness: Melvin Mills is a 48 y.o. male with no known medical history presenting with fever.  He reports several days of fever, SOB, chills, cough productive of yellowish sputum, and generalized malaise.  He has multiple people he is in contacts within his household   Observations/Objective: Review of Systems  Constitutional: Negative.   HENT: Positive for sore throat.        No taste/smell  Eyes: Negative.   Respiratory: Positive for sputum production and shortness of breath.        Yellow phlegm   Cardiovascular: Negative.   Gastrointestinal: Negative.   Genitourinary: Negative.   Musculoskeletal: Positive for myalgias.  Skin: Negative.   Neurological: Positive for weakness.  Endo/Heme/Allergies: Positive for environmental allergies.    Assessment and Plan: Melvin was seen today for hospitalization follow-up.  Diagnoses and all orders for this visit:  Encounter for medical examination to establish care Juluis Mire, NP-C will be your  (PCP) that will  provides both the first contact for a person with an undiagnosed health concern as well as continuing care of varied medical conditions, not limited by cause, organ system, or diagnosis.   Hospital discharge follow-up Patient was still in hospital at this visit     Acute respiratory disease due to COVID-19 virus Your test for COVID-19 was positive, meaning that you were infected  with the novel coronavirus and could give the germ to others.  Please continue isolation at home, for at least 10 days since the start of your fever/cough/breathlessness and until you have had 3 consecutive days without fever (without taking a fever reducer) and with cough/breathlessness improving. Please continue good preventive care measures, including:  frequent hand-washing, avoid touching your face, cover coughs/sneezes, stay out of crowds and keep a 6 foot distance from others.  Recheck or go to the nearest hospital ED tent for re-assessment if fever/cough/breathlessness return.   Follow Up Instructions    I discussed the assessment and treatment plan with the patient. The patient was provided an opportunity to ask questions and all were answered. The patient agreed with the plan and demonstrated an understanding of the instructions.   The patient was advised to call back or seek an in-person evaluation if the symptoms worsen or if the condition fails to improve as anticipated.  I provided 15 minutes of non-face-to-face time during this encounter.   Kerin Perna, NP

## 2019-04-19 NOTE — Progress Notes (Signed)
[facility] Department and AHF Team  ReDS Research Project   Patient #: [id number]  ReDS Measurement  Right: 44%  Left: 47%

## 2019-04-19 NOTE — Progress Notes (Addendum)
Occupational Therapy Treatment Patient Details Name: Melvin Mills MRN: [id number] DOB: 11/10/1971 Today's Date: 04/19/2019    History of present illness 48 y.o. male admitted on 04/08/19 with fever.  Pt dx with COVID-19.  Pt became progressively hypoxic requiring intubation 04/10/19 attempted extubation 04/12/19, but developed new onset a-fib and respiratory distress and had to be re-intubated on the same day.  Extubated 6/5. Pt with no other listed PMH on file.   Pt also with hypokalemia and hyperglycemia.     OT comments  Pt progressing towards established OT goals and very motivated to participate in therapy. Pt performing functional mobility to recliner with Min Guard A for short distance. With set up, pt eating breakfast up in recliner and presenting with decreased SpO2 to 89% and increased RR in 40s while on 12L HF. Pt's niece calling and updated her on pt's progress with therapy; she had concerns with him eating and was happy to hear he was OOB eating breakfast. Continue to recommend dc to home with HHOT and will continue to follow acutely as admitted.   Follow Up Recommendations  Home health OT;Supervision/Assistance - 24 hour    Equipment Recommendations  3 in 1 bedside commode    Recommendations for Other Services PT consult    Precautions / Restrictions Precautions Precautions: Fall Precaution Comments: monitor vitals Restrictions Weight Bearing Restrictions: No       Mobility Bed Mobility Overal bed mobility: Needs Assistance Bed Mobility: Supine to Sit     Supine to sit: Min guard     General bed mobility comments: Min Guard A for safety  Transfers Overall transfer level: Needs assistance Equipment used: Standard walker Transfers: Sit to/from Stand Sit to Stand: Min guard         General transfer comment: Assist for safety and lines    Balance Overall balance assessment: Needs assistance Sitting-balance support: Feet supported;No upper extremity  supported Sitting balance-Leahy Scale: Good     Standing balance support: No upper extremity supported Standing balance-Leahy Scale: Fair                             ADL either performed or assessed with clinical judgement   ADL Overall ADL's : Needs assistance/impaired   Eating/Feeding Details (indicate cue type and reason): Assist with set up for breakfast                     Toilet Transfer: Min guard;Ambulation(Simulated to recliner) StatisticianToilet Transfer Details (indicate cue type and reason): Min Guard A for safety. No UE support required         Functional mobility during ADLs: Min guard General ADL Comments: Pt performing transfer to recliner for breakfast. SpO2 dropping 92-89% on 12L HF. HR 100s and RR 47-45. Noting decrease in O2 states and increase in RR when pt eating breakfast. Discussed therapy status with niece who called while OT in room.     Vision       Perception     Praxis      Cognition Arousal/Alertness: Awake/alert Behavior During Therapy: WFL for tasks assessed/performed Overall Cognitive Status: Within Functional Limits for tasks assessed                                 General Comments: Continues to follow simple commands.        Exercises    Shoulder Instructions  General Comments BP 103/76    Pertinent Vitals/ Pain       Pain Assessment: No/denies pain  Home Living                                          Prior Functioning/Environment              Frequency  Min 3X/week        Progress Toward Goals  OT Goals(current goals can now be found in the care plan section)  Progress towards OT goals: Progressing toward goals  Acute Rehab OT Goals Patient Stated Goal: nodd "yes" to getting stronger  OT Goal Formulation: With patient Time For Goal Achievement: 04/29/19 Potential to Achieve Goals: Good ADL Goals Pt Will Perform Upper Body Bathing: with set-up;sitting Pt  Will Perform Lower Body Bathing: with min guard assist;sit to/from stand Pt Will Perform Upper Body Dressing: with set-up;sitting Pt Will Perform Lower Body Dressing: with min guard assist;sit to/from stand Pt Will Transfer to Toilet: with supervision;regular height toilet;ambulating Pt/caregiver will Perform Home Exercise Program: Increased strength;Both right and left upper extremity;With written HEP provided;With theraband  Plan Discharge plan remains appropriate    Co-evaluation                 AM-PAC OT "6 Clicks" Daily Activity     Outcome Measure   Help from another person eating meals?: A Little Help from another person taking care of personal grooming?: A Little Help from another person toileting, which includes using toliet, bedpan, or urinal?: A Lot Help from another person bathing (including washing, rinsing, drying)?: A Lot Help from another person to put on and taking off regular upper body clothing?: A Little Help from another person to put on and taking off regular lower body clothing?: A Little 6 Click Score: 16    End of Session Equipment Utilized During Treatment: Oxygen(12L)  OT Visit Diagnosis: Unsteadiness on feet (R26.81);Muscle weakness (generalized) (M62.81);Other abnormalities of gait and mobility (R26.89)   Activity Tolerance Patient tolerated treatment well   Patient Left in chair;with call bell/phone within reach   Nurse Communication Mobility status;Other (comment)(Niece wanting update )        Time: 3419-6222 OT Time Calculation (min): 20 min  Charges: OT General Charges $OT Visit: 1 Visit OT Treatments $Self Care/Home Management : 8-22 mins  [place], OTR/L Acute Rehab Pager: [phone] Office: [place] 04/19/2019, 5:08 PM

## 2019-04-19 NOTE — Progress Notes (Signed)
Pt does endorse pain in the lungs while coughing

## 2019-04-19 NOTE — Progress Notes (Addendum)
Nutrition Follow-up RD working remotely.  DOCUMENTATION CODES:   Not applicable  INTERVENTION:   Recommend liberalize diet to Regular.  Add Ensure Enlive po BID, each supplement provides 350 kcal and 20 grams of protein.  Patient is receiving Hormel Shake daily with breakfast (520 kcals and 22 g of protein) and Magic cup BID with lunch and dinner (290 kcal and 9 grams of protein), automatically on meal trays to optimize nutritional intake.    NUTRITION DIAGNOSIS:   Increased nutrient needs related to acute illness(COVID-19) as evidenced by estimated needs.  Ongoing  GOAL:   Patient will meet greater than or equal to 90% of their needs  Progressing  MONITOR:   Vent status, TF tolerance, Labs, Weight trends  ASSESSMENT:   48 yo [place] male with no known PMH who was admitted with COVID-19 infection. He lives with several family members who are also sick.  Extubated 6/5. TF off since extubation.  Diet advanced to heart healthy CHO modified on 6/6. Unsure of intake, not documented in chart and RN did not answer phone. Suspect intake is poor, given acute illness.  Labs reviewed. BUN 37 (H) CBG's: 93-85 this morning Medications reviewed and include Novolog, MVI, Flomax.  I/O net negative 2.7 L  Diet Order:   Diet Order            Diet heart healthy/carb modified Room service appropriate? Yes; Fluid consistency: Thin  Diet effective now              EDUCATION NEEDS:   No education needs have been identified at this time  Skin:  Skin Assessment: Reviewed RN Assessment  Last BM:  6/5  Height:   Ht Readings from Last 1 Encounters:  04/08/19 5\' 3"  (1.6 m)    Weight:   Wt Readings from Last 1 Encounters:  04/13/19 74.9 kg    Ideal Body Weight:  56.4 kg  BMI:  Body mass index is 29.25 kg/m.  Estimated Nutritional Needs:   Kcal:  8242-3536  Protein:  90-120 gm  Fluid:  2 L    Molli Barrows, RD, LDN, [place] Pager [phone] After Hours  Pager [phone]

## 2019-04-20 ENCOUNTER — Other Ambulatory Visit: Payer: Self-pay

## 2019-04-20 LAB — CBC
HCT: 40.3 % (ref 39.0–52.0)
Hemoglobin: 12.6 g/dL — ABNORMAL LOW (ref 13.0–17.0)
MCH: 26.3 pg (ref 26.0–34.0)
MCHC: 31.3 g/dL (ref 30.0–36.0)
MCV: 84.1 fL (ref 80.0–100.0)
Platelets: 208 10*3/uL (ref 150–400)
RBC: 4.79 MIL/uL (ref 4.22–5.81)
RDW: 12.9 % (ref 11.5–15.5)
WBC: 5.9 10*3/uL (ref 4.0–10.5)
nRBC: 0 % (ref 0.0–0.2)

## 2019-04-20 LAB — COMPREHENSIVE METABOLIC PANEL
ALT: 33 U/L (ref 0–44)
AST: 39 U/L (ref 15–41)
Albumin: 2.3 g/dL — ABNORMAL LOW (ref 3.5–5.0)
Alkaline Phosphatase: 49 U/L (ref 38–126)
Anion gap: 9 (ref 5–15)
BUN: 30 mg/dL — ABNORMAL HIGH (ref 6–20)
CO2: 28 mmol/L (ref 22–32)
Calcium: 8.1 mg/dL — ABNORMAL LOW (ref 8.9–10.3)
Chloride: 95 mmol/L — ABNORMAL LOW (ref 98–111)
Creatinine, Ser: 0.88 mg/dL (ref 0.61–1.24)
GFR calc Af Amer: >60 mL/min (ref >60)
GFR calc non Af Amer: >60 mL/min (ref >60)
Glucose, Bld: 99 mg/dL (ref 70–99)
Potassium: 3.5 mmol/L (ref 3.5–5.1)
Sodium: 132 mmol/L — ABNORMAL LOW (ref 135–145)
Total Bilirubin: 0.4 mg/dL (ref 0.3–1.2)
Total Protein: 6.3 g/dL — ABNORMAL LOW (ref 6.5–8.1)

## 2019-04-20 LAB — GLUCOSE, CAPILLARY
Glucose-Capillary: 101 mg/dL — ABNORMAL HIGH (ref 70–99)
Glucose-Capillary: 107 mg/dL — ABNORMAL HIGH (ref 70–99)
Glucose-Capillary: 237 mg/dL — ABNORMAL HIGH (ref 70–99)
Glucose-Capillary: 128 mg/dL — ABNORMAL HIGH (ref 70–99)
Glucose-Capillary: 137 mg/dL — ABNORMAL HIGH (ref 70–99)

## 2019-04-20 LAB — FERRITIN: Ferritin: 526 ng/mL — ABNORMAL HIGH (ref 24–336)

## 2019-04-20 LAB — PROCALCITONIN: Procalcitonin: 0.34 ng/mL

## 2019-04-20 LAB — BRAIN NATRIURETIC PEPTIDE: B Natriuretic Peptide: 21.8 pg/mL (ref 0.0–100.0)

## 2019-04-20 LAB — D-DIMER, QUANTITATIVE: D-Dimer, Quant: 1.93 ug/mL-FEU — ABNORMAL HIGH (ref 0.00–0.50)

## 2019-04-20 LAB — C-REACTIVE PROTEIN: CRP: 5.6 mg/dL — ABNORMAL HIGH (ref <1.0)

## 2019-04-20 MED ORDER — ENSURE ENLIVE PO LIQD
237.0000 mL | Freq: Two times a day (BID) | ORAL | Status: DC
  Administered 2019-04-20 – 2019-04-30 (×18): 237 mL via ORAL

## 2019-04-20 NOTE — Progress Notes (Signed)
PROGRESS NOTE    Melvin Mills  WVP:[id number] DOB: 1971-08-24 DOA: 04/08/2019 PCP: Kerin Perna, NP      Brief Narrative:   Mr. Melvin Mills is a 48 y.o. M with no significant PMHx who presented with 1 week fever, SOB, productive cough.  In the ER, CXR showed bilateral infiltrates.  SARS-CoV-2 NAA positive.  Admitted to floor. Progressively hypoxic 5/30 requiring intubation.  Extubated 6/1, developed respiratory distress and new onset Afib and was reintubated.  5/28 admitted 5/30 transferred to ICU and intubated 6/1 Extubated, developed atrial fibrillation with RVR, re-intubated 6/5 Extubated  Subjective:  Afebrile, has any fever or chills, reports his sore throat has been improving  Assessment & Plan:  Acute hypoxic respiratory failure due to COVID-19 for pneumonia In setting of ongoing 2020 COVID-19 pandemic. - he remains with significant Oxygen requirement, but was able to wean from 2 L high flow nasal cannula, to 7 L high flow nasal cannula today, patient appears to be more comfortable, but still having some tachypnea, and  hypoxia upon exertion . - S/p convalescent plasma 6/1.   - S/p Azithro and ceftriaxone x5d. - S/p remdesivir 5d 5/30 to 6/3 - Inflammatory markers trending down.continue to  Monitor closely. - Continue Lovenox   COVID-19 Labs Recent Labs    04/18/19 0524 04/19/19 0344 04/20/19 0257  DDIMER 1.32* 1.72* 1.93*  FERRITIN 758* 637* 526*  CRP 9.1* 6.2* 5.6*     New onset atrial fibrillation - This developed in the intensive care unit after extubation the first time.  This lasted less than 24 hours.  No previous history.  CHA2DS2-Vasc 0.    Remains in stable sinus rhythm.  TSH low, T4 normal --> euthyroid sick.   Repeat TSH in 3 months. -Continue aspirin 325 -Continue metoprolol -Obtain Echo when able  Hypokalemia Repleted  Hypernatremia  Resolved, actually he is hyponatremic today, he appears to be a euvolemic, no evidence of volume  overload, especially with proBNP within normal limit  Hyperglycemia Glucose low this morning. A1c shows 6.2%, pre-diabetes only. -Stop Lantus -Continue SSI corrections only as needed   Transaminitis Resolved  Urinary retention -Continue Flomax -D/c foley -BladderScan PRN      MDM and disposition: The below labs and imaging reports were reviewed and summarized above.  Medication management as above.  The patient was admitted with acute hypoxic respiratory failure form COVID-19.      Ventilator best practice:  Diet: Diabetic VAP protocol (if indicated): N/A GI prophylaxis: N/A Glucose control: Insulin Mobility: PT  DVT prophylaxis: Lovenox Code Status: FULL     Consultants:   PCCM  Procedures:   5/30 ETT  6/1 extubation  6/1 ETT x2 (reintubation)  6/5 extubation  Antimicrobials:   Azithromycin 5/29>> 6/1  Ceftriaxone 5/28>> 6/1  Remdesivir 5/30 >> 6/3  Culture data:   5/28 blood culture x1 -- NG  5/30 respiratory culture --NG  MRSA nares negative     Objective: Vitals:   04/20/19 0043 04/20/19 0400 04/20/19 0700 04/20/19 0800  BP:  91/67 97/66   Pulse:  93 96   Resp:  (!) 37 (!) 41   Temp:  99.8 F (37.7 C)  98.3 F (36.8 C)  TempSrc:  Oral    SpO2: 91% 93% 90%   Weight:      Height:        Intake/Output Summary (Last 24 hours) at 04/20/2019 1421 Last data filed at 04/20/2019 0643 Gross per 24 hour  Intake 120 ml  Output 700 ml  Net -580 ml   Filed Weights   04/11/19 0600 04/12/19 0500 04/13/19 0600  Weight: 73.6 kg 75.6 kg 74.9 kg    Examination:  Awake Alert, Oriented X 3, No new F.N deficits, Normal affect Symmetrical Chest wall movement, Good air movement bilaterally, CTAB RRR,No Gallops,Rubs or new Murmurs, No Parasternal Heave +ve B.Sounds, Abd Soft, No tenderness, No rebound - guarding or rigidity. No Cyanosis, Clubbing or edema, No new Rash or bruise    Data Reviewed: I have personally reviewed following  labs and imaging studies:  CBC: Recent Labs  Lab 04/16/19 0550 04/17/19 0259 04/18/19 0524 04/19/19 0344 04/20/19 0257  WBC 9.2 8.0 5.3 5.4 5.9  HGB 15.3 16.3 14.2 13.3 12.6*  HCT 48.8 53.4* 44.5 42.8 40.3  MCV 85.6 86.4 84.8 84.3 84.1  PLT 207 234 216 195 208   Basic Metabolic Panel: Recent Labs  Lab 04/16/19 0550 04/17/19 0259 04/18/19 0524 04/19/19 0344 04/20/19 0257  NA 145 150* 140 136 132*  K 4.2 4.3 3.9 3.8 3.5  CL 104 106 99 98 95*  CO2 32 31 30 27 28   GLUCOSE 241* 102* 68* 88 99  BUN 49* 62* 44* 37* 30*  CREATININE 1.09 1.27* 1.02 0.92 0.88  CALCIUM 8.8* 9.3 8.7* 8.5* 8.1*  MG 2.7*  --   --   --   --    GFR: Estimated Creatinine Clearance: 91.1 mL/min (by C-G formula based on SCr of 0.88 mg/dL). Liver Function Tests: Recent Labs  Lab 04/16/19 0550 04/17/19 0259 04/18/19 0524 04/19/19 0344 04/20/19 0257  AST 21 22 27  32 39  ALT 56* 45* 32 28 33  ALKPHOS 97 75 59 52 49  BILITOT 1.0 1.1 0.9 0.6 0.4  PROT 7.4 7.9 6.7 6.4* 6.3*  ALBUMIN 2.8* 2.9* 2.4* 2.4* 2.3*   No results for input(s): LIPASE, AMYLASE in the last 168 hours. No results for input(s): AMMONIA in the last 168 hours. Coagulation Profile: No results for input(s): INR, PROTIME in the last 168 hours. Cardiac Enzymes: No results for input(s): CKTOTAL, CKMB, CKMBINDEX, TROPONINI in the last 168 hours. BNP (last 3 results) No results for input(s): PROBNP in the last 8760 hours. HbA1C: No results for input(s): HGBA1C in the last 72 hours. CBG: Recent Labs  Lab 04/19/19 1947 04/19/19 2337 04/20/19 0426 04/20/19 0801 04/20/19 1215  GLUCAP 248* 76 101* 107* 237*   Lipid Profile: No results for input(s): CHOL, HDL, LDLCALC, TRIG, CHOLHDL, LDLDIRECT in the last 72 hours. Thyroid Function Tests: No results for input(s): TSH, T4TOTAL, FREET4, T3FREE, THYROIDAB in the last 72 hours. Anemia Panel: Recent Labs    04/19/19 0344 04/20/19 0257  FERRITIN 637* 526*   Urine analysis: No  results found for: COLORURINE, APPEARANCEUR, LABSPEC, PHURINE, GLUCOSEU, HGBUR, BILIRUBINUR, KETONESUR, PROTEINUR, UROBILINOGEN, NITRITE, LEUKOCYTESUR Sepsis Labs: @LABRCNTIP (procalcitonin:4,lacticacidven:4)  ) Recent Results (from the past 240 hour(s))  Expectorated sputum assessment w rflx to resp cult     Status: None   Collection Time: 04/10/19  3:07 PM  Result Value Ref Range Status   Specimen Description SPUTUM  Final   Special Requests NONE  Final   Sputum evaluation   Final    THIS SPECIMEN IS ACCEPTABLE FOR SPUTUM CULTURE Performed at [facility], [address] [place], [place] [address]    Report Status 04/11/2019 FINAL  Final  Culture, respiratory     Status: None   Collection Time: 04/10/19  3:07 PM  Result Value Ref Range  Status   Specimen Description   Final    SPUTUM Performed at [facility], [address] [place], [place] [address]    Special Requests   Final    NONE Reflexed from [id number] Performed at [facility], [address] [place], [place] [address]    Gram Stain   Final    MODERATE WBC PRESENT, PREDOMINANTLY PMN NO ORGANISMS SEEN    Culture   Final    NO GROWTH 2 DAYS Performed at [facility], [address] [place], [place] [address]    Report Status 04/13/2019 FINAL  Final  MRSA PCR Screening     Status: None   Collection Time: 04/13/19  1:26 PM  Result Value Ref Range Status   MRSA by PCR NEGATIVE NEGATIVE Final    Comment:        The GeneXpert MRSA Assay (FDA approved for NASAL specimens only), is one component of a comprehensive MRSA colonization surveillance program. It is not intended to diagnose MRSA infection nor to guide or monitor treatment for MRSA infections. Performed at [facility], [address] [place], [place] [address]          Radiology Studies: No results found.      Scheduled Meds: . aspirin  325 mg Oral Daily  .  Chlorhexidine Gluconate Cloth  6 each Topical Daily  . dextromethorphan  30 mg Oral BID   And  . guaiFENesin  15 mL Oral Q6H  . enoxaparin (LOVENOX) injection  40 mg Subcutaneous Q12H  . feeding supplement (ENSURE ENLIVE)  237 mL Oral BID BM  . insulin aspart  0-9 Units Subcutaneous Q4H  . insulin aspart  3 Units Subcutaneous Q4H  . Ipratropium-Albuterol  1 puff Inhalation Q6H WA  . mouth rinse  15 mL Mouth Rinse BID  . metoprolol tartrate  12.5 mg Oral BID  . multivitamin  15 mL Oral Daily  . sodium chloride flush  3 mL Intravenous Q12H  . tamsulosin  0.4 mg Oral QPC breakfast   Continuous Infusions: . sodium chloride Stopped (04/16/19 2015)     LOS: 12 days    Time spent: 25 minutes Time was spent with the patient, face to face or on the floor, with greater than 50% of total time spent examining patient, counseling patient and/or family regarding COVID-19 and new Afib, and coordinating care.      Huey B[place] Tedric Leeth, MD [facility] 04/20/2019, 2:21 PM     Please page through AMION:  [email or web address] If 7PM-7AM, please contact night-coverage

## 2019-04-20 NOTE — Progress Notes (Signed)
Occupational Therapy Treatment Patient Details Name: Melvin Mills MRN: [id number] DOB: [birth year] Today's Date: 04/20/2019    History of present illness 48 y.o. male admitted on 04/08/19 with fever.  Pt dx with COVID-19.  Pt became progressively hypoxic requiring intubation 04/10/19 attempted extubation 04/12/19, but developed new onset a-fib and respiratory distress and had to be re-intubated on the same day.  Extubated 6/5. Pt with no other listed PMH on file.   Pt also with hypokalemia and hyperglycemia.     OT comments  Pt progressing towards established OT goals. Pt performing toileting at regular toilet with Supervision-Min Guard A; SpO2 dropping to 85% on 6L via HFNC. Elevated to 8L and pt was able to maintain Spo2 >90% during activity. Pt performing hang hygiene at sink and functional mobility with Min Guard A. HR 120s and RR 40s; pt requiring seated rest breaks to decreased RR and HR. Educating pt on theraband exercises; performed 10 reps each. Continue to recommend dc home with HHOT and will continue to follow acutely as admitted.    Follow Up Recommendations  Home health OT;Supervision/Assistance - 24 hour    Equipment Recommendations  3 in 1 bedside commode    Recommendations for Other Services PT consult    Precautions / Restrictions Precautions Precautions: Fall Precaution Comments: monitor vitals Restrictions Weight Bearing Restrictions: No       Mobility Bed Mobility Overal bed mobility: Needs Assistance Bed Mobility: Supine to Sit     Supine to sit: Min guard     General bed mobility comments: Min Guard A for safety  Transfers Overall transfer level: Needs assistance Equipment used: Standard walker Transfers: Sit to/from Stand Sit to Stand: Min guard         General transfer comment: Assist for safety and lines    Balance Overall balance assessment: Needs assistance Sitting-balance support: Feet supported;No upper extremity supported Sitting  balance-Leahy Scale: Good Sitting balance - Comments: sitting at EOB and donning socks   Standing balance support: No upper extremity supported Standing balance-Leahy Scale: Fair                             ADL either performed or assessed with clinical judgement   ADL Overall ADL's : Needs assistance/impaired     Grooming: Wash/dry hands;Min guard;Standing Grooming Details (indicate cue type and reason): Min Guard A for Psychologist, forensic Transfer: Min guard;Ambulation;Regular Glass blower/designer Details (indicate cue type and reason): Min Guard A for safety. No UE support required Toileting- Clothing Manipulation and Hygiene: Supervision/safety;Sitting/lateral lean       Functional mobility during ADLs: Min guard General ADL Comments: Pt performing mobility to bathroom and SpO2 dropping to 85% on 6L during toileting. Elevated to 8L and pt maintaining >90%     Vision   Vision Assessment?: Yes   Perception     Praxis      Cognition Arousal/Alertness: Awake/alert Behavior During Therapy: WFL for tasks assessed/performed Overall Cognitive Status: Within Functional Limits for tasks assessed                                 General Comments: Continues to follow simple commands.        Exercises Exercises: General Upper Extremity;Other exercises General Exercises - Upper Extremity Shoulder Horizontal ABduction: AROM;Both;10 reps;Seated;Theraband Theraband  Level (Shoulder Horizontal Abduction): Level 1 (Yellow) Other Exercises Other Exercises: Seated Diagonal reach with level one theraband. 10 reps. Both sides.    Shoulder Instructions       General Comments SpO2 >90% on 6L at rest. During activity, dropping to 85%. Elevated O2 to 8L and pt sustaining >90% during activity. RR elevated to 44 and HR 123    Pertinent Vitals/ Pain       Pain Assessment: Faces Faces Pain Scale: No hurt Pain Intervention(s): Monitored during  session  Home Living                                          Prior Functioning/Environment              Frequency  Min 3X/week        Progress Toward Goals  OT Goals(current goals can now be found in the care plan section)  Progress towards OT goals: Progressing toward goals  Acute Rehab OT Goals Patient Stated Goal: nodd "yes" to getting stronger  OT Goal Formulation: With patient Time For Goal Achievement: 04/29/19 Potential to Achieve Goals: Good ADL Goals Pt Will Perform Upper Body Bathing: with set-up;sitting Pt Will Perform Lower Body Bathing: with min guard assist;sit to/from stand Pt Will Perform Upper Body Dressing: with set-up;sitting Pt Will Perform Lower Body Dressing: with min guard assist;sit to/from stand Pt Will Transfer to Toilet: with supervision;regular height toilet;ambulating Pt/caregiver will Perform Home Exercise Program: Increased strength;Both right and left upper extremity;With written HEP provided;With theraband  Plan Discharge plan remains appropriate    Co-evaluation                 AM-PAC OT "6 Clicks" Daily Activity     Outcome Measure   Help from another person eating meals?: A Little Help from another person taking care of personal grooming?: A Little Help from another person toileting, which includes using toliet, bedpan, or urinal?: A Lot Help from another person bathing (including washing, rinsing, drying)?: A Lot Help from another person to put on and taking off regular upper body clothing?: A Little Help from another person to put on and taking off regular lower body clothing?: A Little 6 Click Score: 16    End of Session Equipment Utilized During Treatment: Oxygen(6-8L via HFNC)  OT Visit Diagnosis: Unsteadiness on feet (R26.81);Muscle weakness (generalized) (M62.81);Other abnormalities of gait and mobility (R26.89)   Activity Tolerance Patient tolerated treatment well   Patient Left in  chair;with call bell/phone within reach   Nurse Communication Mobility status        Time: [id number] OT Time Calculation (min): 22 min  Charges: OT General Charges $OT Visit: 1 Visit OT Treatments $Self Care/Home Management : 8-22 mins  Melvin Mills MSOT, OTR/L Acute Rehab Pager: [phone] Office: [phone]   Melvin Mills 04/20/2019, 1:11 PM

## 2019-04-20 NOTE — Progress Notes (Addendum)
Physical Therapy Treatment Patient Details Name: Melvin Mills MRN: [id number] DOB: 11/23/1970 Today's Date: 04/20/2019    History of Present Illness 48 y.o. male admitted on 04/08/19 with fever.  Pt dx with COVID-19.  Pt became progressively hypoxic requiring intubation 04/10/19 attempted extubation 04/12/19, but developed new onset a-fib and respiratory distress and had to be re-intubated on the same day.  Extubated 6/5. Pt with no other listed PMH on file.   Pt also with hypokalemia and hyperglycemia.      PT Comments    Pt making steady progress. Continues to require high levels of supplemental O2 with activity.   Follow Up Recommendations  Home health PT;Supervision for mobility/OOB     Equipment Recommendations  None recommended by PT    Recommendations for Other Services       Precautions / Restrictions Precautions Precautions: Fall Precaution Comments: monitor vitals Restrictions Weight Bearing Restrictions: No    Mobility  Bed Mobility     General bed mobility comments: Pt up in chair  Transfers Overall transfer level: Needs assistance Equipment used: Standard walker Transfers: Sit to/from Stand Sit to Stand: Min guard         General transfer comment: Assist for safety and lines  Ambulation/Gait Ambulation/Gait assistance: Min assist Gait Distance (Feet): 100 Feet Assistive device: 1 person hand held assist Gait Pattern/deviations: Step-through pattern;Decreased stride length Gait velocity: decr Gait velocity interpretation: 1.31 - 2.62 ft/sec, indicative of limited community ambulator General Gait Details: Unsteady gait requiring assist for balance. Amb on 8L of O2 with SpO2 89-93% with amb   Stairs             Wheelchair Mobility    Modified Rankin (Stroke Patients Only)       Balance Overall balance assessment: Needs assistance Sitting-balance support: Feet supported;No upper extremity supported Sitting balance-Leahy Scale:  Good Sitting balance - Comments: sitting at EOB and donning socks   Standing balance support: No upper extremity supported Standing balance-Leahy Scale: Fair                              Cognition Arousal/Alertness: Awake/alert Behavior During Therapy: WFL for tasks assessed/performed Overall Cognitive Status: Within Functional Limits for tasks assessed                                 General Comments: Follow simple commands      Exercises   General Comments      Pertinent Vitals/Pain Pain Assessment: Faces Faces Pain Scale: No hurt Pain Intervention(s): Monitored during session    Home Living                      Prior Function            PT Goals (current goals can now be found in the care plan section) Acute Rehab PT Goals Patient Stated Goal: nodd "yes" to getting stronger  Progress towards PT goals: Progressing toward goals    Frequency    Min 3X/week      PT Plan Current plan remains appropriate;Frequency needs to be updated    Co-evaluation              AM-PAC PT "6 Clicks" Mobility   Outcome Measure  Help needed turning from your back to your side while in a flat bed without using bedrails?: A Little Help needed  moving from lying on your back to sitting on the side of a flat bed without using bedrails?: A Little Help needed moving to and from a bed to a chair (including a wheelchair)?: A Little Help needed standing up from a chair using your arms (e.g., wheelchair or bedside chair)?: A Little Help needed to walk in hospital room?: A Little Help needed climbing 3-5 steps with a railing? : A Little 6 Click Score: 18    End of Session Equipment Utilized During Treatment: Oxygen Activity Tolerance: Patient tolerated treatment well Patient left: in chair;with call bell/phone within reach Nurse Communication: Mobility status PT Visit Diagnosis: Muscle weakness (generalized) (M62.81);Difficulty in walking, not  elsewhere classified (R26.2)     Time: 6803-2122 PT Time Calculation (min) (ACUTE ONLY): 15 min  Charges:  $Gait Training: 8-22 mins                     [place] Pager [phone] Office [place] 04/20/2019, 3:59 PM

## 2019-04-20 NOTE — Progress Notes (Signed)
[facility] Department and AHF Team  ReDS Research Project   Patient #: [id number]  ReDS Measurement  Right: 45%  Left: low quality x 3

## 2019-04-20 NOTE — Plan of Care (Signed)
  Problem: Health Behavior/Discharge Planning: Goal: Ability to manage health-related needs will improve Outcome: Progressing   Problem: Clinical Measurements: Goal: Ability to maintain clinical measurements within normal limits will improve Outcome: Progressing Goal: Will remain free from infection Outcome: Progressing Goal: Diagnostic test results will improve Outcome: Progressing Goal: Respiratory complications will improve Outcome: Progressing Goal: Cardiovascular complication will be avoided Outcome: Progressing   Problem: Activity: Goal: Risk for activity intolerance will decrease Outcome: Progressing   Problem: Nutrition: Goal: Adequate nutrition will be maintained Outcome: Progressing   Problem: Coping: Goal: Level of anxiety will decrease Outcome: Progressing   Problem: Education: Goal: Knowledge of risk factors and measures for prevention of condition will improve Outcome: Progressing   Problem: Coping: Goal: Psychosocial and spiritual needs will be supported Outcome: Progressing   Problem: Respiratory: Goal: Will maintain a patent airway Outcome: Progressing Goal: Complications related to the disease process, condition or treatment will be avoided or minimized Outcome: Progressing   

## 2019-04-21 ENCOUNTER — Inpatient Hospital Stay (HOSPITAL_COMMUNITY): Payer: BC Managed Care – PPO

## 2019-04-21 DIAGNOSIS — J1289 Other viral pneumonia: Secondary | ICD-10-CM

## 2019-04-21 LAB — COMPREHENSIVE METABOLIC PANEL
ALT: 117 U/L — ABNORMAL HIGH (ref 0–44)
AST: 126 U/L — ABNORMAL HIGH (ref 15–41)
Albumin: 2.4 g/dL — ABNORMAL LOW (ref 3.5–5.0)
Alkaline Phosphatase: 59 U/L (ref 38–126)
Anion gap: 10 (ref 5–15)
BUN: 22 mg/dL — ABNORMAL HIGH (ref 6–20)
CO2: 26 mmol/L (ref 22–32)
Calcium: 8.4 mg/dL — ABNORMAL LOW (ref 8.9–10.3)
Chloride: 97 mmol/L — ABNORMAL LOW (ref 98–111)
Creatinine, Ser: 0.92 mg/dL (ref 0.61–1.24)
GFR calc Af Amer: >60 mL/min (ref >60)
GFR calc non Af Amer: >60 mL/min (ref >60)
Glucose, Bld: 90 mg/dL (ref 70–99)
Potassium: 3.8 mmol/L (ref 3.5–5.1)
Sodium: 133 mmol/L — ABNORMAL LOW (ref 135–145)
Total Bilirubin: 0.7 mg/dL (ref 0.3–1.2)
Total Protein: 6.3 g/dL — ABNORMAL LOW (ref 6.5–8.1)

## 2019-04-21 LAB — GLUCOSE, CAPILLARY
Glucose-Capillary: 108 mg/dL — ABNORMAL HIGH (ref 70–99)
Glucose-Capillary: 111 mg/dL — ABNORMAL HIGH (ref 70–99)
Glucose-Capillary: 88 mg/dL (ref 70–99)
Glucose-Capillary: 93 mg/dL (ref 70–99)
Glucose-Capillary: 230 mg/dL — ABNORMAL HIGH (ref 70–99)
Glucose-Capillary: 171 mg/dL — ABNORMAL HIGH (ref 70–99)
Glucose-Capillary: 156 mg/dL — ABNORMAL HIGH (ref 70–99)

## 2019-04-21 LAB — C-REACTIVE PROTEIN: CRP: 8.4 mg/dL — ABNORMAL HIGH (ref <1.0)

## 2019-04-21 LAB — CBC
HCT: 38.5 % — ABNORMAL LOW (ref 39.0–52.0)
Hemoglobin: 12.5 g/dL — ABNORMAL LOW (ref 13.0–17.0)
MCH: 27.4 pg (ref 26.0–34.0)
MCHC: 32.5 g/dL (ref 30.0–36.0)
MCV: 84.2 fL (ref 80.0–100.0)
Platelets: 203 10*3/uL (ref 150–400)
RBC: 4.57 MIL/uL (ref 4.22–5.81)
RDW: 13 % (ref 11.5–15.5)
WBC: 7.6 10*3/uL (ref 4.0–10.5)
nRBC: 0 % (ref 0.0–0.2)

## 2019-04-21 LAB — PROCALCITONIN: Procalcitonin: 0.36 ng/mL

## 2019-04-21 MED ORDER — INSULIN ASPART 100 UNIT/ML ~~LOC~~ SOLN
0.0000 [IU] | Freq: Three times a day (TID) | SUBCUTANEOUS | Status: DC
  Administered 2019-04-21: 3 [IU] via SUBCUTANEOUS
  Administered 2019-04-21: 5 [IU] via SUBCUTANEOUS
  Administered 2019-04-22: 3 [IU] via SUBCUTANEOUS
  Administered 2019-04-23: 2 [IU] via SUBCUTANEOUS
  Administered 2019-04-23: 3 [IU] via SUBCUTANEOUS
  Administered 2019-04-24: 2 [IU] via SUBCUTANEOUS
  Administered 2019-04-24: 12:00:00 3 [IU] via SUBCUTANEOUS
  Administered 2019-04-26: 2 [IU] via SUBCUTANEOUS
  Administered 2019-04-26: 3 [IU] via SUBCUTANEOUS
  Administered 2019-04-27: 2 [IU] via SUBCUTANEOUS
  Administered 2019-04-27: 18:00:00 3 [IU] via SUBCUTANEOUS
  Administered 2019-04-28: 2 [IU] via SUBCUTANEOUS

## 2019-04-21 MED ORDER — FUROSEMIDE 10 MG/ML IJ SOLN
40.0000 mg | Freq: Once | INTRAMUSCULAR | Status: AC
  Administered 2019-04-21: 40 mg via INTRAVENOUS
  Filled 2019-04-21: qty 4

## 2019-04-21 NOTE — Progress Notes (Signed)
[facility] Department and AHF Team  ReDS Research Project   Patient #: [id number]  ReDS Measurement  Right: 43%  Left: 47%

## 2019-04-21 NOTE — Progress Notes (Signed)
Pt. Has been ambulating to the bathroom with walker and tolerating ambulation well. He has been sitting up in chair for most of the afternoon. He has tolerated sitting up well. His saturation remains in the low 90's and his breathing is unlabored. Will continue to monitor patient.

## 2019-04-21 NOTE — Progress Notes (Signed)
Spoke with patient's niece, Hoy Register. Gave her updates on patient condition and answered questions about patient's respiratory status. Answered her concerns about patient's comfort and medications he was receiving on my shift. Told her to call with any questions she may have regarding patient condition.

## 2019-04-21 NOTE — Progress Notes (Signed)
PROGRESS NOTE    Melvin Mills DOB: 10/29/1971 DOA: 04/08/2019 PCP: Grayce SessionsEdwards, [name], NP    Brief Narrative:   Melvin Mills is a 48 y.o. M with no significant PMHx who presented with 1 week fever, SOB, productive cough.  In the ER, CXR showed bilateral infiltrates.  SARS-CoV-2 NAA positive.  Admitted to floor. Progressively hypoxic 5/30 requiring intubation.  Extubated 6/1, developed respiratory distress and new onset Afib and was reintubated.   Subjective:  Melvin Mills was able to interpret our conversation.  Patient continues to have difficulty breathing.  He has occasional cough.  Denies any chest pain.  No nausea vomiting.    Assessment & Plan:  Acute hypoxic respiratory failure due to COVID-19/viral pneumonia  Continues to have high oxygen requirement.  He is on high flow nasal cannula at 10 L/min.  Desaturates even with minimal exertion.  However he appears to be comfortable at rest.  Chest x-ray was repeated which appears to show stable findings but could also suggest some edema.  We will give him Lasix.  Continue incentive spirometry.  Prone positioning as much as possible.  Mobilization as much as possible.  - S/p convalescent plasma 6/1.   - S/p Azithro and ceftriaxone x5d. - S/p remdesivir 5d 5/30 to 6/3  Melvin inflammatory markers had improved.  It appears that he was not considered a candidate for Actemra due to abnormal LFTs.  Continue enoxaparin.  Patient's respiratory status remains tenuous.  Brief episode of paroxysmal atrial fibrillation  This occurred in the intensive care unit after he was extubated the first time.  Presumably due to respiratory distress.  Has not had any further episodes since then.  Melvin chads 2 vascular score is 0.  He remains in sinus rhythm.  He was noted to have low TSH with normal T4.  Will recommend repeating thyroid function tests in a few weeks.  Continue aspirin and metoprolol.  Echocardiogram in the outpatient setting.     Hypokalemia/Sodium abnormalities Potassium was repleted. Was initially hypernatremic and then subsequently mild hyponatremia was noted. Sodium level is stable  Hyperglycemia HbA1c 6.2.  Lantus was discontinued due to low blood glucose levels.  Continue SSI only.     Transaminitis Resolved  Urinary retention Appears to have resolved.  Foley catheter was discontinued.  Flomax.  Bladder scan PRN.    DVT prophylaxis: Lovenox Code Status: FULL Family communication: Discussed with Melvin Mills Disposition: Patient continues to require high doses of oxygen.  Continue management as outlined above.  Lasix x1 today.    Consultants:   PCCM  Procedures:   5/30 ETT  6/1 extubation  6/1 ETT x2 (reintubation)  6/5 extubation  Antimicrobials:   Azithromycin 5/29>> 6/1  Ceftriaxone 5/28>> 6/1  Remdesivir 5/30 >> 6/3  Culture data:   5/28 blood culture x1 -- NG  5/30 respiratory culture --NG  MRSA nares negative     Objective: Vitals:   04/21/19 0300 04/21/19 0400 04/21/19 0500 04/21/19 0902  BP: 101/68 106/76 100/65   Pulse: 94 97 77   Resp: (!) 34 (!) 39 (!) 27   Temp:  99.7 F (37.6 C)  97.9 F (36.6 C)  TempSrc:  Oral  Oral  SpO2: 91% (!) 89% 93%   Weight:      Height:        Intake/Output Summary (Last 24 hours) at 04/21/2019 1124 Last data filed at 04/21/2019 0400 Gross per 24 hour  Intake 360 ml  Output 1350 ml  Net -990 ml   Filed Weights   04/11/19 0600 04/12/19 0500 04/13/19 0600  Weight: 73.6 kg 75.6 kg 74.9 kg    Examination:  General appearance: Awake alert.  In no distress Resp: Diminished air entry at the bases with few crackles.  No wheezing or rhonchi.  Mildly tachypneic at rest. no use of accessory muscles. Cardio: S1-S2 is normal regular.  No S3-S4.  No rubs murmurs or bruit GI: Abdomen is soft.  Nontender nondistended.  Bowel sounds are present normal.  No masses organomegaly Extremities: No edema.  Full range of motion of lower  extremities. Neurologic: Alert and oriented x3.  No focal neurological deficits.    Data Reviewed: I have personally reviewed following labs and imaging studies:  CBC: Recent Labs  Lab 04/17/19 0259 04/18/19 0524 04/19/19 0344 04/20/19 0257 04/21/19 0317  WBC 8.0 5.3 5.4 5.9 7.6  HGB 16.3 14.2 13.3 12.6* 12.5*  HCT 53.4* 44.5 42.8 40.3 38.5*  MCV 86.4 84.8 84.3 84.1 84.2  PLT 234 216 195 208 203   Basic Metabolic Panel: Recent Labs  Lab 04/16/19 0550 04/17/19 0259 04/18/19 0524 04/19/19 0344 04/20/19 0257 04/21/19 0317  NA 145 150* 140 136 132* 133*  K 4.2 4.3 3.9 3.8 3.5 3.8  CL 104 106 99 98 95* 97*  CO2 32 31 30 27 28 26   GLUCOSE 241* 102* 68* 88 99 90  BUN 49* 62* 44* 37* 30* 22*  CREATININE 1.09 1.27* 1.02 0.92 0.88 0.92  CALCIUM 8.8* 9.3 8.7* 8.5* 8.1* 8.4*  MG 2.7*  --   --   --   --   --    GFR: Estimated Creatinine Clearance: 87.1 mL/min (by C-G formula based on SCr of 0.92 mg/dL). Liver Function Tests: Recent Labs  Lab 04/17/19 0259 04/18/19 0524 04/19/19 0344 04/20/19 0257 04/21/19 0317  AST 22 27 32 39 126*  ALT 45* 32 28 33 117*  ALKPHOS 75 59 52 49 59  BILITOT 1.1 0.9 0.6 0.4 0.7  PROT 7.9 6.7 6.4* 6.3* 6.3*  ALBUMIN 2.9* 2.4* 2.4* 2.3* 2.4*   CBG: Recent Labs  Lab 04/20/19 2034 04/21/19 0020 04/21/19 0401 04/21/19 0415 04/21/19 0821  GLUCAP 137* 93 108* 111* 88   Anemia Panel: Recent Labs    04/19/19 0344 04/20/19 0257  FERRITIN 637* 526*    Recent Results (from the past 240 hour(s))  MRSA PCR Screening     Status: None   Collection Time: 04/13/19  1:26 PM  Result Value Ref Range Status   MRSA by PCR NEGATIVE NEGATIVE Final    Comment:        The GeneXpert MRSA Assay (FDA approved for NASAL specimens only), is one component of a comprehensive MRSA colonization surveillance program. It is not intended to diagnose MRSA infection nor to guide or monitor treatment for MRSA infections. Performed at [facility], [address] [place], [place] [address]          Radiology Studies: No results found.   Scheduled Meds: . aspirin  325 mg Oral Daily  . Chlorhexidine Gluconate Cloth  6 each Topical Daily  . dextromethorphan  30 mg Oral BID   And  . guaiFENesin  15 mL Oral Q6H  . enoxaparin (LOVENOX) injection  40 mg Subcutaneous Q12H  . feeding supplement (ENSURE ENLIVE)  237 mL Oral BID BM  . insulin aspart  0-9 Units Subcutaneous Q4H  . insulin aspart  3 Units Subcutaneous Q4H  . Ipratropium-Albuterol  1 puff Inhalation Q6H WA  . mouth rinse  15 mL Mouth Rinse BID  . metoprolol tartrate  12.5 mg Oral BID  . multivitamin  15 mL Oral Daily  . sodium chloride flush  3 mL Intravenous Q12H  . tamsulosin  0.4 mg Oral QPC breakfast   Continuous Infusions: . sodium chloride Stopped (04/16/19 2015)     LOS: 13 days   Bonnielee Haff, MD [facility] 04/21/2019, 11:24 AM     Please page through [place]:  [email or web address] Password [email or web address] If 7PM-7AM, please contact night-coverage

## 2019-04-21 NOTE — Progress Notes (Signed)
Physical Therapy Treatment Patient Details Name: Melvin Mills MRN: [id number] DOB: [birth year] Today's Date: 04/21/2019    History of Present Illness 48 y.o. male admitted on 04/08/19 with fever.  Pt dx with COVID-19.  Pt became progressively hypoxic requiring intubation 04/10/19 attempted extubation 04/12/19, but developed new onset a-fib and respiratory distress and had to be re-intubated on the same day.  Extubated 6/5. Pt with no other listed PMH on file.   Pt also with hypokalemia and hyperglycemia.      PT Comments    Kept pt in room for gait as he is on HFNC at 8L.  He desats quickly with RR up to mid to upper 40s, O2 down to low 80s on 8L O2 HFNC with (+) nasal flare and increased belly breathing.  He recovers after ~5 mins of seated rest.  Repeated trip to the door and back with RW for stability x 3.  PT will continue to follow acutely for safe mobility progression   Follow Up Recommendations  Home health PT;Supervision for mobility/OOB     Equipment Recommendations  None recommended by PT    Recommendations for Other Services   NA     Precautions / Restrictions Precautions Precautions: Fall Precaution Comments: monitor vitals    Mobility  Bed Mobility               General bed mobility comments: Pt was OOB in the recliner chair.   Transfers Overall transfer level: Needs assistance Equipment used: Rolling walker (2 wheeled) Transfers: Sit to/from Stand Sit to Stand: Min guard         General transfer comment: Min guard assist for safety, cues for safe hand placement.   Ambulation/Gait Ambulation/Gait assistance: Min assist Gait Distance (Feet): 60 Feet(20'x3) Assistive device: Rolling walker (2 wheeled) Gait Pattern/deviations: Step-through pattern;Shuffle Gait velocity: decreased Gait velocity interpretation: 1.31 - 2.62 ft/sec, indicative of limited community ambulator General Gait Details: Pt with slow, shuffling, buckling knee gait pattern, DOE  increases immediately upon standing on 8 L O2 [place], RR in the mid to upper 40s and O2 sats decreased to low 80s during short distance gait to the door and back to the chair.  ~5 mins of recovery before we could do it again x 3 trips total.            Balance Overall balance assessment: Needs assistance Sitting-balance support: Feet supported;No upper extremity supported Sitting balance-Leahy Scale: Good     Standing balance support: No upper extremity supported;Bilateral upper extremity supported;Single extremity supported Standing balance-Leahy Scale: Poor Standing balance comment: needs support from RW and min guard from therapist.                             Cognition Arousal/Alertness: Awake/alert Behavior During Therapy: Flat affect                                   General Comments: Follows simple commands, but does not say much, or vocalize much at all.  Not sure if it is the language barrier, pain from multiple intubations, cognition, etc.              Pertinent Vitals/Pain Pain Assessment: Faces Faces Pain Scale: No hurt           PT Goals (current goals can now be found in the care plan section) Acute Rehab PT Goals  Patient Stated Goal: none stated Progress towards PT goals: Progressing toward goals    Frequency    Min 3X/week      PT Plan Current plan remains appropriate       AM-PAC PT "6 Clicks" Mobility   Outcome Measure  Help needed turning from your back to your side while in a flat bed without using bedrails?: A Little Help needed moving from lying on your back to sitting on the side of a flat bed without using bedrails?: A Little Help needed moving to and from a bed to a chair (including a wheelchair)?: A Little Help needed standing up from a chair using your arms (e.g., wheelchair or bedside chair)?: A Little Help needed to walk in hospital room?: A Little Help needed climbing 3-5 steps with a railing? : A  Little 6 Click Score: 18    End of Session Equipment Utilized During Treatment: Oxygen(8 L O2 HFNC) Activity Tolerance: Patient limited by fatigue Patient left: in chair;with call bell/phone within reach   PT Visit Diagnosis: Muscle weakness (generalized) (M62.81);Difficulty in walking, not elsewhere classified (R26.2)     Time: 0981-19141353-1422 PT Time Calculation (min) (ACUTE ONLY): 29 min  Charges:  $Gait Training: 23-37 mins                    [place]on Kiel B. Deauna Yaw, PT, DPT  Acute Rehabilitation [phone] pager [phone] office

## 2019-04-22 LAB — CBC
HCT: 39.2 % (ref 39.0–52.0)
Hemoglobin: 12.3 g/dL — ABNORMAL LOW (ref 13.0–17.0)
MCH: 26.4 pg (ref 26.0–34.0)
MCHC: 31.4 g/dL (ref 30.0–36.0)
MCV: 84.1 fL (ref 80.0–100.0)
Platelets: 227 10*3/uL (ref 150–400)
RBC: 4.66 MIL/uL (ref 4.22–5.81)
RDW: 13 % (ref 11.5–15.5)
WBC: 10.4 10*3/uL (ref 4.0–10.5)
nRBC: 0 % (ref 0.0–0.2)

## 2019-04-22 LAB — COMPREHENSIVE METABOLIC PANEL
ALT: 132 U/L — ABNORMAL HIGH (ref 0–44)
AST: 79 U/L — ABNORMAL HIGH (ref 15–41)
Albumin: 2.5 g/dL — ABNORMAL LOW (ref 3.5–5.0)
Alkaline Phosphatase: 70 U/L (ref 38–126)
Anion gap: 10 (ref 5–15)
BUN: 20 mg/dL (ref 6–20)
CO2: 28 mmol/L (ref 22–32)
Calcium: 8.5 mg/dL — ABNORMAL LOW (ref 8.9–10.3)
Chloride: 96 mmol/L — ABNORMAL LOW (ref 98–111)
Creatinine, Ser: 0.9 mg/dL (ref 0.61–1.24)
GFR calc Af Amer: >60 mL/min (ref >60)
GFR calc non Af Amer: >60 mL/min (ref >60)
Glucose, Bld: 108 mg/dL — ABNORMAL HIGH (ref 70–99)
Potassium: 3.7 mmol/L (ref 3.5–5.1)
Sodium: 134 mmol/L — ABNORMAL LOW (ref 135–145)
Total Bilirubin: 0.6 mg/dL (ref 0.3–1.2)
Total Protein: 6.6 g/dL (ref 6.5–8.1)

## 2019-04-22 LAB — GLUCOSE, CAPILLARY
Glucose-Capillary: 119 mg/dL — ABNORMAL HIGH (ref 70–99)
Glucose-Capillary: 199 mg/dL — ABNORMAL HIGH (ref 70–99)
Glucose-Capillary: 112 mg/dL — ABNORMAL HIGH (ref 70–99)
Glucose-Capillary: 178 mg/dL — ABNORMAL HIGH (ref 70–99)

## 2019-04-22 LAB — C-REACTIVE PROTEIN: CRP: 8.4 mg/dL — ABNORMAL HIGH (ref <1.0)

## 2019-04-22 MED ORDER — POTASSIUM CHLORIDE CRYS ER 20 MEQ PO TBCR
40.0000 meq | EXTENDED_RELEASE_TABLET | Freq: Once | ORAL | Status: AC
  Administered 2019-04-22: 14:00:00 40 meq via ORAL
  Filled 2019-04-22: qty 2

## 2019-04-22 MED ORDER — FUROSEMIDE 10 MG/ML IJ SOLN
40.0000 mg | Freq: Once | INTRAMUSCULAR | Status: AC
  Administered 2019-04-22: 40 mg via INTRAVENOUS
  Filled 2019-04-22: qty 4

## 2019-04-22 NOTE — Progress Notes (Signed)
Educated and encouraged patient to prone, he refuses at this time. Educated and encouraged for incentive spirometry, able to reach 500

## 2019-04-22 NOTE — Progress Notes (Signed)
[facility] Department and AHF Team  ReDS Research Project   Patient #: [id number]  ReDS Measurement  Right: 44%  Left: low quality

## 2019-04-22 NOTE — Progress Notes (Signed)
Occupational Therapy Treatment Patient Details Name: Melvin Mills MRN: [id number] DOB: December 07, 1970 Today's Date: 04/22/2019    History of present illness 48 y.o. male admitted on 04/08/19 with fever.  Pt dx with COVID-19.  Pt became progressively hypoxic requiring intubation 04/10/19 attempted extubation 04/12/19, but developed new onset a-fib and respiratory distress and had to be re-intubated on the same day.  Extubated 6/5. Pt with no other listed PMH on file.   Pt also with hypokalemia and hyperglycemia.     OT comments  [employer] Interpreter Simona Huh [phone] used during session. Pt states he feels very weak. Pt appears to be having more difficulty with his respiratory status this pm. Pt tachypnic with RR into the 50s; desat into 70's and HR 126 when moving to EOB on 8L HFNC. Nsg present and Pt eventually increased to 15 L with SpO2 88; RR in the 40s and HR 116. Pt reports feeling short of breath; appeared to become more anxious due to his shortness of breath. Once engaged in functional activity (brushing his teeth), RR improved. With use of interpreter, pt educated on why it was important to prone and was agreeable to prone @ 1600. Nsg made aware. Pt left on 6L in prone with SpO2 94; HR 94 and RR 39. Pt appreciative. Will continue to follow acutely.   Follow Up Recommendations  Home health OT;Supervision/Assistance - 24 hour    Equipment Recommendations  3 in 1 bedside commode    Recommendations for Other Services      Precautions / Restrictions Precautions Precautions: Fall Precaution Comments: monitor vitals       Mobility Bed Mobility Overal bed mobility: Needs Assistance     Sidelying to sit: Min assist to transition to upright due to SOB/weakness       General bed mobility comments: assisted into prone position  Transfers                 General transfer comment: not completed due to respiratory status    Balance     Sitting balance-Leahy Scale: Good                                      ADL either performed or assessed with clinical judgement   ADL Overall ADL's : Needs assistance/impaired     Grooming: Set up;Sitting;Oral care;Wash/dry face;Wash/dry hands               Lower Body Dressing: Minimal assistance;Sit to/from stand                 General ADL Comments: ADL completed sitting EOB due to tachypnea; unable to ambulate at this time     Vision       Perception     Praxis      Cognition Arousal/Alertness: Awake/alert Behavior During Therapy: Flat affect Overall Cognitive Status: Within Functional Limits for tasks assessed                                          Exercises Other Exercises Other Exercises: incentive spirometer - able to pull 150 ml   Shoulder Instructions       General Comments      Pertinent Vitals/ Pain       Pain Assessment: Faces Faces Pain Scale: Hurts little more Pain Location: chest  tightness Pain Descriptors / Indicators: Discomfort Pain Intervention(s): Limited activity within patient's tolerance  Home Living                                          Prior Functioning/Environment              Frequency  Min 3X/week        Progress Toward Goals  OT Goals(current goals can now be found in the care plan section)  Progress towards OT goals: Progressing toward goals  Acute Rehab OT Goals Patient Stated Goal: to get better OT Goal Formulation: With patient Time For Goal Achievement: 04/29/19 Potential to Achieve Goals: Good ADL Goals Pt Will Perform Upper Body Bathing: with set-up;sitting Pt Will Perform Lower Body Bathing: with min guard assist;sit to/from stand Pt Will Perform Upper Body Dressing: with set-up;sitting Pt Will Perform Lower Body Dressing: with min guard assist;sit to/from stand Pt Will Transfer to Toilet: with supervision;regular height toilet;ambulating Pt/caregiver will Perform Home Exercise  Program: Increased strength;Both right and left upper extremity;With written HEP provided;With theraband  Plan Discharge plan remains appropriate    Co-evaluation                 AM-PAC OT "6 Clicks" Daily Activity     Outcome Measure   Help from another person eating meals?: None Help from another person taking care of personal grooming?: A Little Help from another person toileting, which includes using toliet, bedpan, or urinal?: A Little Help from another person bathing (including washing, rinsing, drying)?: A Lot Help from another person to put on and taking off regular upper body clothing?: A Little Help from another person to put on and taking off regular lower body clothing?: A Little 6 Click Score: 18    End of Session Equipment Utilized During Treatment: Oxygen(6-15 L)  OT Visit Diagnosis: Unsteadiness on feet (R26.81);Muscle weakness (generalized) (M62.81);Other abnormalities of gait and mobility (R26.89)   Activity Tolerance Other (comment)(limited by tachnypnea)   Patient Left in bed;with call bell/phone within reach(prone position)   Nurse Communication Mobility status;Other (comment)(respiratory status)        Time: 1610-96041522-1607 OT Time Calculation (min): 45 min  Charges: OT General Charges $OT Visit: 1 Visit OT Treatments $Self Care/Home Management : 38-52 mins  Luisa DagoHilary Jasiya Markie, OT/L   Acute OT Clinical Specialist Acute Rehabilitation Services Pager [phone] Office [phone]    [facility]ILL[facility] 04/22/2019, 5:45 PM

## 2019-04-22 NOTE — Progress Notes (Signed)
PROGRESS NOTE    Melvin Mills  ZOX:[id number] DOB: 11/21/1970 DOA: 04/08/2019 PCP: Grayce SessionsEdwards, [name], NP    Brief Narrative:   Melvin Mills is a 48 y.o. M with no significant PMHx who presented with 1 week fever, SOB, productive cough.  In the ER, CXR showed bilateral infiltrates.  SARS-CoV-2 NAA positive.  Admitted to floor. Progressively hypoxic 5/30 requiring intubation.  Extubated 6/1, developed respiratory distress and new onset Afib and was reintubated.   Subjective:  Patient states that he is feeling slightly better today.  Still continues to have difficulty breathing with even minimal exertion.  Does seem to be a little bit more comfortable today compared to yesterday.     Assessment & Plan:  Acute hypoxic respiratory failure due to COVID-19/viral pneumonia  Patient appears to be more comfortable today compared to yesterday.  Saturations appear to have improved.  Still on 6 to 7 L of oxygen by high flow nasal cannula.  Chest x-ray repeated yesterday showed some edema.  He was given Lasix with good diuresis.  We will repeat another dose today.  Prone positioning as much as possible.  Incentive spirometry.  Mobilization.    - S/p convalescent plasma 6/1.   - S/p Azithro and ceftriaxone x5d. - S/p remdesivir 5d 5/30 to 6/3  It appears that he was not considered a candidate for Actemra due to abnormal LFTs.  Continue enoxaparin.  Patient's respiratory status remains stable but tenuous.  Brief episode of paroxysmal atrial fibrillation  This occurred in the intensive care unit after he was extubated the first time.  Presumably due to respiratory distress.  Has not had any further episodes since then.  His chads 2 vascular score is 0.  He remains in sinus rhythm.  He was noted to have low TSH with normal T4.  Will recommend repeating thyroid function tests in a few weeks.  Continue aspirin and metoprolol.  Echocardiogram in the outpatient setting.    Hypokalemia/Sodium  abnormalities Potassium was repleted. Was initially hypernatremic and then subsequently mild hyponatremia was noted. Sodium level is stable  Hyperglycemia HbA1c 6.2.  Lantus was discontinued due to low blood glucose levels.  Continue SSI only.     Transaminitis Mildly abnormal LFTs noted.  Monitor periodically.  Urinary retention Appears to have resolved.  Foley catheter was discontinued.  Flomax.  Bladder scan PRN.    DVT prophylaxis: Lovenox Code Status: FULL Family communication: We will discuss with his niece today. Disposition: Continue to mobilize.  Not ready for discharge yet as he is still on high doses of oxygen.     Consultants:   PCCM  Procedures:   5/30 ETT  6/1 extubation  6/1 ETT x2 (reintubation)  6/5 extubation  Antimicrobials:   Azithromycin 5/29>> 6/1  Ceftriaxone 5/28>> 6/1  Remdesivir 5/30 >> 6/3  Culture data:   5/28 blood culture x1 -- NG  5/30 respiratory culture --NG  MRSA nares negative     Objective: Vitals:   04/22/19 0405 04/22/19 0604 04/22/19 0808 04/22/19 0810  BP: 104/73   107/74  Pulse: 94   91  Resp: (!) 27   (!) 35  Temp:  98.5 F (36.9 C) 98.4 F (36.9 C)   TempSrc:  Oral Oral   SpO2: 94%   95%  Weight:  42.2 kg    Height:        Intake/Output Summary (Last 24 hours) at 04/22/2019 1111 Last data filed at 04/21/2019 1357 Gross per 24 hour  Intake  360 ml  Output 800 ml  Net -440 ml   Filed Weights   04/13/19 0600 04/21/19 1605 04/22/19 0604  Weight: 74.9 kg 65.1 kg 42.2 kg    Examination:  General appearance: Awake alert.  In no distress Resp: Coarse breath sounds bilaterally.  Diminished at the bases with few crackles.  No wheezing or rhonchi.  Less tachypneic today compared to yesterday.   Cardio: S1-S2 is normal regular.  No S3-S4.  No rubs murmurs or bruit GI: Abdomen is soft.  Nontender nondistended.  Bowel sounds are present normal.  No masses organomegaly Extremities: No edema.  Full range of  motion of lower extremities. Neurologic: Alert and oriented x3.  No focal neurological deficits.    Data Reviewed: I have personally reviewed following labs and imaging studies:  CBC: Recent Labs  Lab 04/18/19 0524 04/19/19 0344 04/20/19 0257 04/21/19 0317 04/22/19 0400  WBC 5.3 5.4 5.9 7.6 10.4  HGB 14.2 13.3 12.6* 12.5* 12.3*  HCT 44.5 42.8 40.3 38.5* 39.2  MCV 84.8 84.3 84.1 84.2 84.1  PLT 216 195 208 203 227   Basic Metabolic Panel: Recent Labs  Lab 04/16/19 0550  04/18/19 0524 04/19/19 0344 04/20/19 0257 04/21/19 0317 04/22/19 0400  NA 145   < > 140 136 132* 133* 134*  K 4.2   < > 3.9 3.8 3.5 3.8 3.7  CL 104   < > 99 98 95* 97* 96*  CO2 32   < > 30 27 28 26 28   GLUCOSE 241*   < > 68* 88 99 90 108*  BUN 49*   < > 44* 37* 30* 22* 20  CREATININE 1.09   < > 1.02 0.92 0.88 0.92 0.90  CALCIUM 8.8*   < > 8.7* 8.5* 8.1* 8.4* 8.5*  MG 2.7*  --   --   --   --   --   --    < > = values in this interval not displayed.   GFR: Estimated Creatinine Clearance: 58.6 mL/min (by C-G formula based on SCr of 0.9 mg/dL). Liver Function Tests: Recent Labs  Lab 04/18/19 0524 04/19/19 0344 04/20/19 0257 04/21/19 0317 04/22/19 0400  AST 27 32 39 126* 79*  ALT 32 28 33 117* 132*  ALKPHOS 59 52 49 59 70  BILITOT 0.9 0.6 0.4 0.7 0.6  PROT 6.7 6.4* 6.3* 6.3* 6.6  ALBUMIN 2.4* 2.4* 2.3* 2.4* 2.5*   CBG: Recent Labs  Lab 04/21/19 0821 04/21/19 1122 04/21/19 1600 04/21/19 1947 04/22/19 0807  GLUCAP 88 230* 171* 156* 119*   Anemia Panel: Recent Labs    04/20/19 0257  FERRITIN 526*    Recent Results (from the past 240 hour(s))  MRSA PCR Screening     Status: None   Collection Time: 04/13/19  1:26 PM   Specimen: Nasopharyngeal  Result Value Ref Range Status   MRSA by PCR NEGATIVE NEGATIVE Final    Comment:        The GeneXpert MRSA Assay (FDA approved for NASAL specimens only), is one component of a comprehensive MRSA colonization surveillance program. It is not  intended to diagnose MRSA infection nor to guide or monitor treatment for MRSA infections. Performed at [facility], [address] [place], [place] [address]          Radiology Studies: Dg Chest Port 1 View  Result Date: 04/21/2019 CLINICAL DATA:  COVID-19 pneumonia EXAM: PORTABLE CHEST 1 VIEW COMPARISON:  04/12/2019 FINDINGS: Cardiac shadows within normal limits. The  endotracheal tube is been removed. Diffuse bilateral opacities are noted consistent with the patient's given clinical history. The overall appearance is similar to that seen on the prior exam. No bony abnormality is noted. IMPRESSION: Stable multifocal infiltrate consistent with the patient's given clinical history. Electronically Signed   By: Inez Catalina M.D.   On: 04/21/2019 12:44     Scheduled Meds: . aspirin  325 mg Oral Daily  . Chlorhexidine Gluconate Cloth  6 each Topical Daily  . dextromethorphan  30 mg Oral BID   And  . guaiFENesin  15 mL Oral Q6H  . enoxaparin (LOVENOX) injection  40 mg Subcutaneous Q12H  . feeding supplement (ENSURE ENLIVE)  237 mL Oral BID BM  . insulin aspart  0-15 Units Subcutaneous TID WC  . Ipratropium-Albuterol  1 puff Inhalation Q6H WA  . mouth rinse  15 mL Mouth Rinse BID  . metoprolol tartrate  12.5 mg Oral BID  . multivitamin  15 mL Oral Daily  . sodium chloride flush  3 mL Intravenous Q12H  . tamsulosin  0.4 mg Oral QPC breakfast   Continuous Infusions: . sodium chloride Stopped (04/16/19 2015)     LOS: 14 days   Bonnielee Haff, MD [facility] 04/22/2019, 11:11 AM     Please page through [facility]:  [email or web address] Password [email or web address] If 7PM-7AM, please contact night-coverage

## 2019-04-23 LAB — CBC
HCT: 39.8 % (ref 39.0–52.0)
Hemoglobin: 12.8 g/dL — ABNORMAL LOW (ref 13.0–17.0)
MCH: 27.1 pg (ref 26.0–34.0)
MCHC: 32.2 g/dL (ref 30.0–36.0)
MCV: 84.1 fL (ref 80.0–100.0)
Platelets: 252 10*3/uL (ref 150–400)
RBC: 4.73 MIL/uL (ref 4.22–5.81)
RDW: 13 % (ref 11.5–15.5)
WBC: 12.6 10*3/uL — ABNORMAL HIGH (ref 4.0–10.5)
nRBC: 0 % (ref 0.0–0.2)

## 2019-04-23 LAB — COMPREHENSIVE METABOLIC PANEL
ALT: 121 U/L — ABNORMAL HIGH (ref 0–44)
AST: 61 U/L — ABNORMAL HIGH (ref 15–41)
Albumin: 2.6 g/dL — ABNORMAL LOW (ref 3.5–5.0)
Alkaline Phosphatase: 82 U/L (ref 38–126)
Anion gap: 14 (ref 5–15)
BUN: 26 mg/dL — ABNORMAL HIGH (ref 6–20)
CO2: 26 mmol/L (ref 22–32)
Calcium: 8.5 mg/dL — ABNORMAL LOW (ref 8.9–10.3)
Chloride: 95 mmol/L — ABNORMAL LOW (ref 98–111)
Creatinine, Ser: 0.94 mg/dL (ref 0.61–1.24)
GFR calc Af Amer: >60 mL/min (ref >60)
GFR calc non Af Amer: >60 mL/min (ref >60)
Glucose, Bld: 121 mg/dL — ABNORMAL HIGH (ref 70–99)
Potassium: 3.9 mmol/L (ref 3.5–5.1)
Sodium: 135 mmol/L (ref 135–145)
Total Bilirubin: 0.3 mg/dL (ref 0.3–1.2)
Total Protein: 7.1 g/dL (ref 6.5–8.1)

## 2019-04-23 LAB — GLUCOSE, CAPILLARY
Glucose-Capillary: 136 mg/dL — ABNORMAL HIGH (ref 70–99)
Glucose-Capillary: 104 mg/dL — ABNORMAL HIGH (ref 70–99)
Glucose-Capillary: 193 mg/dL — ABNORMAL HIGH (ref 70–99)
Glucose-Capillary: 136 mg/dL — ABNORMAL HIGH (ref 70–99)

## 2019-04-23 LAB — C-REACTIVE PROTEIN: CRP: 8.7 mg/dL — ABNORMAL HIGH (ref <1.0)

## 2019-04-23 MED ORDER — FUROSEMIDE 10 MG/ML IJ SOLN
40.0000 mg | Freq: Once | INTRAMUSCULAR | Status: AC
  Administered 2019-04-23: 40 mg via INTRAVENOUS
  Filled 2019-04-23: qty 4

## 2019-04-23 MED ORDER — POTASSIUM CHLORIDE CRYS ER 20 MEQ PO TBCR
40.0000 meq | EXTENDED_RELEASE_TABLET | Freq: Once | ORAL | Status: AC
  Administered 2019-04-23: 40 meq via ORAL
  Filled 2019-04-23: qty 2

## 2019-04-23 MED ORDER — POLYETHYLENE GLYCOL 3350 17 G PO PACK
17.0000 g | PACK | Freq: Every day | ORAL | Status: DC
  Administered 2019-04-23 – 2019-04-30 (×7): 17 g via ORAL
  Filled 2019-04-23 (×7): qty 1

## 2019-04-23 MED ORDER — SALINE SPRAY 0.65 % NA SOLN
1.0000 | NASAL | Status: DC | PRN
  Administered 2019-04-23: 1 via NASAL
  Filled 2019-04-23: qty 44

## 2019-04-23 NOTE — Progress Notes (Signed)
Physical Therapy Treatment Patient Details Name: Melvin Mills MRN: [id number] DOB: 12/29/1970 Today's Date: 04/23/2019    History of Present Illness 48 y.o. male admitted on 04/08/19 with fever.  Pt dx with COVID-19.  Pt became progressively hypoxic requiring intubation 04/10/19 attempted extubation 04/12/19, but developed new onset a-fib and respiratory distress and had to be re-intubated on the same day.  Extubated 6/5. Pt with no other listed PMH on file.   Pt also with hypokalemia and hyperglycemia.      PT Comments    Pt looks worse than our last session we were only able to walk to the door once and back to the chair.  His WOB is significantly higher despite lower O2 levels.  He recovered his O2 saturation in ~5 mins of seated rest, but his WOB continued for >10 mins.  He reports he is not sleeping and is exhausted.  PT will continue to follow acutely for safe mobility progression  Follow Up Recommendations  Home health PT;Supervision for mobility/OOB     Equipment Recommendations  None recommended by PT    Recommendations for Other Services  NA     Precautions / Restrictions Precautions Precautions: Fall Precaution Comments: monitor vitals    Mobility  Bed Mobility Overal bed mobility: Needs Assistance       Supine to sit: Supervision     General bed mobility comments: Pt was OOB in the recliner chair.   Transfers Overall transfer level: Needs assistance Equipment used: Rolling walker (2 wheeled) Transfers: Sit to/from Stand Sit to Stand: Min guard Stand pivot transfers: Min guard       General transfer comment: Min guard assist for safety, used RW for stability.   Ambulation/Gait Ambulation/Gait assistance: Min guard Gait Distance (Feet): 20 Feet Assistive device: Rolling walker (2 wheeled) Gait Pattern/deviations: Step-through pattern;Shuffle;Staggering left;Staggering right     General Gait Details: Pt with slow, staggering gait pattern, used RW for  stability.  Turned O2 up from 8L to 10 L in preparation for gait.  Pt with nasal flaring and increased WOB RR into the low 50s at times, O2 sats down to 70% with short distance gait.  Interestingly, his O2 sat number rebounded in <5 min, but his work of breathing took much much longer to recover >10 mins to get RR in to the 30s again.  He did have anxiety meds before our session.  We could only walk to the door once today compared to three times last session.            Balance Overall balance assessment: Needs assistance Sitting-balance support: Feet supported;No upper extremity supported Sitting balance-Leahy Scale: Good     Standing balance support: Bilateral upper extremity supported Standing balance-Leahy Scale: Poor Standing balance comment: needs support from RW and therapist.                             Cognition Arousal/Alertness: Awake/alert Behavior During Therapy: Flat affect Overall Cognitive Status: Within Functional Limits for tasks assessed                                 General Comments: Used vietnamese interpreter, Mitzi Hansen [phone]      Exercises Other Exercises Other Exercises: incentive spirometer - able to pull 150 ml Other Exercises: sit - stand; marching x 15 steps        Pertinent Vitals/Pain Pain Assessment:  Faces Faces Pain Scale: Hurts even more Pain Location: inner nose (raw and bleeding from HFNC) Pain Descriptors / Indicators: Discomfort Pain Intervention(s): Limited activity within patient's tolerance;Monitored during session;Repositioned;Patient requesting pain meds-RN notified;Other (comment)(requested salene nasal spray)           PT Goals (current goals can now be found in the care plan section) Acute Rehab PT Goals Patient Stated Goal: to get better Progress towards PT goals: Progressing toward goals    Frequency    Min 3X/week      PT Plan Current plan remains appropriate       AM-PAC PT "6  Clicks" Mobility   Outcome Measure  Help needed turning from your back to your side while in a flat bed without using bedrails?: A Little Help needed moving from lying on your back to sitting on the side of a flat bed without using bedrails?: A Little Help needed moving to and from a bed to a chair (including a wheelchair)?: A Little Help needed standing up from a chair using your arms (e.g., wheelchair or bedside chair)?: A Little Help needed to walk in hospital room?: A Little Help needed climbing 3-5 steps with a railing? : A Little 6 Click Score: 18    End of Session Equipment Utilized During Treatment: Oxygen(10L O2 HFNC) Activity Tolerance: Patient limited by fatigue Patient left: in chair;with call bell/phone within reach Nurse Communication: Mobility status PT Visit Diagnosis: Muscle weakness (generalized) (M62.81);Difficulty in walking, not elsewhere classified (R26.2)     Time: [id number] PT Time Calculation (min) (ACUTE ONLY): 29 min  Charges:  $Gait Training: 23-37 mins                    Jametta Moorehead B. Joss Friedel, PT, DPT  Acute Rehabilitation [phone] pager [phone] office  @ [place]: [phone]   04/23/2019, 4:13 PM

## 2019-04-23 NOTE — Progress Notes (Signed)
Spoke with patients family member and gave overall update on his clinical status. Family mbr made  request to possibly drop off a cultural specific meal for patient. Instructed family mbr,  I would speak to supervising team in regards to request but felt this may not be plausible due to infection protocols/ she is agreeable

## 2019-04-23 NOTE — Progress Notes (Signed)
Occupational Therapy Treatment Patient Details Name: Melvin Mills MRN: [id number] DOB: 01/01/1971 Today's Date: 04/23/2019    History of present illness 48 y.o. male admitted on 04/08/19 with fever.  Pt dx with COVID-19.  Pt became progressively hypoxic requiring intubation 04/10/19 attempted extubation 04/12/19, but developed new onset a-fib and respiratory distress and had to be re-intubated on the same day.  Extubated 6/5. Pt with no other listed PMH on file.   Pt also with hypokalemia and hyperglycemia.     OT comments  [employer] Interpreter "An" [phone] used during session. Pt on 6L HFNC in bed. Mobilized to EOB with desat to 85; RR in the 40s; HR 110. O2 increased to 8L. SpO2 to 89-90. Able to complete sit - stand and march in place 15 steps. Desat to 83; tachypnic with HR 126 and RR in the 40s. Over 4 min to rebound to 91. Through discussion with interpreter, pt is short of breath, which makes hiim "very anxious". Pt reports he took "something for anxiety" before getting sick. Relayed information to nsg as anti-anxiety meds may help to progress rehab. Pt very appreciative.   Follow Up Recommendations  Home health OT;Supervision/Assistance - 24 hour    Equipment Recommendations  3 in 1 bedside commode    Recommendations for Other Services PT consult    Precautions / Restrictions Precautions Precautions: Fall Precaution Comments: monitor vitals       Mobility Bed Mobility Overal bed mobility: Needs Assistance       Supine to sit: Supervision        Transfers Overall transfer level: Needs assistance   Transfers: Stand Pivot Transfers;Sit to/from Stand Sit to Stand: Min guard Stand pivot transfers: Min guard            Balance     Sitting balance-Leahy Scale: Good       Standing balance-Leahy Scale: Fair                             ADL either performed or assessed with clinical judgement   ADL Overall ADL's : Needs assistance/impaired      Grooming: Set up;Sitting;Oral care;Wash/dry face;Wash/dry Investment banker, operationalhands                   Toilet Transfer: Min guard Toilet Transfer Details (indicate cue type and reason): sit - stand use of urinal Toileting- Clothing Manipulation and Hygiene: Set up       Functional mobility during ADLs: Min guard General ADL Comments: Mobilized to chair     Vision       Perception     Praxis      Cognition Arousal/Alertness: Awake/alert Behavior During Therapy: Flat affect Overall Cognitive Status: Within Functional Limits for tasks assessed                                          Exercises Other Exercises Other Exercises: incentive spirometer - able to pull 150 ml Other Exercises: sit - stand; marching x 15 steps   Shoulder Instructions       General Comments      Pertinent Vitals/ Pain       Pain Assessment: Faces Faces Pain Scale: Hurts little more Pain Location: chest tightness with breathing Pain Descriptors / Indicators: Discomfort  Home Living  Prior Functioning/Environment              Frequency  Min 3X/week        Progress Toward Goals  OT Goals(current goals can now be found in the care plan section)  Progress towards OT goals: Progressing toward goals  Acute Rehab OT Goals Patient Stated Goal: to get better OT Goal Formulation: With patient Time For Goal Achievement: 04/29/19 Potential to Achieve Goals: Good ADL Goals Pt Will Perform Upper Body Bathing: with set-up;sitting Pt Will Perform Lower Body Bathing: with min guard assist;sit to/from stand Pt Will Perform Upper Body Dressing: with set-up;sitting Pt Will Perform Lower Body Dressing: with min guard assist;sit to/from stand Pt Will Transfer to Toilet: with supervision;regular height toilet;ambulating Pt/caregiver will Perform Home Exercise Program: Increased strength;Both right and left upper extremity;With  written HEP provided;With theraband  Plan Discharge plan remains appropriate    Co-evaluation                 AM-PAC OT "6 Clicks" Daily Activity     Outcome Measure   Help from another person eating meals?: None Help from another person taking care of personal grooming?: A Little Help from another person toileting, which includes using toliet, bedpan, or urinal?: A Little Help from another person bathing (including washing, rinsing, drying)?: A Lot Help from another person to put on and taking off regular upper body clothing?: A Little Help from another person to put on and taking off regular lower body clothing?: A Little 6 Click Score: 18    End of Session Equipment Utilized During Treatment: Oxygen(6-10L)  OT Visit Diagnosis: Unsteadiness on feet (R26.81);Muscle weakness (generalized) (M62.81);Other abnormalities of gait and mobility (R26.89)   Activity Tolerance Patient tolerated treatment well   Patient Left in chair;with call bell/phone within reach   Nurse Communication Mobility status;Other (comment)(use of anti - anxiety med)        Time: 6440-3474 OT Time Calculation (min): 50 min  Charges: OT General Charges $OT Visit: 1 Visit OT Treatments $Self Care/Home Management : 8-22 mins $Therapeutic Activity: 8-22 mins $Therapeutic Exercise: 8-22 mins  Maurie Boettcher, OT/L   Acute OT Clinical Specialist [place] Pager [phone] Office [phone]    [facility] 04/23/2019, 1:47 PM

## 2019-04-23 NOTE — Progress Notes (Addendum)
PROGRESS NOTE    Melvin Mills  ZOX:[id number] DOB: 06/15/1971 DOA: 04/08/2019 PCP: Grayce SessionsEdwards, [name], NP    Brief Narrative:  Melvin Mills is a 48 y.o. M with no significant PMHx who presented with 1 week fever, SOB, productive cough.  In the ER, CXR showed bilateral infiltrates.  SARS-CoV-2 NAA positive.  Admitted to floor. Progressively hypoxic 5/30 requiring intubation.  Extubated 6/1, developed respiratory distress and new onset Afib and was reintubated.   Subjective:  Patient appears to be more comfortable today compared to yesterday.  He states that he is feeling better.  Less cough.  No nausea vomiting.  But based on PT and OT notes he still tends to get quite dyspneic even with minimal exertion.  Assessment & Plan:  Acute hypoxic respiratory failure due to COVID-19/viral pneumonia  Patient appears to be slowly improving.  On high flow nasal cannula but only at 6 L/min now.  Saturating in the 90s.  It appears that the Lasix has helped.  We will repeat another dose today.  Prone positioning as much as possible.  Incentive spirometry.  Mobilization.    - S/p convalescent plasma 6/1.   - S/p Azithro and ceftriaxone x5d. - S/p remdesivir 5d 5/30 to 6/3  It appears that he was not considered a candidate for Actemra due to abnormal LFTs.  Continue enoxaparin.  Patient's respiratory status seems to be stable to slightly improved but still tenuous.  His WBC is noted to be slightly elevated today.  However since clinically patient seems to be improving we will not pursue this at this time.  Brief episode of paroxysmal atrial fibrillation  This occurred in the intensive care unit after he was extubated the first time.  Presumably due to respiratory distress.  Has not had any further episodes since then.  His chads 2 vascular score is 0.  He remains in sinus rhythm.  He was noted to have low TSH with normal T4.  Will recommend repeating thyroid function tests in a few weeks.  Continue  aspirin and metoprolol.  Echocardiogram in the outpatient setting.    Hypokalemia/Sodium abnormalities Electrolytes have been stable the last several days.    Hyperglycemia HbA1c 6.2.  Lantus was discontinued due to low blood glucose levels.  Continue SSI only.     Transaminitis Mildly abnormal LFTs noted.  Monitor periodically.  Urinary retention Appears to have resolved.  Foley catheter was discontinued.  Flomax.  Bladder scan PRN.    DVT prophylaxis: Lovenox Code Status: FULL Family communication: Discussed with his daughter yesterday Disposition: Continue to mobilize.  Not ready for discharge yet as he is still on high doses of oxygen.     Consultants:   PCCM  Procedures:   5/30 ETT  6/1 extubation  6/1 ETT x2 (reintubation)  6/5 extubation  Antimicrobials:   Azithromycin 5/29>> 6/1  Ceftriaxone 5/28>> 6/1  Remdesivir 5/30 >> 6/3  Culture data:   5/28 blood culture x1 -- NG  5/30 respiratory culture --NG  MRSA nares negative     Objective: Vitals:   04/23/19 0207 04/23/19 0500 04/23/19 0750 04/23/19 0800  BP:  104/83 105/87 116/83  Pulse:  94 (!) 103 94  Resp:  (!) 26  (!) 30  Temp:  98.8 F (37.1 C)  98.2 F (36.8 C)  TempSrc:  Oral  Oral  SpO2:  98%  96%  Weight: 65.3 kg     Height:        Intake/Output Summary (Last 24  hours) at 04/23/2019 1015 Last data filed at 04/23/2019 0800 Gross per 24 hour  Intake 910 ml  Output 1300 ml  Net -390 ml   Filed Weights   04/21/19 1605 04/22/19 0604 04/23/19 0207  Weight: 65.1 kg 42.2 kg 65.3 kg    Examination:  General appearance: Awake alert.  In no distress Resp: Coarse breath sounds bilaterally.  Improved aeration compared to the last several days.  Few crackles at the bases.  No wheezing or rhonchi.   Cardio: S1-S2 is normal regular.  No S3-S4.  No rubs murmurs or bruit GI: Abdomen is soft.  Nontender nondistended.  Bowel sounds are present normal.  No masses organomegaly Extremities:  No edema.  Full range of motion of lower extremities. Neurologic: Alert and oriented x3.  No focal neurological deficits.     Data Reviewed: I have personally reviewed following labs and imaging studies:  CBC: Recent Labs  Lab 04/19/19 0344 04/20/19 0257 04/21/19 0317 04/22/19 0400 04/23/19 0300  WBC 5.4 5.9 7.6 10.4 12.6*  HGB 13.3 12.6* 12.5* 12.3* 12.8*  HCT 42.8 40.3 38.5* 39.2 39.8  MCV 84.3 84.1 84.2 84.1 84.1  PLT 195 208 203 227 627   Basic Metabolic Panel: Recent Labs  Lab 04/19/19 0344 04/20/19 0257 04/21/19 0317 04/22/19 0400 04/23/19 0300  NA 136 132* 133* 134* 135  K 3.8 3.5 3.8 3.7 3.9  CL 98 95* 97* 96* 95*  CO2 27 28 26 28 26   GLUCOSE 88 99 90 108* 121*  BUN 37* 30* 22* 20 26*  CREATININE 0.92 0.88 0.92 0.90 0.94  CALCIUM 8.5* 8.1* 8.4* 8.5* 8.5*   GFR: Estimated Creatinine Clearance: 75.7 mL/min (by C-G formula based on SCr of 0.94 mg/dL). Liver Function Tests: Recent Labs  Lab 04/19/19 0344 04/20/19 0257 04/21/19 0317 04/22/19 0400 04/23/19 0300  AST 32 39 126* 79* 61*  ALT 28 33 117* 132* 121*  ALKPHOS 52 49 59 70 82  BILITOT 0.6 0.4 0.7 0.6 0.3  PROT 6.4* 6.3* 6.3* 6.6 7.1  ALBUMIN 2.4* 2.3* 2.4* 2.5* 2.6*   CBG: Recent Labs  Lab 04/22/19 0807 04/22/19 1143 04/22/19 1606 04/22/19 2010 04/23/19 0824  GLUCAP 119* 199* 112* 178* 136*     Recent Results (from the past 240 hour(s))  MRSA PCR Screening     Status: None   Collection Time: 04/13/19  1:26 PM   Specimen: Nasopharyngeal  Result Value Ref Range Status   MRSA by PCR NEGATIVE NEGATIVE Final    Comment:        The GeneXpert MRSA Assay (FDA approved for NASAL specimens only), is one component of a comprehensive MRSA colonization surveillance program. It is not intended to diagnose MRSA infection nor to guide or monitor treatment for MRSA infections. Performed at [facility], [place] [address], [place], [place] [address]          Radiology  Studies: Dg Chest Port 1 View  Result Date: 04/21/2019 CLINICAL DATA:  COVID-19 pneumonia EXAM: PORTABLE CHEST 1 VIEW COMPARISON:  04/12/2019 FINDINGS: Cardiac shadows within normal limits. The endotracheal tube is been removed. Diffuse bilateral opacities are noted consistent with the patient's given clinical history. The overall appearance is similar to that seen on the prior exam. No bony abnormality is noted. IMPRESSION: Stable multifocal infiltrate consistent with the patient's given clinical history. Electronically Signed   By: Inez Catalina M.D.   On: 04/21/2019 12:44     Scheduled Meds: . aspirin  325 mg Oral  Daily  . Chlorhexidine Gluconate Cloth  6 each Topical Daily  . dextromethorphan  30 mg Oral BID   And  . guaiFENesin  15 mL Oral Q6H  . enoxaparin (LOVENOX) injection  40 mg Subcutaneous Q12H  . feeding supplement (ENSURE ENLIVE)  237 mL Oral BID BM  . insulin aspart  0-15 Units Subcutaneous TID WC  . Ipratropium-Albuterol  1 puff Inhalation Q6H WA  . mouth rinse  15 mL Mouth Rinse BID  . metoprolol tartrate  12.5 mg Oral BID  . multivitamin  15 mL Oral Daily  . polyethylene glycol  17 g Oral Daily  . sodium chloride flush  3 mL Intravenous Q12H  . tamsulosin  0.4 mg Oral QPC breakfast   Continuous Infusions: . sodium chloride Stopped (04/16/19 2015)     LOS: 15 days   Melvin ShipperGokul Xiana Carns, MD [facility] 04/23/2019, 10:15 AM     Please page through AMION:  [email or web address] Password [email or web address] If 7PM-7AM, please contact night-coverage

## 2019-04-23 NOTE — Progress Notes (Signed)
[facility] and AHF Team  ReDS Research Project   Patient #: [id number]  ReDS Measurement  Right: 39%  Left: low quality x3

## 2019-04-24 LAB — COMPREHENSIVE METABOLIC PANEL
ALT: 92 U/L — ABNORMAL HIGH (ref 0–44)
AST: 41 U/L (ref 15–41)
Albumin: 2.7 g/dL — ABNORMAL LOW (ref 3.5–5.0)
Alkaline Phosphatase: 84 U/L (ref 38–126)
Anion gap: 10 (ref 5–15)
BUN: 24 mg/dL — ABNORMAL HIGH (ref 6–20)
CO2: 28 mmol/L (ref 22–32)
Calcium: 8.8 mg/dL — ABNORMAL LOW (ref 8.9–10.3)
Chloride: 95 mmol/L — ABNORMAL LOW (ref 98–111)
Creatinine, Ser: 0.96 mg/dL (ref 0.61–1.24)
GFR calc Af Amer: >60 mL/min (ref >60)
GFR calc non Af Amer: >60 mL/min (ref >60)
Glucose, Bld: 107 mg/dL — ABNORMAL HIGH (ref 70–99)
Potassium: 4.3 mmol/L (ref 3.5–5.1)
Sodium: 133 mmol/L — ABNORMAL LOW (ref 135–145)
Total Bilirubin: 0.5 mg/dL (ref 0.3–1.2)
Total Protein: 7.3 g/dL (ref 6.5–8.1)

## 2019-04-24 LAB — D-DIMER, QUANTITATIVE: D-Dimer, Quant: 0.5 ug/mL-FEU (ref 0.00–0.50)

## 2019-04-24 LAB — GLUCOSE, CAPILLARY
Glucose-Capillary: 118 mg/dL — ABNORMAL HIGH (ref 70–99)
Glucose-Capillary: 121 mg/dL — ABNORMAL HIGH (ref 70–99)
Glucose-Capillary: 123 mg/dL — ABNORMAL HIGH (ref 70–99)
Glucose-Capillary: 130 mg/dL — ABNORMAL HIGH (ref 70–99)
Glucose-Capillary: 152 mg/dL — ABNORMAL HIGH (ref 70–99)

## 2019-04-24 LAB — CBC
HCT: 38.6 % — ABNORMAL LOW (ref 39.0–52.0)
Hemoglobin: 12.3 g/dL — ABNORMAL LOW (ref 13.0–17.0)
MCH: 26.8 pg (ref 26.0–34.0)
MCHC: 31.9 g/dL (ref 30.0–36.0)
MCV: 84.1 fL (ref 80.0–100.0)
Platelets: 307 10*3/uL (ref 150–400)
RBC: 4.59 MIL/uL (ref 4.22–5.81)
RDW: 12.9 % (ref 11.5–15.5)
WBC: 12.7 10*3/uL — ABNORMAL HIGH (ref 4.0–10.5)
nRBC: 0 % (ref 0.0–0.2)

## 2019-04-24 LAB — C-REACTIVE PROTEIN: CRP: 8.3 mg/dL — ABNORMAL HIGH (ref <1.0)

## 2019-04-24 MED ORDER — POTASSIUM CHLORIDE CRYS ER 20 MEQ PO TBCR
20.0000 meq | EXTENDED_RELEASE_TABLET | Freq: Once | ORAL | Status: AC
  Administered 2019-04-24: 20 meq via ORAL
  Filled 2019-04-24: qty 1

## 2019-04-24 MED ORDER — FUROSEMIDE 10 MG/ML IJ SOLN
40.0000 mg | Freq: Once | INTRAMUSCULAR | Status: AC
  Administered 2019-04-24: 40 mg via INTRAVENOUS
  Filled 2019-04-24: qty 4

## 2019-04-24 NOTE — Progress Notes (Signed)
PROGRESS NOTE    Melvin BodilyY'Niang Mills  ZOX:[id number] DOB: 11/03/1971 DOA: 04/08/2019 PCP: Melvin Mills, Melvin P, NP    Brief Narrative:  [name] is a 48 y.o. M with no significant PMHx who presented with 1 week fever, SOB, productive cough.  In the ER, CXR showed bilateral infiltrates.  SARS-CoV-2 NAA positive.  Admitted to floor. Progressively hypoxic 5/30 requiring intubation.  Extubated 6/1, developed respiratory distress and new onset Afib and was reintubated.   Subjective:  Patient's family member was used to interpret.  Patient complains of feeling fatigued with occasional cough.  Overall he states that he is slightly better compared to how he was 2 to 3 days ago.  No new complaints.    Assessment & Plan:  Acute hypoxic respiratory failure due to COVID-19/viral pneumonia  Patient has been very slow to improve.  He is still on high flow nasal cannula at 6 L/min.  Saturating in the 90s.  He tends to desaturate rapidly when he exerts himself.  Prone positioning as much as possible.  Patient has been given Lasix the last few days.  Repeat another dose today.  Incentive spirometry.  Mobilization.  CRP remains elevated.  WBC mildly elevated.  He had a low-grade fever.  Clinically he is stable.  D-dimer is normal.  Continue to monitor for now.  If he continues to have fevers and elevated WBC, may need to check a procalcitonin level.  - S/p convalescent plasma 6/1.   - S/p Azithro and ceftriaxone x5d. - S/p remdesivir 5/30 to 6/3 -Not considered candidate for Actemra due to abnormal LFTs.  Brief episode of paroxysmal atrial fibrillation  This occurred in the intensive care unit after he was extubated the first time.  Presumably due to respiratory distress.  Has not had any further episodes since then.  His chads 2 vascular score is 0.  He remains in sinus rhythm.  He was noted to have low TSH with normal T4.  Will recommend repeating thyroid function tests in a few weeks.  Continue aspirin  and metoprolol.  Echocardiogram in the outpatient setting.    Hypokalemia/Sodium abnormalities Electrolytes have been stable the last several days.    Hyperglycemia HbA1c 6.2.  Lantus was discontinued due to low blood glucose levels.  Continue SSI only.     Transaminitis Mildly abnormal LFTs noted.  Monitor periodically.  Urinary retention Appears to have resolved.  Foley catheter was discontinued.  Flomax.  Bladder scan PRN.    DVT prophylaxis: Lovenox Code Status: FULL Family communication: Discussed with his daughter on a daily basis. Disposition: Continue to mobilize.  Still requiring high doses of oxygen.   Consultants:   PCCM  Procedures:   5/30 ETT  6/1 extubation  6/1 ETT x2 (reintubation)  6/5 extubation  Antimicrobials:   Azithromycin 5/29>> 6/1  Ceftriaxone 5/28>> 6/1  Remdesivir 5/30 >> 6/3  Culture data:   5/28 blood culture x1 -- NG  5/30 respiratory culture --NG  MRSA nares negative     Objective: Vitals:   04/23/19 1600 04/23/19 2111 04/24/19 0440 04/24/19 0741  BP: 110/81 119/76 101/80 100/77  Pulse: (!) 111 (!) 113 (!) 106 (!) 3  Resp: (!) 34  (!) 37 (!) 38  Temp: 98.7 F (37.1 C)  100.1 F (37.8 C) 99.3 F (37.4 C)  TempSrc: Oral  Oral Oral  SpO2: 98%  96% 97%  Weight:   65 kg   Height:        Intake/Output Summary (Last 24  hours) at 04/24/2019 1022 Last data filed at 04/24/2019 0440 Gross per 24 hour  Intake 720 ml  Output 625 ml  Net 95 ml   Filed Weights   04/22/19 0604 04/23/19 0207 04/24/19 0440  Weight: 42.2 kg 65.3 kg 65 kg    Examination:  General appearance: Awake alert.  In no distress Resp: Normal effort at rest but gets dyspneic on exertion.  Coarse breath sounds bilaterally.  Few crackles at the bases.  No wheezing or rhonchi. Cardio: S1-S2 is normal regular.  No S3-S4.  No rubs murmurs or bruit GI: Abdomen is soft.  Nontender nondistended.  Bowel sounds are present normal.  No masses organomegaly  Extremities: No edema.  Full range of motion of lower extremities. Neurologic: Alert and oriented x3.  No focal neurological deficits.      Data Reviewed: I have personally reviewed following labs and imaging studies:  CBC: Recent Labs  Lab 04/20/19 0257 04/21/19 0317 04/22/19 0400 04/23/19 0300 04/24/19 0400  WBC 5.9 7.6 10.4 12.6* 12.7*  HGB 12.6* 12.5* 12.3* 12.8* 12.3*  HCT 40.3 38.5* 39.2 39.8 38.6*  MCV 84.1 84.2 84.1 84.1 84.1  PLT 208 203 227 252 638   Basic Metabolic Panel: Recent Labs  Lab 04/20/19 0257 04/21/19 0317 04/22/19 0400 04/23/19 0300 04/24/19 0400  NA 132* 133* 134* 135 133*  K 3.5 3.8 3.7 3.9 4.3  CL 95* 97* 96* 95* 95*  CO2 28 26 28 26 28   GLUCOSE 99 90 108* 121* 107*  BUN 30* 22* 20 26* 24*  CREATININE 0.88 0.92 0.90 0.94 0.96  CALCIUM 8.1* 8.4* 8.5* 8.5* 8.8*   GFR: Estimated Creatinine Clearance: 74.1 mL/min (by C-G formula based on SCr of 0.96 mg/dL). Liver Function Tests: Recent Labs  Lab 04/20/19 0257 04/21/19 0317 04/22/19 0400 04/23/19 0300 04/24/19 0400  AST 39 126* 79* 61* 41  ALT 33 117* 132* 121* 92*  ALKPHOS 49 59 70 82 84  BILITOT 0.4 0.7 0.6 0.3 0.5  PROT 6.3* 6.3* 6.6 7.1 7.3  ALBUMIN 2.3* 2.4* 2.5* 2.6* 2.7*   CBG: Recent Labs  Lab 04/23/19 1559 04/23/19 2026 04/24/19 0019 04/24/19 0439 04/24/19 0744  GLUCAP 193* 136* 118* 121* 123*     No results found for this or any previous visit (from the past 240 hour(s)).       Radiology Studies: No results found.   Scheduled Meds: . aspirin  325 mg Oral Daily  . Chlorhexidine Gluconate Cloth  6 each Topical Daily  . dextromethorphan  30 mg Oral BID   And  . guaiFENesin  15 mL Oral Q6H  . enoxaparin (LOVENOX) injection  40 mg Subcutaneous Q12H  . feeding supplement (ENSURE ENLIVE)  237 mL Oral BID BM  . insulin aspart  0-15 Units Subcutaneous TID WC  . Ipratropium-Albuterol  1 puff Inhalation Q6H WA  . mouth rinse  15 mL Mouth Rinse BID  .  metoprolol tartrate  12.5 mg Oral BID  . multivitamin  15 mL Oral Daily  . polyethylene glycol  17 g Oral Daily  . sodium chloride flush  3 mL Intravenous Q12H  . tamsulosin  0.4 mg Oral QPC breakfast   Continuous Infusions: . sodium chloride Stopped (04/16/19 2015)     LOS: 16 days   Bonnielee Haff, MD [facility] 04/24/2019, 10:22 AM     Please page through [place]:  [email or web address] Password [email or web address] If 7PM-7AM, please contact night-coverage

## 2019-04-24 NOTE — Progress Notes (Signed)
CardioVascular Research Department and AHF Team  ReDS Research Project   Patient #: [id number]  ReDS Measurement  Right: 34 %  Left: low quality x 3

## 2019-04-24 NOTE — TOC Initial Note (Signed)
Transition of Care (TOC) - Initial/Assessment Note    Patient Details  Name: Melvin Mills MRN: [id number] Date of Birth: [birth year]  Transition of Care [facility] [place]) CM/SW Contact:    Ninfa Meeker, RN Phone Number: 04/24/2019, 3:16 PM  Clinical Narrative: 48 yr old vietnamese gentleman admitted with COVID 85. Patient is slowly improving. Case manager spoke with patient through an interpreter with the [employer].Case Manager explained reason for call, choice for home health offered. Patient has no preference, Case Manager confirmed patient's address and phone number. He lives with his wife and children. CM will continue to monitor for possible oxygen needs at discharge.    Expected Discharge Plan: [place] Barriers to Discharge: Continued Medical Work up   Patient Goals and CMS Choice Patient states their goals for this hospitalization and ongoing recovery are:: to get better   Choice offered to / list presented to : Patient  Expected Discharge Plan and Services Expected Discharge Plan: [place] Acute Care Choice: [place] arrangements for the past 2 months: Single Family Home Expected Discharge Date: 04/13/19                         HH Arranged: PT HH Agency: [place] Date [place]: 04/24/19 Time HH Agency Contacted: 67 Representative spoke with at [place]: Adela Lank  Prior Living Arrangements/Services Living arrangements for the past 2 months: [place] with:: Spouse, Minor Children Patient language and need for interpreter reviewed:: Yes(Used [place] interpreter: [place]) Do you feel safe going back to the place where you live?: Yes      Need for Family Participation in Patient Care: Yes (Comment)        Activities of Daily Living Home Assistive Devices/Equipment: None ADL Screening (condition at time of admission) Patient's cognitive ability  adequate to safely complete daily activities?: Yes Is the patient deaf or have difficulty hearing?: No Does the patient have difficulty seeing, even when wearing glasses/contacts?: No Does the patient have difficulty concentrating, remembering, or making decisions?: No Patient able to express need for assistance with ADLs?: Yes Does the patient have difficulty dressing or bathing?: No Independently performs ADLs?: Yes (appropriate for developmental age) Does the patient have difficulty walking or climbing stairs?: No Weakness of Legs: None Weakness of Arms/Hands: None  Permission Sought/Granted Permission sought to share information with : Case Manager                Emotional Assessment       Orientation: : Oriented to Self, Oriented to Place, Oriented to  Time, Oriented to Situation      Admission diagnosis:  COVID-19 [U07.1, J98.8] Patient Active Problem List   Diagnosis Date Noted  . Acute respiratory disease due to COVID-19 virus 04/08/2019   PCP:  Kerin Perna, NP Pharmacy:   [id number] - [place], [place] [address] [place] [address] Phone: [phone] Fax: [phone]     Social Determinants of Health (SDOH) Interventions    Readmission Risk Interventions No flowsheet data found.

## 2019-04-25 LAB — GLUCOSE, CAPILLARY
Glucose-Capillary: 109 mg/dL — ABNORMAL HIGH (ref 70–99)
Glucose-Capillary: 112 mg/dL — ABNORMAL HIGH (ref 70–99)
Glucose-Capillary: 113 mg/dL — ABNORMAL HIGH (ref 70–99)
Glucose-Capillary: 114 mg/dL — ABNORMAL HIGH (ref 70–99)
Glucose-Capillary: 121 mg/dL — ABNORMAL HIGH (ref 70–99)
Glucose-Capillary: 130 mg/dL — ABNORMAL HIGH (ref 70–99)

## 2019-04-25 NOTE — Progress Notes (Signed)
CardioVascular Research Department and AHF Team  ReDS Research Project   Patient #: [id number]  ReDS Measurement  Right: 35 %  Left: low quality x 3

## 2019-04-25 NOTE — Progress Notes (Signed)
PROGRESS NOTE    Melvin Mills  ZOX:[id number] DOB: 10/20/1971 DOA: 04/08/2019 PCP: Grayce SessionsEdwards, [name], NP    Brief Narrative:  Melvin Mills is a 48 y.o. M with no significant PMHx who presented with 1 week fever, SOB, productive cough.  In the ER, CXR showed bilateral infiltrates.  SARS-CoV-2 NAA positive.  Admitted to floor. Progressively hypoxic 5/30 requiring intubation.  Extubated 6/1, developed respiratory distress and new onset Afib and was reintubated.   Subjective:  Used interpreter services to communicate with the patient today.  Patient states that overall he is feeling better but continues to have fatigue.  Some shortness of breath with minimal exertion.  Occasional cough.  No dizziness or lightheadedness.  No chest pain.    Assessment & Plan:  Acute hypoxic respiratory failure due to COVID-19/viral pneumonia  Patient has been slow to improve.  However he is now down to 4 L of oxygen by high flow nasal cannula.  Saturating in the 90s.  Does tend to desaturate when he exerts himself.  Patient encouraged to do incentive spirometry in prone positioning.  Continue to mobilize with physical therapy.  Recheck labs tomorrow.  Will also do a chest x-ray tomorrow.  He has been given doses of Lasix the last few days.  Hold off today.  - S/p convalescent plasma 6/1.   - S/p Azithro and ceftriaxone x5d. - S/p remdesivir 5/30 to 6/3 -Not considered candidate for Actemra due to abnormal LFTs.  Brief episode of paroxysmal atrial fibrillation  This occurred in the intensive care unit after he was extubated the first time.  Presumably due to respiratory distress.  Has not had any further episodes since then.  His chads 2 vascular score is 0.  He remains in sinus rhythm.  He was noted to have low TSH with normal T4.  Will recommend repeating thyroid function tests in a few weeks.  Continue aspirin and metoprolol.  Echocardiogram in the outpatient setting.    Hypokalemia/Sodium  abnormalities Electrolytes have been stable the last several days.    Hyperglycemia HbA1c 6.2.  Lantus was discontinued due to low blood glucose levels.  Continue SSI only.  CBGs are stable.   Transaminitis Mildly abnormal LFTs noted.  Monitor periodically.  Urinary retention Appears to have resolved.  Foley catheter was discontinued.  Flomax.  Bladder scan PRN.    DVT prophylaxis: Lovenox Code Status: FULL Family communication: Discussed with his daughter on a daily basis. Disposition: Continue to mobilize.  Still requiring high doses of oxygen.   Consultants:   PCCM  Procedures:   5/30 ETT  6/1 extubation  6/1 ETT x2 (reintubation)  6/5 extubation  Antimicrobials:   Azithromycin 5/29>> 6/1  Ceftriaxone 5/28>> 6/1  Remdesivir 5/30 >> 6/3  Culture data:   5/28 blood culture x1 -- NG  5/30 respiratory culture --NG  MRSA nares negative     Objective: Vitals:   04/25/19 0404 04/25/19 0406 04/25/19 0743 04/25/19 0800  BP: 96/82  (!) 112/91 92/62  Pulse:      Resp: (!) 34  (!) 33 (!) 21  Temp: 99.7 F (37.6 C)  99.7 F (37.6 C)   TempSrc: Oral  Oral   SpO2: 95%  99% 95%  Weight:  65.8 kg    Height:        Intake/Output Summary (Last 24 hours) at 04/25/2019 1001 Last data filed at 04/24/2019 2351 Gross per 24 hour  Intake 656 ml  Output 1450 ml  Net -794 ml  Filed Weights   04/23/19 0207 04/24/19 0440 04/25/19 0406  Weight: 65.3 kg 65 kg 65.8 kg    Examination:  General appearance: Awake alert.  In no distress Resp: Normal effort at rest.  Coarse breath sounds bilaterally.  No wheezing rales or rhonchi.   Cardio: S1-S2 is normal regular.  No S3-S4.  No rubs murmurs or bruit GI: Abdomen is soft.  Nontender nondistended.  Bowel sounds are present normal.  No masses organomegaly Extremities: No edema.  Full range of motion of lower extremities. Neurologic: Alert and oriented x3.  No focal neurological deficits.    Data Reviewed: I have  personally reviewed following labs and imaging studies:  CBC: Recent Labs  Lab 04/20/19 0257 04/21/19 0317 04/22/19 0400 04/23/19 0300 04/24/19 0400  WBC 5.9 7.6 10.4 12.6* 12.7*  HGB 12.6* 12.5* 12.3* 12.8* 12.3*  HCT 40.3 38.5* 39.2 39.8 38.6*  MCV 84.1 84.2 84.1 84.1 84.1  PLT 208 203 227 252 659   Basic Metabolic Panel: Recent Labs  Lab 04/20/19 0257 04/21/19 0317 04/22/19 0400 04/23/19 0300 04/24/19 0400  NA 132* 133* 134* 135 133*  K 3.5 3.8 3.7 3.9 4.3  CL 95* 97* 96* 95* 95*  CO2 28 26 28 26 28   GLUCOSE 99 90 108* 121* 107*  BUN 30* 22* 20 26* 24*  CREATININE 0.88 0.92 0.90 0.94 0.96  CALCIUM 8.1* 8.4* 8.5* 8.5* 8.8*   GFR: Estimated Creatinine Clearance: 74.1 mL/min (by C-G formula based on SCr of 0.96 mg/dL). Liver Function Tests: Recent Labs  Lab 04/20/19 0257 04/21/19 0317 04/22/19 0400 04/23/19 0300 04/24/19 0400  AST 39 126* 79* 61* 41  ALT 33 117* 132* 121* 92*  ALKPHOS 49 59 70 82 84  BILITOT 0.4 0.7 0.6 0.3 0.5  PROT 6.3* 6.3* 6.6 7.1 7.3  ALBUMIN 2.3* 2.4* 2.5* 2.6* 2.7*   CBG: Recent Labs  Lab 04/24/19 1143 04/24/19 2102 04/24/19 2349 04/25/19 0403 04/25/19 0746  GLUCAP 152* 130* 114* 113* 109*     No results found for this or any previous visit (from the past 240 hour(s)).       Radiology Studies: No results found.   Scheduled Meds: . aspirin  325 mg Oral Daily  . Chlorhexidine Gluconate Cloth  6 each Topical Daily  . dextromethorphan  30 mg Oral BID   And  . guaiFENesin  15 mL Oral Q6H  . enoxaparin (LOVENOX) injection  40 mg Subcutaneous Q12H  . feeding supplement (ENSURE ENLIVE)  237 mL Oral BID BM  . insulin aspart  0-15 Units Subcutaneous TID WC  . Ipratropium-Albuterol  1 puff Inhalation Q6H WA  . mouth rinse  15 mL Mouth Rinse BID  . metoprolol tartrate  12.5 mg Oral BID  . multivitamin  15 mL Oral Daily  . polyethylene glycol  17 g Oral Daily  . sodium chloride flush  3 mL Intravenous Q12H  . tamsulosin   0.4 mg Oral QPC breakfast   Continuous Infusions: . sodium chloride Stopped (04/16/19 2015)     LOS: 17 days   Bonnielee Haff, MD [facility] 04/25/2019, 10:01 AM     Please page through [place]:  [email or web address] Password [email or web address] If 7PM-7AM, please contact night-coverage

## 2019-04-25 NOTE — Progress Notes (Signed)
Occupational Therapy Treatment Patient Details Name: Melvin Mills MRN: [id number] DOB: 1971-05-25 Today's Date: 04/25/2019    History of present illness 48 y.o. male admitted on 04/08/19 with fever.  Pt dx with COVID-19.  Pt became progressively hypoxic requiring intubation 04/10/19 attempted extubation 04/12/19, but developed new onset a-fib and respiratory distress and had to be re-intubated on the same day.  Extubated 6/5. Pt with no other listed PMH on file.   Pt also with hypokalemia and hyperglycemia.     OT comments  Pt progressing towards OT goals; tolerating room level mobility and ADL at bathroom level during session. Pt requiring close minguard assist for mobility using RW, min cues for safety and safe use of RW with mobility. Pt performing toileting with close minguard assist; standing grooming ADL with minA. After seated rest pt participating in few bil UE theraband exercises. Pt's overall respiratory status improved from most recent therapy sessions. Pt initially on 3L O2 start of session; increased to 4L for activity, with lowest sat noted 83%; overall sats quick to return to >90% with seated rest and cues for deep breathing (approx 1 min to rebound); max RR up to 45-46 and max HR noted 126 with activity. Pt returned to 3L O2 end of session while seated in recliner with sats >90%. Feel POC remains appropriate at this time. Will continue to follow acutely.    Follow Up Recommendations  Home health OT;Supervision/Assistance - 24 hour    Equipment Recommendations  3 in 1 bedside commode          Precautions / Restrictions Precautions Precautions: Fall Precaution Comments: monitor vitals Restrictions Weight Bearing Restrictions: No       Mobility Bed Mobility Overal bed mobility: Needs Assistance Bed Mobility: Supine to Sit     Supine to sit: Supervision     General bed mobility comments: for general lines/safety  Transfers Overall transfer level: Needs  assistance Equipment used: Rolling walker (2 wheeled) Transfers: Sit to/from Stand Sit to Stand: Min guard         General transfer comment: Min guard assist for safety, used RW for stability.     Balance Overall balance assessment: Needs assistance Sitting-balance support: Feet supported;No upper extremity supported Sitting balance-Leahy Scale: Good     Standing balance support: Bilateral upper extremity supported;During functional activity Standing balance-Leahy Scale: Fair Standing balance comment: pt able to maintain static standing with close minguard for safety                           ADL either performed or assessed with clinical judgement   ADL Overall ADL's : Needs assistance/impaired     Grooming: Wash/dry hands;Min guard;Minimal assistance;Standing Grooming Details (indicate cue type and reason): cues to use soap, etc during task; minguard-light minA for standing balance                 Toilet Transfer: Min guard;Ambulation;Regular Toilet;RW Toilet Transfer Details (indicate cue type and reason): ambulating into bathroom in room to attempt for BM Toileting- Clothing Manipulation and Hygiene: Min guard;Sit to/from stand Toileting - Clothing Manipulation Details (indicate cue type and reason): pt performing peri-care via lateral lean and sit<>stand with minguard for safety with balance      Functional mobility during ADLs: Min guard;Minimal assistance;Rolling walker General ADL Comments: assisted with toileting and transitioned to chair post ADL tasks  Cognition Arousal/Alertness: Awake/alert Behavior During Therapy: Flat affect Overall Cognitive Status: Within Functional Limits for tasks assessed                                 General Comments: attempted to use stratus interpreter however speaker not working properly and pt with urgency to get to bathroom so did not attempt to get another stratus;  pt does require min cues for safety with mobility and during RW use; pt also reporting feeling sad end of session given current status - utilized therapeutic use of self to encourage pt and try to lift his spirits        Exercises Exercises: General Upper Extremity;Other exercises General Exercises - Upper Extremity Shoulder Flexion: AROM;Both;10 reps;Theraband Theraband Level (Shoulder Flexion): Level 1 (Yellow) Shoulder Horizontal ABduction: AROM;Both;10 reps;Seated;Theraband Theraband Level (Shoulder Horizontal Abduction): Level 1 (Yellow) Shoulder Horizontal ADduction: AROM;Both;10 reps;Theraband Theraband Level (Shoulder Horizontal Adduction): Level 1 (Yellow) Other Exercises Other Exercises: seated diagonal reach level 1 theraband, x10 bil UE Other Exercises: IS use x3-4 reps   Shoulder Instructions       General Comments pt initially on 3L start of session; increased to 4L for activity, lowest sat noted 83% with sats quick to return to >90%; max RR up to 40s and max HR noted 126 with activity; pt on 3L O2 end of session with sats >90%    Pertinent Vitals/ Pain       Pain Assessment: Faces Faces Pain Scale: Hurts little more Pain Location: chest with increased WOB Pain Descriptors / Indicators: Discomfort Pain Intervention(s): Monitored during session;Limited activity within patient's tolerance  Home Living                                          Prior Functioning/Environment              Frequency  Min 3X/week        Progress Toward Goals  OT Goals(current goals can now be found in the care plan section)  Progress towards OT goals: Progressing toward goals  Acute Rehab OT Goals Patient Stated Goal: to get better OT Goal Formulation: With patient Time For Goal Achievement: 04/29/19 Potential to Achieve Goals: Good ADL Goals Pt Will Perform Upper Body Bathing: with set-up;sitting Pt Will Perform Lower Body Bathing: with min guard  assist;sit to/from stand Pt Will Perform Upper Body Dressing: with set-up;sitting Pt Will Perform Lower Body Dressing: with min guard assist;sit to/from stand Pt Will Transfer to Toilet: with supervision;regular height toilet;ambulating Pt/caregiver will Perform Home Exercise Program: Increased strength;Both right and left upper extremity;With written HEP provided;With theraband  Plan Discharge plan remains appropriate    Co-evaluation                 AM-PAC OT "6 Clicks" Daily Activity     Outcome Measure   Help from another person eating meals?: None Help from another person taking care of personal grooming?: A Little Help from another person toileting, which includes using toliet, bedpan, or urinal?: A Little Help from another person bathing (including washing, rinsing, drying)?: A Lot Help from another person to put on and taking off regular upper body clothing?: A Little Help from another person to put on and taking off regular lower body clothing?: A Little 6 Click Score: 18  End of Session Equipment Utilized During Treatment: Rolling walker;Oxygen  OT Visit Diagnosis: Unsteadiness on feet (R26.81);Muscle weakness (generalized) (M62.81);Other abnormalities of gait and mobility (R26.89)   Activity Tolerance Patient tolerated treatment well   Patient Left in chair;with call bell/phone within reach   Nurse Communication Mobility status(O2 sats)        Time: [id number] OT Time Calculation (min): 38 min  Charges: OT General Charges $OT Visit: 1 Visit OT Treatments $Self Care/Home Management : 23-37 mins  Marcy SirenBreanna Leeman Johnsey, OT [facility] Pager [phone] Office [phone]    Orlando PennerBreanna L Robertlee Rogacki 04/25/2019, 4:05 PM

## 2019-04-25 NOTE — Plan of Care (Signed)
  Problem: Health Behavior/Discharge Planning: Goal: Ability to manage health-related needs will improve Outcome: Progressing   Problem: Clinical Measurements: Goal: Ability to maintain clinical measurements within normal limits will improve Outcome: Progressing Goal: Will remain free from infection Outcome: Progressing Goal: Diagnostic test results will improve Outcome: Progressing Goal: Respiratory complications will improve Outcome: Progressing Goal: Cardiovascular complication will be avoided Outcome: Progressing   Problem: Activity: Goal: Risk for activity intolerance will decrease Outcome: Progressing   Problem: Nutrition: Goal: Adequate nutrition will be maintained Outcome: Progressing   Problem: Coping: Goal: Level of anxiety will decrease Outcome: Progressing   Problem: Education: Goal: Knowledge of risk factors and measures for prevention of condition will improve Outcome: Progressing   Problem: Coping: Goal: Psychosocial and spiritual needs will be supported Outcome: Progressing   Problem: Respiratory: Goal: Will maintain a patent airway Outcome: Progressing Goal: Complications related to the disease process, condition or treatment will be avoided or minimized Outcome: Progressing   

## 2019-04-25 NOTE — Progress Notes (Signed)
Pt declined for nurse to call family, He will update family. Melvin Mills

## 2019-04-26 ENCOUNTER — Inpatient Hospital Stay (HOSPITAL_COMMUNITY): Payer: BC Managed Care – PPO

## 2019-04-26 LAB — COMPREHENSIVE METABOLIC PANEL
ALT: 66 U/L — ABNORMAL HIGH (ref 0–44)
AST: 30 U/L (ref 15–41)
Albumin: 2.6 g/dL — ABNORMAL LOW (ref 3.5–5.0)
Alkaline Phosphatase: 74 U/L (ref 38–126)
Anion gap: 12 (ref 5–15)
BUN: 19 mg/dL (ref 6–20)
CO2: 28 mmol/L (ref 22–32)
Calcium: 8.7 mg/dL — ABNORMAL LOW (ref 8.9–10.3)
Chloride: 93 mmol/L — ABNORMAL LOW (ref 98–111)
Creatinine, Ser: 0.98 mg/dL (ref 0.61–1.24)
GFR calc Af Amer: >60 mL/min (ref >60)
GFR calc non Af Amer: >60 mL/min (ref >60)
Glucose, Bld: 96 mg/dL (ref 70–99)
Potassium: 4 mmol/L (ref 3.5–5.1)
Sodium: 133 mmol/L — ABNORMAL LOW (ref 135–145)
Total Bilirubin: 0.5 mg/dL (ref 0.3–1.2)
Total Protein: 7.1 g/dL (ref 6.5–8.1)

## 2019-04-26 LAB — CBC WITH DIFFERENTIAL/PLATELET
Abs Immature Granulocytes: 0.04 10*3/uL (ref 0.00–0.07)
Basophils Absolute: 0 10*3/uL (ref 0.0–0.1)
Basophils Relative: 0 %
Eosinophils Absolute: 0.2 10*3/uL (ref 0.0–0.5)
Eosinophils Relative: 2 %
HCT: 38.5 % — ABNORMAL LOW (ref 39.0–52.0)
Hemoglobin: 12 g/dL — ABNORMAL LOW (ref 13.0–17.0)
Immature Granulocytes: 1 %
Lymphocytes Relative: 10 %
Lymphs Abs: 0.7 10*3/uL (ref 0.7–4.0)
MCH: 26.3 pg (ref 26.0–34.0)
MCHC: 31.2 g/dL (ref 30.0–36.0)
MCV: 84.2 fL (ref 80.0–100.0)
Monocytes Absolute: 0.7 10*3/uL (ref 0.1–1.0)
Monocytes Relative: 10 %
Neutro Abs: 5.6 10*3/uL (ref 1.7–7.7)
Neutrophils Relative %: 77 %
Platelets: 274 10*3/uL (ref 150–400)
RBC: 4.57 MIL/uL (ref 4.22–5.81)
RDW: 12.9 % (ref 11.5–15.5)
WBC: 7.2 10*3/uL (ref 4.0–10.5)
nRBC: 0 % (ref 0.0–0.2)

## 2019-04-26 LAB — GLUCOSE, CAPILLARY
Glucose-Capillary: 101 mg/dL — ABNORMAL HIGH (ref 70–99)
Glucose-Capillary: 107 mg/dL — ABNORMAL HIGH (ref 70–99)
Glucose-Capillary: 170 mg/dL — ABNORMAL HIGH (ref 70–99)
Glucose-Capillary: 136 mg/dL — ABNORMAL HIGH (ref 70–99)
Glucose-Capillary: 131 mg/dL — ABNORMAL HIGH (ref 70–99)

## 2019-04-26 LAB — C-REACTIVE PROTEIN: CRP: 9.6 mg/dL — ABNORMAL HIGH (ref <1.0)

## 2019-04-26 MED ORDER — FUROSEMIDE 10 MG/ML IJ SOLN
40.0000 mg | Freq: Once | INTRAMUSCULAR | Status: AC
  Administered 2019-04-26: 12:00:00 40 mg via INTRAVENOUS
  Filled 2019-04-26: qty 4

## 2019-04-26 MED ORDER — IPRATROPIUM-ALBUTEROL 20-100 MCG/ACT IN AERS: 1.0000 | INHALATION_SPRAY | RESPIRATORY_TRACT | Status: DC | PRN

## 2019-04-26 NOTE — Progress Notes (Signed)
Physical Therapy Treatment Patient Details Name: Melvin Mills MRN: [id number] DOB: 12/11/1970 Today's Date: 04/26/2019    History of Present Illness 48 y.o. male admitted on 04/08/19 with fever.  Pt dx with COVID-19.  Pt became progressively hypoxic requiring intubation 04/10/19 attempted extubation 04/12/19, but developed new onset a-fib and respiratory distress and had to be re-intubated on the same day.  Extubated 6/5. Pt with no other listed PMH on file.   Pt also with hypokalemia and hyperglycemia.      PT Comments     The patient  Did ambu;ate on 2 L [place] with Sats down to 89%. Patient noted 3-4/4 dyspnea. Continue to  Progress mobility.   Follow Up Recommendations  Home health PT;Supervision for mobility/OOB     Equipment Recommendations  None recommended by PT    Recommendations for Other Services       Precautions / Restrictions Precautions Precaution Comments: monitor vitals    Mobility  Bed Mobility Overal bed mobility: Needs Assistance Bed Mobility: Supine to Sit     Supine to sit: Supervision        Transfers Overall transfer level: Needs assistance Equipment used: Rolling walker (2 wheeled) Transfers: Sit to/from Stand Sit to Stand: Min guard         General transfer comment: Min guard assist for safety, used RW for stability.   Ambulation/Gait Ambulation/Gait assistance: Min guard Gait Distance (Feet): 20 Feet(then 100) Assistive device: Rolling walker (2 wheeled) Gait Pattern/deviations: Step-through pattern;Shuffle     General Gait Details: patient is steady  with RW, noted SOB at end of  walk, Sats 89% on 2 liters   Stairs             Wheelchair Mobility    Modified Rankin (Stroke Patients Only)       Balance                                            Cognition Arousal/Alertness: Awake/alert   Overall Cognitive Status: Within Functional Limits for tasks assessed                                  General Comments: attempted to use stratus interpreter however never came on. Pt followed basic instruction      Exercises      General Comments        Pertinent Vitals/Pain Pain Assessment: No/denies pain    Home Living                      Prior Function            PT Goals (current goals can now be found in the care plan section) Progress towards PT goals: Progressing toward goals    Frequency    Min 3X/week      PT Plan Current plan remains appropriate    Co-evaluation              AM-PAC PT "6 Clicks" Mobility   Outcome Measure  Help needed turning from your back to your side while in a flat bed without using bedrails?: None Help needed moving from lying on your back to sitting on the side of a flat bed without using bedrails?: None Help needed moving to and from a bed to a chair (including  a wheelchair)?: A Little Help needed standing up from a chair using your arms (e.g., wheelchair or bedside chair)?: A Little Help needed to walk in hospital room?: A Little Help needed climbing 3-5 steps with a railing? : A Little 6 Click Score: 20    End of Session Equipment Utilized During Treatment: Oxygen Activity Tolerance: Treatment limited secondary to medical complications (Comment) Patient left: in chair;with call bell/phone within reach Nurse Communication: Mobility status PT Visit Diagnosis: Muscle weakness (generalized) (M62.81);Difficulty in walking, not elsewhere classified (R26.2)     Time: 1601-0932 PT Time Calculation (min) (ACUTE ONLY): 25 min  Charges:  $Gait Training: 23-37 mins                     [place] Pager [phone] Office [phone]    Claretha Cooper 04/26/2019, 4:32 PM

## 2019-04-26 NOTE — Progress Notes (Signed)
Nutrition Follow-up RD working remotely.  DOCUMENTATION CODES:   Not applicable  INTERVENTION:   Continue Ensure Enlive po BID, each supplement provides 350 kcal and 20 grams of protein  Pt receiving Hormel Shake daily with Breakfast which provides 520 kcals and 22 g of protein and Magic cup BID with lunch and dinner, each supplement provides 290 kcal and 9 grams of protein, automatically on meal trays to optimize nutritional intake.   NUTRITION DIAGNOSIS:   Increased nutrient needs related to acute illness(COVID-19) as evidenced by estimated needs.  Ongoing  GOAL:   Patient will meet greater than or equal to 90% of their needs  Progressing  MONITOR:   Vent status, TF tolerance, Labs, Weight trends  ASSESSMENT:   48 yo [place] male with no known PMH who was admitted with COVID-19 infection. He lives with several family members who are also sick.  Patient is improving slowly per review of progress notes. He remains on a regular diet, consuming 50-100% of meals. He is being offered Ensure Enlive supplements BID.  Labs reviewed. Sodium 133 (L) CBG's: 130-112-107 Medications reviewed.  Hopeful for d/c home later this week.  Diet Order:   Diet Order            Diet regular Room service appropriate? Yes; Fluid consistency: Thin  Diet effective now              EDUCATION NEEDS:   No education needs have been identified at this time  Skin:  Skin Assessment: Reviewed RN Assessment  Last BM:  6/5  Height:   Ht Readings from Last 1 Encounters:  04/08/19 5\' 3"  (1.6 m)    Weight:   Wt Readings from Last 1 Encounters:  04/26/19 66.8 kg    Ideal Body Weight:  56.4 kg  BMI:  Body mass index is 26.09 kg/m.  Estimated Nutritional Needs:   Kcal:  3762-8315  Protein:  90-120 gm  Fluid:  2 L    Melvin Mills, RD, LDN, [place] Pager [phone] After Hours Pager [phone]

## 2019-04-26 NOTE — Progress Notes (Signed)
CardioVascular Research Department and AHF Team  ReDS Research Project   Patient #: [id number]  ReDS Measurement  Right: low quality x 3  Left: 41 %

## 2019-04-26 NOTE — Progress Notes (Signed)
PROGRESS NOTE    Holly BodilyY'Niang Hirth  ZOX:[id number] DOB: 09/29/1971 DOA: 04/08/2019 PCP: Grayce SessionsEdwards, [name], NP    Brief Narrative:  [name] is a 48yo with no significant PMHx who presented with 1 week fever, SOB, productive cough.  In the ER, CXR showed bilateral infiltrates.  SARS-CoV-2 NAA positive.  Admitted to floor. Progressively hypoxic 5/30 requiring intubation.  Extubated 6/1, developed respiratory distress and new onset Afib and was reintubated.   Subjective: Patient's niece was able to interpret.  Patient continues to feel tired although he states that he has been improving slowly.  Not as short of breath as he was previously.    Assessment & Plan:  Acute hypoxic respiratory failure due to COVID-19/viral pneumonia  Patient has been very slow to improve.  He is down to 2 to 3 L of oxygen by nasal cannula.  Saturating in the early 90s.  He does tend to desaturate with minimal exertion.  However his exercise always appears to have improved some.  Continue incentive spirometry and prone positioning whenever he is in the bed.  Will need to make sure that he is able to ambulate some without a significant drop in his oxygen saturations and significant dyspnea.  Once he is able to do that he should be safe for discharge home.  Does show improvement in bilateral infiltrates which is very reassuring.  CRP remains elevated.  Appears to have plateaued.  Additional dose of Lasix today.  - S/p convalescent plasma 6/1.   - S/p Azithro and ceftriaxone x5d. - S/p remdesivir 5/30 to 6/3 -Not considered candidate for Actemra due to abnormal LFTs.  Brief episode of paroxysmal atrial fibrillation  This occurred in the intensive care unit after he was extubated the first time.  Presumably due to respiratory distress.  Has not had any further episodes since then.  His chads 2 vascular score is 0.  He remains in sinus rhythm.  He was noted to have low TSH with normal T4.  Will recommend repeating  thyroid function tests in a few weeks.  Continue aspirin and metoprolol.  Echocardiogram in the outpatient setting.    Hypokalemia/Sodium abnormalities Electrolytes have been stable the last several days.    Hyperglycemia HbA1c 6.2.  Lantus was discontinued due to low blood glucose levels.  Continue SSI only.  CBGs are stable.   Transaminitis Mildly abnormal LFTs noted.  Monitor periodically.  Urinary retention Appears to have resolved.  Foley catheter was discontinued.  Flomax.  Bladder scan PRN.    DVT prophylaxis: Lovenox Code Status: FULL Family communication: Discussed with his daughter on a daily basis. Disposition: Continue to mobilize.  PT to reevaluate today.  Hopefully discharge sometime this week.  Will most likely need home oxygen.   Consultants:   PCCM  Procedures:   5/30 ETT  6/1 extubation  6/1 ETT x2 (reintubation)  6/5 extubation  Antimicrobials:   Azithromycin 5/29>> 6/1  Ceftriaxone 5/28>> 6/1  Remdesivir 5/30 >> 6/3  Culture data:   5/28 blood culture x1 -- NG  5/30 respiratory culture --NG  MRSA nares negative     Objective: Vitals:   04/26/19 0000 04/26/19 0455 04/26/19 0756 04/26/19 0800  BP: 95/73  104/77 103/73  Pulse:   99   Resp: (!) 21  (!) 27 (!) 26  Temp: 99 F (37.2 C)  98.5 F (36.9 C)   TempSrc: Oral  Oral   SpO2: 97%  93% 93%  Weight:  66.8 kg  Height:        Intake/Output Summary (Last 24 hours) at 04/26/2019 0957 Last data filed at 04/26/2019 0757 Gross per 24 hour  Intake 720 ml  Output 1200 ml  Net -480 ml   Filed Weights   04/24/19 0440 04/25/19 0406 04/26/19 0455  Weight: 65 kg 65.8 kg 66.8 kg    Examination:  General appearance: Awake alert.  In no distress Resp: Normal effort at rest.  Improved aeration bilaterally.  No wheezing rales or rhonchi. Cardio: S1-S2 is normal regular.  No S3-S4.  No rubs murmurs or bruit GI: Abdomen is soft.  Nontender nondistended.  Bowel sounds are present  normal.  No masses organomegaly Extremities: No edema.  Full range of motion of lower extremities. Neurologic: Alert and oriented x3.  No focal neurological deficits.     Data Reviewed: I have personally reviewed following labs and imaging studies:  CBC: Recent Labs  Lab 04/21/19 0317 04/22/19 0400 04/23/19 0300 04/24/19 0400 04/26/19 0413  WBC 7.6 10.4 12.6* 12.7* 7.2  NEUTROABS  --   --   --   --  5.6  HGB 12.5* 12.3* 12.8* 12.3* 12.0*  HCT 38.5* 39.2 39.8 38.6* 38.5*  MCV 84.2 84.1 84.1 84.1 84.2  PLT 203 227 252 307 443   Basic Metabolic Panel: Recent Labs  Lab 04/21/19 0317 04/22/19 0400 04/23/19 0300 04/24/19 0400 04/26/19 0413  NA 133* 134* 135 133* 133*  K 3.8 3.7 3.9 4.3 4.0  CL 97* 96* 95* 95* 93*  CO2 26 28 26 28 28   GLUCOSE 90 108* 121* 107* 96  BUN 22* 20 26* 24* 19  CREATININE 0.92 0.90 0.94 0.96 0.98  CALCIUM 8.4* 8.5* 8.5* 8.8* 8.7*   GFR: Estimated Creatinine Clearance: 72.6 mL/min (by C-G formula based on SCr of 0.98 mg/dL). Liver Function Tests: Recent Labs  Lab 04/21/19 0317 04/22/19 0400 04/23/19 0300 04/24/19 0400 04/26/19 0413  AST 126* 79* 61* 41 30  ALT 117* 132* 121* 92* 66*  ALKPHOS 59 70 82 84 74  BILITOT 0.7 0.6 0.3 0.5 0.5  PROT 6.3* 6.6 7.1 7.3 7.1  ALBUMIN 2.4* 2.5* 2.6* 2.7* 2.6*   CBG: Recent Labs  Lab 04/25/19 0746 04/25/19 1118 04/25/19 1739 04/25/19 2032 04/26/19 0754  GLUCAP 109* 121* 130* 112* 107*     No results found for this or any previous visit (from the past 240 hour(s)).       Radiology Studies: Dg Chest Port 1 View  Result Date: 04/26/2019 CLINICAL DATA:  Pneumonia secondary to COVID-19. EXAM: PORTABLE CHEST 1 VIEW COMPARISON:  Radiographs dated 04/08/2019 through 04/21/2019 FINDINGS: There has been further clearing of the diffuse bilateral pulmonary infiltrates since the study of 04/21/2019. The heart size and pulmonary vascularity are normal. No effusions. No significant bony abnormality.  IMPRESSION: Further improvement in the diffuse bilateral pulmonary infiltrates. Electronically Signed   By: Lorriane Shire M.D.   On: 04/26/2019 08:08     Scheduled Meds: . aspirin  325 mg Oral Daily  . Chlorhexidine Gluconate Cloth  6 each Topical Daily  . dextromethorphan  30 mg Oral BID   And  . guaiFENesin  15 mL Oral Q6H  . enoxaparin (LOVENOX) injection  40 mg Subcutaneous Q12H  . feeding supplement (ENSURE ENLIVE)  237 mL Oral BID BM  . insulin aspart  0-15 Units Subcutaneous TID WC  . mouth rinse  15 mL Mouth Rinse BID  . metoprolol tartrate  12.5 mg Oral BID  . multivitamin  15 mL Oral Daily  . polyethylene glycol  17 g Oral Daily  . sodium chloride flush  3 mL Intravenous Q12H  . tamsulosin  0.4 mg Oral QPC breakfast   Continuous Infusions: . sodium chloride Stopped (04/16/19 2015)     LOS: 18 days   Osvaldo ShipperGokul Zacariah Belue, MD [facility] 04/26/2019, 9:57 AM     Please page through AMION:  [email or web address] If 7PM-7AM, please contact night-coverage

## 2019-04-27 LAB — BASIC METABOLIC PANEL
Anion gap: 14 (ref 5–15)
BUN: 23 mg/dL — ABNORMAL HIGH (ref 6–20)
CO2: 28 mmol/L (ref 22–32)
Calcium: 8.7 mg/dL — ABNORMAL LOW (ref 8.9–10.3)
Chloride: 92 mmol/L — ABNORMAL LOW (ref 98–111)
Creatinine, Ser: 0.98 mg/dL (ref 0.61–1.24)
GFR calc Af Amer: >60 mL/min (ref >60)
GFR calc non Af Amer: >60 mL/min (ref >60)
Glucose, Bld: 98 mg/dL (ref 70–99)
Potassium: 3.6 mmol/L (ref 3.5–5.1)
Sodium: 134 mmol/L — ABNORMAL LOW (ref 135–145)

## 2019-04-27 LAB — GLUCOSE, CAPILLARY
Glucose-Capillary: 139 mg/dL — ABNORMAL HIGH (ref 70–99)
Glucose-Capillary: 102 mg/dL — ABNORMAL HIGH (ref 70–99)
Glucose-Capillary: 159 mg/dL — ABNORMAL HIGH (ref 70–99)
Glucose-Capillary: 93 mg/dL (ref 70–99)

## 2019-04-27 MED ORDER — POTASSIUM CHLORIDE CRYS ER 20 MEQ PO TBCR
40.0000 meq | EXTENDED_RELEASE_TABLET | Freq: Once | ORAL | Status: AC
  Administered 2019-04-27: 40 meq via ORAL
  Filled 2019-04-27: qty 2

## 2019-04-27 MED ORDER — FUROSEMIDE 10 MG/ML IJ SOLN
40.0000 mg | Freq: Once | INTRAMUSCULAR | Status: AC
  Administered 2019-04-27: 10:00:00 40 mg via INTRAVENOUS
  Filled 2019-04-27: qty 4

## 2019-04-27 NOTE — Progress Notes (Signed)
Occupational Therapy Treatment Patient Details Name: Tavita Eastham MRN: [id number] DOB: [birth year] Today's Date: 04/27/2019    History of present illness 48 y.o. male admitted on 04/08/19 with fever.  Pt dx with COVID-19.  Pt became progressively hypoxic requiring intubation 04/10/19 attempted extubation 04/12/19, but developed new onset a-fib and respiratory distress and had to be re-intubated on the same day.  Extubated 6/5. Pt with no other listed PMH on file.   Pt also with hypokalemia and hyperglycemia.     OT comments  Pt making steady progress. Pt able to complete ADL session at sink level in bathroom on 2L. Max HR 141; RR in 40s with SpO2 90s on 2L. Educating pt on energy conservation in order to complete ADL. Pt very appreciative and said he was "proud of himself". 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 States his wife lost her job because of layoffs from [employer] pandemic. Pt able to pull 269ml on IS. Continues to feel SOB. Str[employer] interpreter used during session. Will continue to follow.   Follow Up Recommendations  Home health OT;Supervision/Assistance - 24 hour    Equipment Recommendations  3 in 1 bedside commode    Recommendations for Other Services      Precautions / Restrictions Precautions Precautions: Fall Precaution Comments: monitor vitals, increased WOB with activity       Mobility Bed Mobility              General bed mobility comments: OOB in chair  Transfers Overall transfer level: Needs assistance Equipment used: Transfers: Sit to/from [employer] Transfers Sit to Stand: Supervision Stand pivot transfers: Supervision            Balance     Sitting balance-Leahy Scale: Normal       Standing balance-Leahy Scale: Fair                             ADL either performed or assessed with clinical judgement   ADL Overall ADL's : Needs assistance/impaired     Grooming: Set up;Standing   Upper Body  Bathing: Set up;Sitting   Lower Body Bathing: Set up;Supervison/ safety   Upper Body Dressing : Set up;Sitting;Supervision/safety   Lower Body Dressing: Min guard;Sit to/from stand   Toilet Transfer: Min guard;Ambulation   Toileting- Clothing Manipulation and Hygiene: Supervision/safety;Sit to/from stand       Functional mobility during ADLs: Min guard General ADL Comments: ADL completed at sink level in bathroom. Sat on BSC majority of session     Vision       Perception     Praxis      Cognition Arousal/Alertness: Awake/alert Behavior During Therapy: WFL for tasks assessed/performed Overall Cognitive Status: Within Functional Limits for tasks assessed                                          Exercises Other Exercises Other Exercises: incentive spirometer; able to pull 200 ml   Shoulder Instructions       General Comments      Pertinent Vitals/ Pain       Pain Assessment: 0-10 Pain Score: 2  Pain Location: chest with increased WOB Pain Descriptors / Indicators: Discomfort Pain Intervention(s): Limited activity within patient's tolerance  Home Living  Prior Functioning/Environment              Frequency  Min 3X/week        Progress Toward Goals  OT Goals(current goals can now be found in the care plan section)  Progress towards OT goals: Progressing toward goals  Acute Rehab OT Goals Patient Stated Goal: to get better OT Goal Formulation: With patient Time For Goal Achievement: 04/29/19 Potential to Achieve Goals: Good ADL Goals Pt Will Perform Upper Body Bathing: with set-up;sitting Pt Will Perform Lower Body Bathing: with min guard assist;sit to/from stand Pt Will Perform Upper Body Dressing: with set-up;sitting Pt Will Perform Lower Body Dressing: with min guard assist;sit to/from stand Pt Will Transfer to Toilet: with supervision;regular height  toilet;ambulating Pt/caregiver will Perform Home Exercise Program: Increased strength;Both right and left upper extremity;With written HEP provided;With theraband  Plan Discharge plan remains appropriate    Co-evaluation                 AM-PAC OT "6 Clicks" Daily Activity     Outcome Measure   Help from another person eating meals?: None Help from another person taking care of personal grooming?: A Little Help from another person toileting, which includes using toliet, bedpan, or urinal?: A Little Help from another person bathing (including washing, rinsing, drying)?: A Little Help from another person to put on and taking off regular upper body clothing?: A Little Help from another person to put on and taking off regular lower body clothing?: A Little 6 Click Score: 19    End of Session Equipment Utilized During Treatment: Oxygen(2L)  OT Visit Diagnosis: Unsteadiness on feet (R26.81);Muscle weakness (generalized) (M62.81);Other abnormalities of gait and mobility (R26.89)   Activity Tolerance Patient tolerated treatment well   Patient Left in chair;with call bell/phone within reach   Nurse Communication Mobility status        Time: [id number] OT Time Calculation (min): 39 min  Charges: OT General Charges $OT Visit: 1 Visit OT Treatments $Self Care/Home Management : 38-52 mins  Luisa DagoHilary Lael Wetherbee, OT/L   Acute OT Clinical Specialist Acute Rehabilitation Services Pager [phone] Office [phone]    [facility] 04/27/2019, 6:47 PM

## 2019-04-27 NOTE — Progress Notes (Signed)
Physical Therapy Treatment Patient Details Name: Melvin Mills MRN: [id number] DOB: 1971/10/18 Today's Date: 04/27/2019    History of Present Illness 48 y.o. male admitted on 04/08/19 with fever.  Pt dx with COVID-19.  Pt became progressively hypoxic requiring intubation 04/10/19 attempted extubation 04/12/19, but developed new onset a-fib and respiratory distress and had to be re-intubated on the same day.  Extubated 6/5. Pt with no other listed PMH on file.   Pt also with hypokalemia and hyperglycemia.      PT Comments     The patient is slowly improving in tolerance to activity, Ambulated on 2 L with SaO2 85-89%( 94% at rest(. HR 110's, RR up 41. Continue PT.   Follow Up Recommendations  Home health PT;Supervision for mobility/OOB     Equipment Recommendations  None recommended by PT    Recommendations for Other Services       Precautions / Restrictions Precautions Precaution Comments: monitor vitals, increased WOB with activity    Mobility  Bed Mobility Overal bed mobility: Independent                Transfers Overall transfer level: Needs assistance Equipment used: Rolling walker (2 wheeled) Transfers: Sit to/from Stand Sit to Stand: Supervision            Ambulation/Gait Ambulation/Gait assistance: Min guard Gait Distance (Feet): 100 Feet Assistive device: Rolling walker (2 wheeled) Gait Pattern/deviations: Step-through pattern     General Gait Details: patient is steady  with RW, noted SOB at end of  walk, Sats 885-89% on 2 liters, RR 41,  HR 110   Stairs             Wheelchair Mobility    Modified Rankin (Stroke Patients Only)       Balance                                            Cognition Arousal/Alertness: Awake/alert                                            Exercises      General Comments        Pertinent Vitals/Pain Pain Assessment: No/denies pain    Home Living                      Prior Function            PT Goals (current goals can now be found in the care plan section)      Frequency    Min 3X/week      PT Plan Current plan remains appropriate    Co-evaluation              AM-PAC PT "6 Clicks" Mobility   Outcome Measure  Help needed turning from your back to your side while in a flat bed without using bedrails?: None Help needed moving from lying on your back to sitting on the side of a flat bed without using bedrails?: None Help needed moving to and from a bed to a chair (including a wheelchair)?: A Little Help needed standing up from a chair using your arms (e.g., wheelchair or bedside chair)?: A Little Help needed to walk in hospital room?: A Little Help needed climbing  3-5 steps with a railing? : A Little 6 Click Score: 20    End of Session Equipment Utilized During Treatment: Oxygen Activity Tolerance: Treatment limited secondary to medical complications (Comment) Patient left: in chair;with call bell/phone within reach Nurse Communication: Mobility status PT Visit Diagnosis: Muscle weakness (generalized) (M62.81);Difficulty in walking, not elsewhere classified (R26.2)     Time: [id number] PT Time Calculation (min) (ACUTE ONLY): 25 min  Charges:  $Gait Training: 23-37 mins                     Blanchard KelchKaren Asna Muldrow PT Acute Rehabilitation Services Pager [phone] Office [phone]    Rada [place]Hill, Regena Delucchi Elizabeth 04/27/2019, 5:36 PM

## 2019-04-27 NOTE — Progress Notes (Signed)
PROGRESS NOTE    Melvin Mills  ZJQ:[id number] DOB: 07/07/1971 DOA: 04/08/2019 PCP: Melvin Perna, NP    Brief Narrative:  Melvin Mills is a 48yo with no significant PMHx who presented with 1 week fever, SOB, productive cough.  In the ER, CXR showed bilateral infiltrates.  SARS-CoV-2 NAA positive.  Admitted to floor. Progressively hypoxic 5/30 requiring intubation.  Extubated 6/1, developed respiratory distress and new onset Afib and was reintubated.   Subjective: Patient's family member was able to interpret.  Patient mentions that he was able to walk yesterday with physical therapy but did get short of breath.  But not as short of breath as he was getting previously.  According to the PT notes patient did have significant dyspnea with desaturation.     Assessment & Plan:  Acute hypoxic respiratory failure due to COVID-19/viral pneumonia  Patient has been slow to improve.  However he has made significant progress in the last week or so.  He is down to 2 to 3 L of oxygen by nasal cannula.  Exercise tolerance is still poor.  Tends to desaturate quickly with exertion and develops dyspnea.  He received a dose of Lasix yesterday with good diuresis.  Chest x-ray done yesterday showed improvement in bilateral infiltrates.  Lasix appears to be helping. We will give additional dose of Lasix today.  CRP appears to have plateaued.  Will not check it any further as patient has improved clinically.  Continue incentive spirometry prone positioning as much as possible.  - S/p convalescent plasma 6/1.   - S/p Azithro and ceftriaxone x5d. - S/p remdesivir 5/30 to 6/3 -Not considered candidate for Actemra due to abnormal LFTs.  Brief episode of paroxysmal atrial fibrillation  This occurred in the intensive care unit after he was extubated the first time.  Presumably due to respiratory distress. Has not had any further episodes since then.  His chads 2 vascular score is 0.  He remains in sinus  rhythm.  He was noted to have low TSH with normal T4.  Will recommend repeating thyroid function tests in a few weeks.  Continue aspirin and metoprolol.  Echocardiogram in the outpatient setting.    Hypokalemia/Sodium abnormalities Electrolytes have been stable the last several days.  Give a dose of potassium with Lasix today.  Hyperglycemia HbA1c 6.2.  Lantus was discontinued due to low blood glucose levels.  Continue SSI only.  CBGs are stable.   Transaminitis Mildly abnormal LFTs were noted.  Monitor periodically.  Urinary retention Appears to have resolved.  Foley catheter was discontinued.  Flomax.  Bladder scan PRN.    DVT prophylaxis: Lovenox Code Status: FULL Family communication: Discussed with his daughter on a daily basis. Disposition: Patient continues to make progress.  Hopefully home before the end of this week.  Will most likely need home oxygen at discharge.     Consultants:   PCCM  Procedures:   5/30 ETT  6/1 extubation  6/1 ETT x2 (reintubation)  6/5 extubation  Antimicrobials:   Azithromycin 5/29>> 6/1  Ceftriaxone 5/28>> 6/1  Remdesivir 5/30 >> 6/3  Culture data:   5/28 blood culture x1 -- NG  5/30 respiratory culture --NG  MRSA nares negative     Objective: Vitals:   04/27/19 0500 04/27/19 0521 04/27/19 0739 04/27/19 0816  BP:  105/78  115/87  Pulse:  89 96   Resp:      Temp:  98 F (36.7 C)  98.6 F (37 C)  TempSrc:  Oral  Oral  SpO2:   96%   Weight: 65.2 kg     Height:        Intake/Output Summary (Last 24 hours) at 04/27/2019 0905 Last data filed at 04/27/2019 0523 Gross per 24 hour  Intake 240 ml  Output 1425 ml  Net -1185 ml   Filed Weights   04/25/19 0406 04/26/19 0455 04/27/19 0500  Weight: 65.8 kg 66.8 kg 65.2 kg    Examination:  General appearance: Awake alert.  In no distress Resp: Mildly tachypneic at rest.  Improved aeration bilaterally on auscultation.  No wheezing or rhonchi.  Few crackles at the  bases.   Cardio: S1-S2 is normal regular.  No S3-S4.  No rubs murmurs or bruit GI: Abdomen is soft.  Nontender nondistended.  Bowel sounds are present normal.  No masses organomegaly Extremities: No edema.  Full range of motion of lower extremities. Neurologic: No focal neurological deficits.     Data Reviewed: I have personally reviewed following labs and imaging studies:  CBC: Recent Labs  Lab 04/21/19 0317 04/22/19 0400 04/23/19 0300 04/24/19 0400 04/26/19 0413  WBC 7.6 10.4 12.6* 12.7* 7.2  NEUTROABS  --   --   --   --  5.6  HGB 12.5* 12.3* 12.8* 12.3* 12.0*  HCT 38.5* 39.2 39.8 38.6* 38.5*  MCV 84.2 84.1 84.1 84.1 84.2  PLT 203 227 252 307 274   Basic Metabolic Panel: Recent Labs  Lab 04/22/19 0400 04/23/19 0300 04/24/19 0400 04/26/19 0413 04/27/19 0304  NA 134* 135 133* 133* 134*  K 3.7 3.9 4.3 4.0 3.6  CL 96* 95* 95* 93* 92*  CO2 28 26 28 28 28   GLUCOSE 108* 121* 107* 96 98  BUN 20 26* 24* 19 23*  CREATININE 0.90 0.94 0.96 0.98 0.98  CALCIUM 8.5* 8.5* 8.8* 8.7* 8.7*   GFR: Estimated Creatinine Clearance: 72.6 mL/min (by C-G formula based on SCr of 0.98 mg/dL). Liver Function Tests: Recent Labs  Lab 04/21/19 0317 04/22/19 0400 04/23/19 0300 04/24/19 0400 04/26/19 0413  AST 126* 79* 61* 41 30  ALT 117* 132* 121* 92* 66*  ALKPHOS 59 70 82 84 74  BILITOT 0.7 0.6 0.3 0.5 0.5  PROT 6.3* 6.6 7.1 7.3 7.1  ALBUMIN 2.4* 2.5* 2.6* 2.7* 2.6*   CBG: Recent Labs  Lab 04/26/19 0754 04/26/19 1240 04/26/19 1634 04/26/19 2214 04/27/19 0814  GLUCAP 107* 170* 136* 131* 139*     No results found for this or any previous visit (from the past 240 hour(s)).       Radiology Studies: Dg Chest Port 1 View  Result Date: 04/26/2019 CLINICAL DATA:  Pneumonia secondary to COVID-19. EXAM: PORTABLE CHEST 1 VIEW COMPARISON:  Radiographs dated 04/08/2019 through 04/21/2019 FINDINGS: There has been further clearing of the diffuse bilateral pulmonary infiltrates  since the study of 04/21/2019. The heart size and pulmonary vascularity are normal. No effusions. No significant bony abnormality. IMPRESSION: Further improvement in the diffuse bilateral pulmonary infiltrates. Electronically Signed   By: Francene BoyersJames  Maxwell M.D.   On: 04/26/2019 08:08     Scheduled Meds: . aspirin  325 mg Oral Daily  . Chlorhexidine Gluconate Cloth  6 each Topical Daily  . dextromethorphan  30 mg Oral BID   And  . guaiFENesin  15 mL Oral Q6H  . enoxaparin (LOVENOX) injection  40 mg Subcutaneous Q12H  . feeding supplement (ENSURE ENLIVE)  237 mL Oral BID BM  . insulin aspart  0-15 Units Subcutaneous TID WC  .  mouth rinse  15 mL Mouth Rinse BID  . metoprolol tartrate  12.5 mg Oral BID  . multivitamin  15 mL Oral Daily  . polyethylene glycol  17 g Oral Daily  . sodium chloride flush  3 mL Intravenous Q12H  . tamsulosin  0.4 mg Oral QPC breakfast   Continuous Infusions: . sodium chloride Stopped (04/16/19 2015)     LOS: 19 days   Osvaldo ShipperGokul Jaecion Dempster, MD [facility] 04/27/2019, 9:05 AM     Please page through AMION:  [email or web address] If 7PM-7AM, please contact night-coverage

## 2019-04-27 NOTE — Progress Notes (Signed)
CardioVascular Research Department and AHF Team  ReDS Research Project   Patient #: [id number]  ReDS Measurement  Right: 30 %  Left: 27 %

## 2019-04-28 LAB — BASIC METABOLIC PANEL
Anion gap: 13 (ref 5–15)
BUN: 24 mg/dL — ABNORMAL HIGH (ref 6–20)
CO2: 29 mmol/L (ref 22–32)
Calcium: 8.9 mg/dL (ref 8.9–10.3)
Chloride: 93 mmol/L — ABNORMAL LOW (ref 98–111)
Creatinine, Ser: 1.04 mg/dL (ref 0.61–1.24)
GFR calc Af Amer: >60 mL/min (ref >60)
GFR calc non Af Amer: >60 mL/min (ref >60)
Glucose, Bld: 104 mg/dL — ABNORMAL HIGH (ref 70–99)
Potassium: 3.5 mmol/L (ref 3.5–5.1)
Sodium: 135 mmol/L (ref 135–145)

## 2019-04-28 LAB — GLUCOSE, CAPILLARY
Glucose-Capillary: 108 mg/dL — ABNORMAL HIGH (ref 70–99)
Glucose-Capillary: 145 mg/dL — ABNORMAL HIGH (ref 70–99)
Glucose-Capillary: 92 mg/dL (ref 70–99)

## 2019-04-28 NOTE — Progress Notes (Deleted)
Per patient's son, Melvin Mills, family requests that the patient's Bible be transported with her body to [facility].  Patient's Bible has been placed in a clean, labeled patient belonging bag x2 and placed inside the body bag, next to patient to ensure transport to funeral home.   

## 2019-04-28 NOTE — Progress Notes (Signed)
Occupational Therapy Treatment Patient Details Name: Melvin Mills MRN: [id number] DOB: 06/24/1971 Today's Date: 04/28/2019    History of present illness 48 y.o. male admitted on 04/08/19 with fever.  Pt dx with COVID-19.  Pt became progressively hypoxic requiring intubation 04/10/19 attempted extubation 04/12/19, but developed new onset a-fib and respiratory distress and had to be re-intubated on the same day.  Extubated 6/5. Pt with no other listed PMH on file.   Pt also with hypokalemia and hyperglycemia.     OT comments  [employer] Interpreter Chilton Sighia [phone] used during session. Pt completed ADL @ sink level on RA. Able to stand x 15 min to shave with lowest SpO2 89; HR 124. RR much improved during ADL with improving activity tolerance. Pt had nose bleed at beginning of session. Nsg aware. Pt pleased with progress and very appreciative. Encouraged use of IS. Will continue to follow.   Follow Up Recommendations  Home health OT;Supervision/Assistance - 24 hour    Equipment Recommendations  3 in 1 bedside commode(as shower chair)    Recommendations for Other Services      Precautions / Restrictions Precautions Precautions: Fall Precaution Comments: monitor vitals, increased WOB with activity       Mobility Bed Mobility Overal bed mobility: Independent                Transfers Overall transfer level: Needs assistance   Transfers: Sit to/from Stand Sit to Stand: Supervision Stand pivot transfers: Supervision            Balance     Sitting balance-Leahy Scale: Normal       Standing balance-Leahy Scale: Good                             ADL either performed or assessed with clinical judgement   ADL Overall ADL's : Needs assistance/impaired     Grooming: Set up;Standing   Upper Body Bathing: Set up;Sitting   Lower Body Bathing: Set up;Sit to/from stand   Upper Body Dressing : Set up;Sitting   Lower Body Dressing: Supervision/safety;Sit to/from  stand   Toilet Transfer: Supervision/safety;Ambulation           Functional mobility during ADLs: Supervision/safety General ADL Comments: Completed ADL @ sink level. Stood at least 15 min while shaving; 1 seated rest break before bathing     Vision       Perception     Praxis      Cognition Arousal/Alertness: Awake/alert Behavior During Therapy: WFL for tasks assessed/performed Overall Cognitive Status: Within Functional Limits for tasks assessed                                          Exercises Other Exercises Other Exercises: incentive spirometer; able to pull 200 ml   Shoulder Instructions       General Comments      Pertinent Vitals/ Pain       Pain Assessment: No/denies pain  Home Living                                          Prior Functioning/Environment              Frequency  Min 3X/week  Progress Toward Goals  OT Goals(current goals can now be found in the care plan section)  Progress towards OT goals: Progressing toward goals  Acute Rehab OT Goals Patient Stated Goal: to get better OT Goal Formulation: With patient Time For Goal Achievement: 04/29/19 Potential to Achieve Goals: Good ADL Goals Pt Will Perform Upper Body Bathing: with set-up;sitting Pt Will Perform Lower Body Bathing: with min guard assist;sit to/from stand Pt Will Perform Upper Body Dressing: with set-up;sitting Pt Will Perform Lower Body Dressing: with min guard assist;sit to/from stand Pt Will Transfer to Toilet: with supervision;regular height toilet;ambulating Pt/caregiver will Perform Home Exercise Program: Increased strength;Both right and left upper extremity;With written HEP provided;With theraband  Plan Discharge plan remains appropriate    Co-evaluation                 AM-PAC OT "6 Clicks" Daily Activity     Outcome Measure   Help from another person eating meals?: None Help from another person  taking care of personal grooming?: None Help from another person toileting, which includes using toliet, bedpan, or urinal?: A Little Help from another person bathing (including washing, rinsing, drying)?: A Little Help from another person to put on and taking off regular upper body clothing?: None Help from another person to put on and taking off regular lower body clothing?: A Little 6 Click Score: 21    End of Session    OT Visit Diagnosis: Unsteadiness on feet (R26.81);Muscle weakness (generalized) (M62.81);Other abnormalities of gait and mobility (R26.89)   Activity Tolerance Patient tolerated treatment well   Patient Left in chair;with call bell/phone within reach   Nurse Communication Mobility status        Time: 1200-1252 OT Time Calculation (min): 52 min  Charges: OT General Charges $OT Visit: 1 Visit OT Treatments $Self Care/Home Management : 38-52 mins  Maurie Boettcher, OT/L   Acute OT Clinical Specialist [place] Pager [phone] Office [phone]     [facility] 04/28/2019, 1:01 PM

## 2019-04-28 NOTE — Progress Notes (Signed)
[place] TEAM 1 - Stepdown/ICU TEAM  Melvin Melvin Mills  XBJ:[id number] DOB: 1971-07-03 DOA: 04/08/2019 PCP: Kerin Perna, NP    Brief Narrative:  [phone] with no significant med hx who presented with 1 week of fever, SOB, and productive cough. In the ER a CXR showed bilateral infiltrates and the pt was SARS-CoV-2 positive.  After admission he suffered progressive hypoxia and required intubation 5/30. After extubation 6/1 he developed recurrent respiratory distress and new onset Afib and was reintubated.  Significant Events: 5/30 intubated 6/1 extubated   COVID-19 specific Treatment: convalescent plasma 6/1 Remdesivir 5/30 > 6/3 No Actemra due to abnormal LFTs  Subjective: Up working w/ PT. Some SOB w/ exertion, but otherwise looks very good.   Assessment & Plan:  Acute hypoxic respiratory failure due to COVID-19 pneumonia  exercise tolerance is poor - appears to be responding well to lasix - cont to diurese - cont to work w/ PT/OT   Brief episode paroxysmal atrial fibrillation w RVR occurred in the ICU after extubated the first time - no further episodes since - CHA2DS2 VASc score is 0 - remains in sinus rhythm - low TSH but with normal T4 - continue aspirin and metoprolol - TTE in f/u   Hyperglycemia A1c 6.2 - CBG now controlled    Transaminitis Essentially resolved as of 6/15 - f/u in AM   Urinary retention Resolved - Foley catheter discontinued - cont Flomax  DVT prophylaxis: lovenox  Code Status: FULL CODE Family Communication:  Disposition Plan: D/C home   Consultants:  none  Antimicrobials:  none  Objective: Melvin [name] 114/89, pulse (!) 111, temperature 98 F (36.7 C), temperature source Oral, resp. rate (!) 29, height 5\' 3"  (1.6 m), weight 65.9 kg, SpO2 93 %.  Intake/Output Summary (Last 24 hours) at 04/28/2019 0941 Last data filed at 04/28/2019 0853 Gross per 24 hour  Intake 1200 ml  Output 950 ml  Net 250 ml   Filed Weights   04/26/19 0455  04/27/19 0500 04/28/19 0404  Weight: 66.8 kg 65.2 kg 65.9 kg    Examination: General: No acute respiratory distress Lungs: mild bibasilar crackles - no wheezing  Cardiovascular: Regular rate and rhythm without murmur gallop or rub normal S1 and S2 Abdomen: Nontender, nondistended, soft, bowel sounds positive, no rebound, no ascites, no appreciable mass Extremities: No significant cyanosis, clubbing, or edema bilateral lower extremities  CBC: Recent Labs  Lab 04/23/19 0300 04/24/19 0400 04/26/19 0413  WBC 12.6* 12.7* 7.2  NEUTROABS  --   --  5.6  HGB 12.8* 12.3* 12.0*  HCT 39.8 38.6* 38.5*  MCV 84.1 84.1 84.2  PLT 252 307 578   Basic Metabolic Panel: Recent Labs  Lab 04/26/19 0413 04/27/19 0304 04/28/19 0326  NA 133* 134* 135  K 4.0 3.6 3.5  CL 93* 92* 93*  CO2 28 28 29   GLUCOSE 96 98 104*  BUN 19 23* 24*  CREATININE 0.98 0.98 1.04  CALCIUM 8.7* 8.7* 8.9   GFR: Estimated Creatinine Clearance: 68.4 mL/min (by C-G formula based on SCr of 1.04 mg/dL).  Liver Function Tests: Recent Labs  Lab 04/22/19 0400 04/23/19 0300 04/24/19 0400 04/26/19 0413  AST 79* 61* 41 30  ALT 132* 121* 92* 66*  ALKPHOS 70 82 84 74  BILITOT 0.6 0.3 0.5 0.5  PROT 6.6 7.1 7.3 7.1  ALBUMIN 2.5* 2.6* 2.7* 2.6*    HbA1C: Hgb A1c MFr Bld  Date/Time Value Ref Range Status  04/15/2019 04:11 AM 6.2 (H) 4.8 -  5.6 % Final    Comment:    (NOTE) Pre diabetes:          5.7%-6.4% Diabetes:              >6.4% Glycemic control for   <7.0% adults with diabetes     CBG: Recent Labs  Lab 04/27/19 0814 04/27/19 1144 04/27/19 1653 04/27/19 2101 04/28/19 0814  GLUCAP 139* 102* 159* 93 108*     Scheduled Meds: . aspirin  325 mg Oral Daily  . Chlorhexidine Gluconate Cloth  6 each Topical Daily  . dextromethorphan  30 mg Oral BID   And  . guaiFENesin  15 mL Oral Q6H  . enoxaparin (LOVENOX) injection  40 mg Subcutaneous Q12H  . feeding supplement (ENSURE ENLIVE)  237 mL Oral BID BM   . insulin aspart  0-15 Units Subcutaneous TID WC  . mouth rinse  15 mL Mouth Rinse BID  . metoprolol tartrate  12.5 mg Oral BID  . multivitamin  15 mL Oral Daily  . polyethylene glycol  17 g Oral Daily  . sodium chloride flush  3 mL Intravenous Q12H  . tamsulosin  0.4 mg Oral QPC breakfast     LOS: 20 days   Melvin BloodJeffrey T. Mann Skaggs, MD [facility] Office  [phone] Pager - Text Page per Amion  If 7PM-7AM, please contact night-coverage per Amion 04/28/2019, 9:41 AM

## 2019-04-28 NOTE — Progress Notes (Signed)
[facility] Department and AHF Team  ReDS Research Project   Patient #: [id number]  ReDS Measurement  Right: 21%  Left: low quality x 3

## 2019-04-28 NOTE — Progress Notes (Signed)
Physical Therapy Treatment Patient Details Name: Melvin Mills MRN: [id number] DOB: 1971-09-22 Today's Date: 04/28/2019    History of Present Illness 48 y.o. male admitted on 04/08/19 with fever.  Pt dx with COVID-19.  Pt became progressively hypoxic requiring intubation 04/10/19 attempted extubation 04/12/19, but developed new onset a-fib and respiratory distress and had to be re-intubated on the same day.  Extubated 6/5. Pt with no other listed PMH on file.   Pt also with hypokalemia and hyperglycemia.      PT Comments    Pt is progressing quickly with gait and mobility.  Supervision overall for gait, no obvious signs of imbalance noted.  He did drop to 87% on RA with HR 124, RR low 40s, but was able to recover with standing rest break and pursed lip breathing back to 90%.  Encouraged pt to walk around his room every hour and do 10 sit to stands every hour for exercise.  PT will continue to follow acutely for safe mobility progression   Follow Up Recommendations  Home health PT;Supervision - Intermittent     Equipment Recommendations  None recommended by PT    Recommendations for Other Services   NA     Precautions / Restrictions Precautions Precautions: Other (comment) Precaution Comments: monitor vitals, increased WOB with activity    Mobility  Bed Mobility Overal bed mobility: Independent             General bed mobility comments: Pt was OOB in the recliner chair.   Transfers Overall transfer level: Needs assistance Equipment used: None Transfers: Sit to/from Stand Sit to Stand: Supervision Stand pivot transfers: Supervision       General transfer comment: supervision for safety, but no signs of imbalance today  Ambulation/Gait Ambulation/Gait assistance: Supervision Gait Distance (Feet): 130 Feet Assistive device: None Gait Pattern/deviations: WFL(Within Functional Limits)     General Gait Details: slow, steady, HR increased to 124, RR 40, O2 sats lowest is  87% on RA.  With standing rest break and pursed lip breathing he is able to get it back to 90%           Balance Overall balance assessment: No apparent balance deficits (not formally assessed)   Sitting balance-Leahy Scale: Normal       Standing balance-Leahy Scale: Good                              Cognition Arousal/Alertness: Awake/alert Behavior During Therapy: WFL for tasks assessed/performed Overall Cognitive Status: Impaired/Different from baseline Area of Impairment: Memory                     Memory: Decreased short-term memory         General Comments: pt unable to recall if today is better than last Friday (how he feels)      Exercises Other Exercises Other Exercises: encouraged walking around the end of the bed and 10 sit to stands every hour.          Pertinent Vitals/Pain Pain Assessment: No/denies pain Pain Score: 0-No pain           PT Goals (current goals can now be found in the care plan section) Acute Rehab PT Goals Patient Stated Goal: to get better PT Goal Formulation: With patient Time For Goal Achievement: 05/12/19 Potential to Achieve Goals: Good Progress towards PT goals: Progressing toward goals    Frequency    Min 3X/week  PT Plan Current plan remains appropriate       AM-PAC PT "6 Clicks" Mobility   Outcome Measure  Help needed turning from your back to your side while in a flat bed without using bedrails?: None Help needed moving from lying on your back to sitting on the side of a flat bed without using bedrails?: None Help needed moving to and from a bed to a chair (including a wheelchair)?: None Help needed standing up from a chair using your arms (e.g., wheelchair or bedside chair)?: None Help needed to walk in hospital room?: None Help needed climbing 3-5 steps with a railing? : A Little 6 Click Score: 23    End of Session   Activity Tolerance: Patient limited by fatigue Patient  left: in chair;with call bell/phone within reach   PT Visit Diagnosis: Muscle weakness (generalized) (M62.81);Difficulty in walking, not elsewhere classified (R26.2)     Time: [id number] PT Time Calculation (min) (ACUTE ONLY): 23 min  Charges:  $Gait Training: 23-37 mins                    Shelbi Vaccaro B. Joslyn Ramos, PT, DPT  Acute Rehabilitation [phone] pager [phone]) [phone] office  @ [facility] [place] [place]: [phone]

## 2019-04-29 LAB — GLUCOSE, CAPILLARY
Glucose-Capillary: 121 mg/dL — ABNORMAL HIGH (ref 70–99)
Glucose-Capillary: 237 mg/dL — ABNORMAL HIGH (ref 70–99)
Glucose-Capillary: 90 mg/dL (ref 70–99)
Glucose-Capillary: 117 mg/dL — ABNORMAL HIGH (ref 70–99)

## 2019-04-29 LAB — CBC WITH DIFFERENTIAL/PLATELET
Abs Immature Granulocytes: 0.04 10*3/uL (ref 0.00–0.07)
Basophils Absolute: 0 10*3/uL (ref 0.0–0.1)
Basophils Relative: 1 %
Eosinophils Absolute: 0.2 10*3/uL (ref 0.0–0.5)
Eosinophils Relative: 5 %
HCT: 37.4 % — ABNORMAL LOW (ref 39.0–52.0)
Hemoglobin: 11.7 g/dL — ABNORMAL LOW (ref 13.0–17.0)
Immature Granulocytes: 1 %
Lymphocytes Relative: 30 %
Lymphs Abs: 1.4 10*3/uL (ref 0.7–4.0)
MCH: 26.4 pg (ref 26.0–34.0)
MCHC: 31.3 g/dL (ref 30.0–36.0)
MCV: 84.2 fL (ref 80.0–100.0)
Monocytes Absolute: 0.8 10*3/uL (ref 0.1–1.0)
Monocytes Relative: 17 %
Neutro Abs: 2.2 10*3/uL (ref 1.7–7.7)
Neutrophils Relative %: 46 %
Platelets: 200 10*3/uL (ref 150–400)
RBC: 4.44 MIL/uL (ref 4.22–5.81)
RDW: 13.1 % (ref 11.5–15.5)
WBC: 4.7 10*3/uL (ref 4.0–10.5)
nRBC: 0 % (ref 0.0–0.2)

## 2019-04-29 LAB — COMPREHENSIVE METABOLIC PANEL
ALT: 58 U/L — ABNORMAL HIGH (ref 0–44)
AST: 43 U/L — ABNORMAL HIGH (ref 15–41)
Albumin: 2.7 g/dL — ABNORMAL LOW (ref 3.5–5.0)
Alkaline Phosphatase: 79 U/L (ref 38–126)
Anion gap: 12 (ref 5–15)
BUN: 22 mg/dL — ABNORMAL HIGH (ref 6–20)
CO2: 29 mmol/L (ref 22–32)
Calcium: 8.8 mg/dL — ABNORMAL LOW (ref 8.9–10.3)
Chloride: 94 mmol/L — ABNORMAL LOW (ref 98–111)
Creatinine, Ser: 1 mg/dL (ref 0.61–1.24)
GFR calc Af Amer: >60 mL/min (ref >60)
GFR calc non Af Amer: >60 mL/min (ref >60)
Glucose, Bld: 93 mg/dL (ref 70–99)
Potassium: 3.5 mmol/L (ref 3.5–5.1)
Sodium: 135 mmol/L (ref 135–145)
Total Bilirubin: <0.1 mg/dL — ABNORMAL LOW (ref 0.3–1.2)
Total Protein: 7.2 g/dL (ref 6.5–8.1)

## 2019-04-29 MED ORDER — FUROSEMIDE 10 MG/ML IJ SOLN
40.0000 mg | Freq: Two times a day (BID) | INTRAMUSCULAR | Status: DC
  Administered 2019-04-29 – 2019-04-30 (×3): 40 mg via INTRAVENOUS
  Filled 2019-04-29 (×3): qty 4

## 2019-04-29 NOTE — Progress Notes (Signed)
[facility] Research Department and AHF Team  ReDS Research Project   Patient #: [id number]  ReDS Measurement  Right: low quality x 3  Left: low quality x 3

## 2019-04-29 NOTE — Progress Notes (Signed)
Physical Therapy Treatment Patient Details Name: Melvin Mills MRN: [id number] DOB: 09/26/1971 Today's Date: 04/29/2019    History of Present Illness 48 y.o. male admitted on 04/08/19 with fever.  Pt dx with COVID-19.  Pt became progressively hypoxic requiring intubation 04/10/19 attempted extubation 04/12/19, but developed new onset a-fib and respiratory distress and had to be re-intubated on the same day.  Extubated 6/5. Pt with no other listed PMH on file.   Pt also with hypokalemia and hyperglycemia.      PT Comments    The patient is progressing very well today. Ambulated x 200' x 2 on RA with SaO2 dropping to 88% briefly. Hr up 120's at times and RR varies from 20's to 40's. Encouraged pursed lip breaths. Continue to ambulate and monitor for supplemental O2 needs.   Follow Up Recommendations  Home health PT(may not need)     Equipment Recommendations  None recommended by PT    Recommendations for Other Services       Precautions / Restrictions Precautions Precaution Comments: monitor vitals,    Mobility  Bed Mobility Overal bed mobility: Independent                Transfers Overall transfer level: Independent                  Ambulation/Gait Ambulation/Gait assistance: Supervision Gait Distance (Feet): 200 Feet(x 2) Assistive device: None Gait Pattern/deviations: WFL(Within Functional Limits) Gait velocity: decreased   General Gait Details: gait is steady, opens, closes door , no balance losses   Stairs             Wheelchair Mobility    Modified Rankin (Stroke Patients Only)       Balance Overall balance assessment: Independent                                          Cognition Arousal/Alertness: Awake/alert                                     General Comments: seems to be more cheerfull      Exercises      General Comments        Pertinent Vitals/Pain Pain Assessment: No/denies pain     Home Living                      Prior Function            PT Goals (current goals can now be found in the care plan section) Progress towards PT goals: Progressing toward goals    Frequency    Min 3X/week      PT Plan Current plan remains appropriate    Co-evaluation              AM-PAC PT "6 Clicks" Mobility   Outcome Measure  Help needed turning from your back to your side while in a flat bed without using bedrails?: None Help needed moving from lying on your back to sitting on the side of a flat bed without using bedrails?: None Help needed moving to and from a bed to a chair (including a wheelchair)?: None Help needed standing up from a chair using your arms (e.g., wheelchair or bedside chair)?: None Help needed to walk in hospital room?: None Help needed  climbing 3-5 steps with a railing? : None 6 Click Score: 24    End of Session Equipment Utilized During Treatment: Oxygen(took tank, did not ned O2) Activity Tolerance: Patient tolerated treatment well Patient left: in chair Nurse Communication: Mobility status PT Visit Diagnosis: Difficulty in walking, not elsewhere classified (R26.2)     Time: 1594-7076 PT Time Calculation (min) (ACUTE ONLY): 33 min  Charges:  $Gait Training: 23-37 mins                      Melvin Mills PT [phone]   Melvin Mills 04/29/2019, 4:09 PM

## 2019-04-29 NOTE — Progress Notes (Signed)
[place] TEAM 1 - Stepdown/ICU TEAM  Melvin Mills  ZOX:[id number] DOB: 08/13/1971 DOA: 04/08/2019 PCP: Grayce SessionsEdwards, [name], NP    Brief Narrative:  [phone] with no significant med hx who presented with 1 week of fever, SOB, and productive cough. In the ER a CXR showed bilateral infiltrates and the pt was SARS-CoV-2 positive.  After admission he suffered progressive hypoxia and required intubation 5/30. After extubation 6/1 he developed recurrent respiratory distress and new onset Afib and was reintubated.  Significant Events: [facility]  5/30 tx to ICU - intubated 6/1 extubated > Afib w/ RVR > re-intubated 6/5 extubated   6/7 tx to PCU  COVID-19 specific Treatment: Convalescent plasma 6/1 Remdesivir 5/30 > 6/3 No Actemra due to abnormal LFTs  Subjective: Has been weaned to RA but intermittently having desaturations requiring resumption of O2 support.  Spoke to patient with iPad interpreter.  Denies nausea vomiting chest pain abdominal pain.  Does report new shortness of breath significantly worse with exertion.  Assessment & Plan:  Acute hypoxic respiratory failure due to COVID-19 pneumonia  exercise tolerance is poor -increase diuretic to see if it helps with his new oxygen requirement -watch closely for repeat decline- cont to work w/ PT/OT   Brief episode paroxysmal atrial fibrillation w RVR occurred in the ICU after extubated the first time - no further episodes since - CHA2DS2 VASc score is 0 - remains in sinus rhythm - low TSH but with normal T4 - continue aspirin and metoprolol - TTE in f/u   Hyperglycemia A1c 6.2 - CBG controlled - stop SSI and CBG checks as pt is not diabetic and no longer on steroids    Transaminitis Essentially resolved  Urinary retention Resolved - Foley catheter discontinued - cont Flomax  DVT prophylaxis: lovenox  Code Status: FULL CODE Family Communication:  Disposition Plan: D/C home when oxygen requirement improved-  HHOT/PT  Consultants:  none  Antimicrobials:  none  Objective: Blood pressure 112/79, pulse 100, temperature 97.9 F (36.6 C), temperature source Oral, resp. rate (!) 43, height 5\' 3"  (1.6 m), weight 66.4 kg, SpO2 (!) 86 %.  Intake/Output Summary (Last 24 hours) at 04/29/2019 0828 Last data filed at 04/29/2019 0413 Gross per 24 hour  Intake 720 ml  Output 625 ml  Net 95 ml   Filed Weights   04/27/19 0500 04/28/19 0404 04/29/19 0410  Weight: 65.2 kg 65.9 kg 66.4 kg    Examination: General: No acute respiratory distress with nasal cannula oxygen support Lungs: Fine crackles scattered diffusely with no wheezing Cardiovascular: RRR without murmur or rub Abdomen: NT/ND, soft, thin, no rebound Extremities: No C/C/E bilateral lower extremities  CBC: Recent Labs  Lab 04/24/19 0400 04/26/19 0413 04/29/19 0400  WBC 12.7* 7.2 4.7  NEUTROABS  --  5.6 2.2  HGB 12.3* 12.0* 11.7*  HCT 38.6* 38.5* 37.4*  MCV 84.1 84.2 84.2  PLT 307 274 200   Basic Metabolic Panel: Recent Labs  Lab 04/27/19 0304 04/28/19 0326 04/29/19 0400  NA 134* 135 135  K 3.6 3.5 3.5  CL 92* 93* 94*  CO2 28 29 29   GLUCOSE 98 104* 93  BUN 23* 24* 22*  CREATININE 0.98 1.04 1.00  CALCIUM 8.7* 8.9 8.8*   GFR: Estimated Creatinine Clearance: 71.1 mL/min (by C-G formula based on SCr of 1 mg/dL).  Liver Function Tests: Recent Labs  Lab 04/23/19 0300 04/24/19 0400 04/26/19 0413 04/29/19 0400  AST 61* 41 30 43*  ALT 121*  92* 66* 58*  ALKPHOS 82 84 74 79  BILITOT 0.3 0.5 0.5 <0.1*  PROT 7.1 7.3 7.1 7.2  ALBUMIN 2.6* 2.7* 2.6* 2.7*    HbA1C: Hgb A1c MFr Bld  Date/Time Value Ref Range Status  04/15/2019 04:11 AM 6.2 (H) 4.8 - 5.6 % Final    Comment:    (NOTE) Pre diabetes:          5.7%-6.4% Diabetes:              >6.4% Glycemic control for   <7.0% adults with diabetes     CBG: Recent Labs  Lab 04/27/19 2101 04/28/19 0814 04/28/19 1158 04/28/19 2055 04/29/19 0751  GLUCAP 93 108*  145* 92 121*     Scheduled Meds: . aspirin  325 mg Oral Daily  . Chlorhexidine Gluconate Cloth  6 each Topical Daily  . dextromethorphan  30 mg Oral BID   And  . guaiFENesin  15 mL Oral Q6H  . enoxaparin (LOVENOX) injection  40 mg Subcutaneous Q12H  . feeding supplement (ENSURE ENLIVE)  237 mL Oral BID BM  . mouth rinse  15 mL Mouth Rinse BID  . metoprolol tartrate  12.5 mg Oral BID  . multivitamin  15 mL Oral Daily  . polyethylene glycol  17 g Oral Daily  . sodium chloride flush  3 mL Intravenous Q12H  . tamsulosin  0.4 mg Oral QPC breakfast     LOS: 21 days   Cherene Altes, MD [facility] Office  [phone] Pager - Text Page per Amion  If 7PM-7AM, please contact night-coverage per Amion 04/29/2019, 8:28 AM

## 2019-04-29 NOTE — Plan of Care (Signed)
  Problem: Health Behavior/Discharge Planning: Goal: Ability to manage health-related needs will improve Outcome: Progressing   Problem: Clinical Measurements: Goal: Ability to maintain clinical measurements within normal limits will improve Outcome: Progressing Goal: Diagnostic test results will improve Outcome: Progressing Goal: Respiratory complications will improve Outcome: Progressing   Problem: Activity: Goal: Risk for activity intolerance will decrease Outcome: Progressing   Problem: Coping: Goal: Level of anxiety will decrease Outcome: Progressing

## 2019-04-30 MED ORDER — FUROSEMIDE 40 MG PO TABS: 40.0000 mg | ORAL_TABLET | Freq: Two times a day (BID) | ORAL | 0 refills | Status: DC

## 2019-04-30 MED ORDER — ASPIRIN 325 MG PO TABS: 325.0000 mg | ORAL_TABLET | Freq: Every day | ORAL | Status: DC

## 2019-04-30 MED ORDER — METOPROLOL TARTRATE 25 MG PO TABS: 12.5000 mg | ORAL_TABLET | Freq: Two times a day (BID) | ORAL | 0 refills | Status: DC

## 2019-04-30 MED ORDER — FUROSEMIDE 20 MG PO TABS: 40.0000 mg | ORAL_TABLET | Freq: Two times a day (BID) | ORAL | Status: DC

## 2019-04-30 MED ORDER — TAMSULOSIN HCL 0.4 MG PO CAPS: 0.4000 mg | ORAL_CAPSULE | Freq: Every day | ORAL | 0 refills | Status: DC

## 2019-04-30 MED ORDER — ACETAMINOPHEN 160 MG/5ML PO SOLN: 650.0000 mg | Freq: Four times a day (QID) | ORAL | 0 refills | Status: DC | PRN

## 2019-04-30 NOTE — TOC Transition Note (Signed)
Transition of Care (TOC) - CM/SW Discharge Note   Patient Details  Name: Melvin Mills MRN: [id number] Date of Birth: 1971-05-15  Transition of Care [facility]) CM/SW Contact:  Midge Minium RN, BSN, NCM-BC, ACM-RN [phone] (working remotely) Phone Number: 04/30/2019, 12:36 PM   Clinical Narrative:    CM consult acknowledged for PCP scheduling. CM spoke to the patients PCP: Juluis Mire, NP [facility]) to discuss scheduling a hospital f/u appointment within 5-6 days. Per the NP,[facility] is closed today d/t the NP having death in the family. The NP requested the patient information and will schedule the patient with an appointment as requested by Dr. Thereasa Solo. CM informed Dr. Thereasa Solo and updated the patients AVS. [place] services arranged with [facility]; Adela Lank, RN [facility] liaison) was updated on the patients POC. DME referral given to Learta Codding, [place] liaison; AVS updated. [facility] will provide the patient with a BSC and thermometer prior to the patient discharging home. No further needs from CM.    Final next level of care: [place] Barriers to Discharge: No Barriers Identified   Patient Goals and CMS Choice Patient states their goals for this hospitalization and ongoing recovery are:: to get better CMS Medicare.gov Compare Post Acute Care list provided to:: Patient Choice offered to / list presented to : Patient  Discharge Plan and Services     Post Acute Care Choice: Home Health          DME Arranged: 3-N-1 DME Agency: [place] Date DME Agency Contacted: 04/30/19 Time DME Agency Contacted: 1208 Representative spoke with at DME Agency: Learta Codding liaison HH Arranged: RN, Disease Management, PT, OT [facility] Agency: [place] Date [place]: 04/30/19 Time [place]: 1208 Representative spoke with at [place]: Adela Lank RN Liaison  Social Determinants of Health ([place]) Interventions      Readmission Risk Interventions No flowsheet data found.

## 2019-04-30 NOTE — Discharge Summary (Signed)
DISCHARGE SUMMARY  Melvin Mills  MR#: [id number]  DOB:1971-04-01  Date of Admission: 04/08/2019 Date of Discharge: 04/30/2019  Attending Physician:Freddy Kinne Hennie Duos, MD  Patient's UXL:[id number], Milford Cage, NP  Consults: none  Disposition: D/C HOME   Follow-up Appts: Follow-up Information    Care, [facility] Follow up.   Specialty: [place] Why: A representative from [facility] will contact you to arrange start date and time for your therapy. Contact information: [address] [place] [address] [phone]        Kerin Perna, NP Follow up.   Specialty: Internal Medicine Why: You will be contacted concerning your appointment. Please call their office if you haven't received a call by 05/04/19. Thanks Contact information: [place] [address] [place] Follow up.   Why: [facility] Contact information: [place] [address] [phone]           Tests Needing Follow-up: -assess oxygen saturations, especially w/ exertion  -determine if long term use of Flomax is required -assure diuretic dosing does not need to be resumed -assess heart rhythm  -Recheck TSH in 8-12 weeks -Consider screening transthoracic echocardiogram to rule out valvular dysfunction in patient who had a limited episode of atrial fibrillation while hospitalized  Discharge Diagnoses: Acute hypoxic respiratory failure COVID-19 pneumonia Paroxysmal atrial fibrillation w RVR Hyperglycemia Transaminitis Urinary retention  COVID-19 specific Treatment: Convalescent plasma 6/1 Remdesivir 5/30 > 6/3 No Actemra due to abnormal LFTs  Initial presentation: 48yo with no significant med hx who presented with 1 week of fever, SOB, and productive cough. In the ER a CXR showed bilateral infiltrates and the pt was SARS-CoV-2 positive.  After admission he suffered  progressive hypoxia and required intubation 5/30. After extubation 6/1 he developed recurrent respiratory distress and new onset Afib and was reintubated.  Hospital Course:  Acute hypoxic respiratory failure due to COVID-19 pneumonia exercise tolerance is poor - cont diuretic -overall the patient has made a dramatic recovery after being severely critically ill -he is actually able to ambulate at the time of his discharge without oxygen support -given the severity of his illness and the time course of his hospitalization he has been advised to refrain from returning to work for another week to allow him to recuperate -at the time of his discharge he is comfortable on room air with no respiratory distress whatsoever  Brief episode paroxysmal atrial fibrillation w RVR occurred in the ICU after extubated the first time - no further episodes since - CHA2DS2 VASc score is 0 - remains in sinus rhythm at time of discharge - low TSH but with normal T4 - continue aspirin and metoprolol - TTE in f/u   Hyperglycemia A1c 6.2 - CBG controlled - stopped SSI and CBG checks as pt is not diabetic and no longer on steroids   Transaminitis Essentially resolved  Urinary retention Resolved - Foley catheter discontinued - cont Flomax at time of discharge but can likely be discontinued in follow-up when patient is more ambulatory  Allergies as of 04/30/2019   No Known Allergies     Medication List    TAKE these medications   acetaminophen 160 MG/5ML solution Commonly known as: TYLENOL Take 20.3 mLs (650 mg total) by mouth every 6 (six) hours as needed for mild pain, headache or fever.   aspirin 325 MG tablet Take 1 tablet (325 mg total) by mouth daily. Start taking  on: May 01, 2019   furosemide 40 MG tablet Commonly known as: LASIX Take 1 tablet (40 mg total) by mouth 2 (two) times daily for 4 days.   metoprolol tartrate 25 MG tablet Commonly known as: LOPRESSOR Take 0.5 tablets (12.5 mg total)  by mouth 2 (two) times daily.   tamsulosin 0.4 MG Caps capsule Commonly known as: FLOMAX Take 1 capsule (0.4 mg total) by mouth daily after breakfast. Start taking on: May 01, 2019            Durable Medical Equipment  (From admission, onward)         Start     Ordered   04/26/19 0721  For home use only DME 3 n 1  Once     04/26/19 0720          Day of Discharge BP 116/82   Pulse 91   Temp (!) 97.5 F (36.4 C) (Oral)   Resp (!) 31   Ht 5\' 3"  (1.6 m)   Wt 66.1 kg   SpO2 95%   BMI 25.81 kg/m   Physical Exam: General: No acute respiratory distress Lungs: Clear to auscultation bilaterally without wheezes or crackles Cardiovascular: Regular rate and rhythm without murmur gallop or rub normal S1 and S2 Abdomen: Nontender, nondistended, soft, bowel sounds positive, no rebound, no ascites, no appreciable mass Extremities: No significant cyanosis, clubbing, or edema bilateral lower extremities  Basic Metabolic Panel: Recent Labs  Lab 04/24/19 0400 04/26/19 0413 04/27/19 0304 04/28/19 0326 04/29/19 0400  NA 133* 133* 134* 135 135  K 4.3 4.0 3.6 3.5 3.5  CL 95* 93* 92* 93* 94*  CO2 28 28 28 29 29   GLUCOSE 107* 96 98 104* 93  BUN 24* 19 23* 24* 22*  CREATININE 0.96 0.98 0.98 1.04 1.00  CALCIUM 8.8* 8.7* 8.7* 8.9 8.8*    Liver Function Tests: Recent Labs  Lab 04/24/19 0400 04/26/19 0413 04/29/19 0400  AST 41 30 43*  ALT 92* 66* 58*  ALKPHOS 84 74 79  BILITOT 0.5 0.5 <0.1*  PROT 7.3 7.1 7.2  ALBUMIN 2.7* 2.6* 2.7*    CBC: Recent Labs  Lab 04/24/19 0400 04/26/19 0413 04/29/19 0400  WBC 12.7* 7.2 4.7  NEUTROABS  --  5.6 2.2  HGB 12.3* 12.0* 11.7*  HCT 38.6* 38.5* 37.4*  MCV 84.1 84.2 84.2  PLT 307 274 200    Time spent in discharge (includes decision making & examination of pt): 35 minutes  04/30/2019, 12:44 PM   Lonia BloodJeffrey T. Ryin Ambrosius, MD [facility] Office  [phone]

## 2019-04-30 NOTE — Progress Notes (Signed)
Patient IV d/ced without problems.  Reviewed all d/c instructions with patient using [employer] interpreter [place]; patient verbalized understanding and denied questions.  Pcp to call and set up appt with patient as office is currently closed per [facility] CM.  Will get BSC and thermometer from [facility] and pt to be d/ced via [facility] when daughter arrives.

## 2019-04-30 NOTE — Progress Notes (Signed)
[facility] Department and AHF Team  ReDS Research Project   Patient #: [id number]  ReDS Measurement  Right: low quality x 3  Left: low quality

## 2019-04-30 NOTE — Progress Notes (Signed)
   04/30/19  To Whom it may concern,  Y'Niang Gironda was admitted to [facility] on 04/08/2019 and remained under my care in the hospital through 04/30/19. He was critically ill and truly fighting for his life. He has been advised that he should not return to work until 05/07/19.  Sincerely,  Cherene Altes, MD [facility]  [phone]

## 2019-04-30 NOTE — Progress Notes (Signed)
Telephone call to Javier Docker, patient's contact.  Updated on plan of care and answered all questions.  Given contact number if any questions/concerns today.

## 2019-04-30 NOTE — Progress Notes (Signed)
Patient oxygen sats decreased to 89% on room air, placed back on 1L [place], sats increased to 94%. Will continue to try to wean oxygen.

## 2021-11-20 ENCOUNTER — Emergency Department (HOSPITAL_COMMUNITY)
Admission: EM | Admit: 2021-11-20 | Discharge: 2021-11-21 | Disposition: A | Discharge status: Home / Self Care | Payer: Self-pay | Attending: Emergency Medicine | Admitting: Emergency Medicine

## 2021-11-20 ENCOUNTER — Encounter (HOSPITAL_COMMUNITY): Payer: Self-pay

## 2021-11-20 ENCOUNTER — Emergency Department (HOSPITAL_COMMUNITY)
Admission: EM | Admit: 2021-11-20 | Discharge: 2021-11-20 | Disposition: A | Discharge status: Home / Self Care | Payer: Self-pay | Source: Home / Self Care

## 2021-11-20 DIAGNOSIS — Z7982 Long term (current) use of aspirin: Secondary | ICD-10-CM | POA: Insufficient documentation

## 2021-11-20 DIAGNOSIS — Z20822 Contact with and (suspected) exposure to covid-19: Secondary | ICD-10-CM | POA: Insufficient documentation

## 2021-11-20 DIAGNOSIS — R42 Dizziness and giddiness: Secondary | ICD-10-CM | POA: Insufficient documentation

## 2021-11-20 DIAGNOSIS — Z79899 Other long term (current) drug therapy: Secondary | ICD-10-CM | POA: Insufficient documentation

## 2021-11-20 DIAGNOSIS — R791 Abnormal coagulation profile: Secondary | ICD-10-CM | POA: Insufficient documentation

## 2021-11-20 DIAGNOSIS — R112 Nausea with vomiting, unspecified: Secondary | ICD-10-CM | POA: Insufficient documentation

## 2021-11-20 NOTE — ED Triage Notes (Signed)
Pt BIB [facility] for eval of dizziness. EMS reports family grew concerned when he wasn't talking back to them, and family ultimately began CPR. Pt was alert and awake on EMS arrival, family members still performing CPR on EMS arrival. Pt denies CP/SOB. Able to ambulate w/ EMS.

## 2021-11-21 ENCOUNTER — Emergency Department (HOSPITAL_COMMUNITY): Payer: Self-pay

## 2021-11-21 LAB — URINALYSIS, MICROSCOPIC (REFLEX): Bacteria, UA: NONE SEEN

## 2021-11-21 LAB — COMPREHENSIVE METABOLIC PANEL
ALT: 25 U/L (ref 0–44)
AST: 31 U/L (ref 15–41)
Albumin: 3.9 g/dL (ref 3.5–5.0)
Alkaline Phosphatase: 71 U/L (ref 38–126)
Anion gap: 9 (ref 5–15)
BUN: 23 mg/dL — ABNORMAL HIGH (ref 6–20)
CO2: 21 mmol/L — ABNORMAL LOW (ref 22–32)
Calcium: 8.4 mg/dL — ABNORMAL LOW (ref 8.9–10.3)
Chloride: 102 mmol/L (ref 98–111)
Creatinine, Ser: 1.38 mg/dL — ABNORMAL HIGH (ref 0.61–1.24)
GFR, Estimated: >60 mL/min (ref >60)
Glucose, Bld: 135 mg/dL — ABNORMAL HIGH (ref 70–99)
Potassium: 3.2 mmol/L — ABNORMAL LOW (ref 3.5–5.1)
Sodium: 132 mmol/L — ABNORMAL LOW (ref 135–145)
Total Bilirubin: 0.5 mg/dL (ref 0.3–1.2)
Total Protein: 7.5 g/dL (ref 6.5–8.1)

## 2021-11-21 LAB — RAPID URINE DRUG SCREEN, HOSP PERFORMED
Amphetamines: NOT DETECTED
Barbiturates: NOT DETECTED
Benzodiazepines: NOT DETECTED
Cocaine: NOT DETECTED
Opiates: NOT DETECTED
Tetrahydrocannabinol: NOT DETECTED

## 2021-11-21 LAB — CBC
HCT: 46.4 % (ref 39.0–52.0)
Hemoglobin: 15.1 g/dL (ref 13.0–17.0)
MCH: 27.7 pg (ref 26.0–34.0)
MCHC: 32.5 g/dL (ref 30.0–36.0)
MCV: 85 fL (ref 80.0–100.0)
Platelets: 159 10*3/uL (ref 150–400)
RBC: 5.46 MIL/uL (ref 4.22–5.81)
RDW: 13.8 % (ref 11.5–15.5)
WBC: 9.8 10*3/uL (ref 4.0–10.5)
nRBC: 0 % (ref 0.0–0.2)

## 2021-11-21 LAB — ETHANOL: Alcohol, Ethyl (B): <10 mg/dL (ref <10)

## 2021-11-21 LAB — URINALYSIS, ROUTINE W REFLEX MICROSCOPIC
Bilirubin Urine: NEGATIVE
Glucose, UA: NEGATIVE mg/dL
Ketones, ur: NEGATIVE mg/dL
Leukocytes,Ua: NEGATIVE
Nitrite: NEGATIVE
Protein, ur: NEGATIVE mg/dL
Specific Gravity, Urine: >1.03 — ABNORMAL HIGH (ref 1.005–1.030)
pH: 6 (ref 5.0–8.0)

## 2021-11-21 LAB — DIFFERENTIAL
Abs Immature Granulocytes: 0.03 10*3/uL (ref 0.00–0.07)
Basophils Absolute: 0.1 10*3/uL (ref 0.0–0.1)
Basophils Relative: 1 %
Eosinophils Absolute: 1.5 10*3/uL — ABNORMAL HIGH (ref 0.0–0.5)
Eosinophils Relative: 15 %
Immature Granulocytes: 0 %
Lymphocytes Relative: 29 %
Lymphs Abs: 2.8 10*3/uL (ref 0.7–4.0)
Monocytes Absolute: 0.6 10*3/uL (ref 0.1–1.0)
Monocytes Relative: 6 %
Neutro Abs: 4.8 10*3/uL (ref 1.7–7.7)
Neutrophils Relative %: 49 %

## 2021-11-21 LAB — PROTIME-INR
INR: 1 (ref 0.8–1.2)
Prothrombin Time: 13.6 seconds (ref 11.4–15.2)

## 2021-11-21 LAB — TROPONIN I (HIGH SENSITIVITY)
Troponin I (High Sensitivity): 7 ng/L (ref <18)
Troponin I (High Sensitivity): 6 ng/L (ref <18)

## 2021-11-21 LAB — APTT: aPTT: 29 seconds (ref 24–36)

## 2021-11-21 LAB — RESP PANEL BY RT-PCR (FLU A&B, COVID) ARPGX2
Influenza A by PCR: NEGATIVE
Influenza B by PCR: NEGATIVE
SARS Coronavirus 2 by RT PCR: NEGATIVE

## 2021-11-21 MED ORDER — MECLIZINE HCL 25 MG PO TABS
25.0000 mg | ORAL_TABLET | Freq: Once | ORAL | Status: AC
Start: 2021-11-21 — End: 2021-11-21
  Administered 2021-11-21: 25 mg via ORAL
  Filled 2021-11-21: qty 1

## 2021-11-21 MED ORDER — ONDANSETRON HCL 4 MG PO TABS: 4.0000 mg | ORAL_TABLET | Freq: Four times a day (QID) | ORAL | 0 refills | Status: DC

## 2021-11-21 MED ORDER — MECLIZINE HCL 25 MG PO TABS: 25.0000 mg | ORAL_TABLET | Freq: Three times a day (TID) | ORAL | 0 refills | Status: DC | PRN

## 2021-11-21 MED ORDER — ONDANSETRON 4 MG PO TBDP
4.0000 mg | ORAL_TABLET | Freq: Once | ORAL | Status: AC
  Administered 2021-11-21: 4 mg via ORAL
  Filled 2021-11-21: qty 1

## 2021-11-21 NOTE — ED Provider Notes (Signed)
[facility] EMERGENCY DEPARTMENT Provider Note   CSN: [id number] Arrival date & time: 11/20/21  2333     History  Chief Complaint  Patient presents with   Altered Mental Status    Melvin Mills is a 51 y.o. male.  The history is provided by the patient.  Dizziness Quality:  Room spinning Severity:  Moderate Onset quality:  Gradual Duration:  1 day Timing:  Intermittent Progression:  Waxing and waning Chronicity:  New Context: head movement and standing up   Relieved by:  Being still Worsened by:  Turning head Associated symptoms: no blood in stool, no chest pain, no diarrhea, no headaches, no hearing loss, no nausea, no palpitations, no shortness of breath, no syncope, no tinnitus, no vision changes, no vomiting and no weakness   Risk factors: no heart disease, no hx of stroke and no hx of vertigo       Home Medications Prior to Admission medications   Medication Sig Start Date End Date Taking? Authorizing Provider  meclizine (ANTIVERT) 25 MG tablet Take 1 tablet (25 mg total) by mouth 3 (three) times daily as needed for dizziness. 11/21/21  Yes Sunaina Ferrando, DO  ondansetron (ZOFRAN) 4 MG tablet Take 1 tablet (4 mg total) by mouth every 6 (six) hours. 11/21/21  Yes Bessie Livingood, DO  acetaminophen (TYLENOL) 160 MG/5ML solution Take 20.3 mLs (650 mg total) by mouth every 6 (six) hours as needed for mild pain, headache or fever. Patient not taking: Reported on 11/21/2021 04/30/19   Cherene Altes, MD  aspirin 325 MG tablet Take 1 tablet (325 mg total) by mouth daily. Patient not taking: Reported on 11/21/2021 05/01/19   Cherene Altes, MD  furosemide (LASIX) 40 MG tablet Take 1 tablet (40 mg total) by mouth 2 (two) times daily for 4 days. Patient not taking: Reported on 11/21/2021 04/30/19 05/04/19  Cherene Altes, MD  metoprolol tartrate (LOPRESSOR) 25 MG tablet Take 0.5 tablets (12.5 mg total) by mouth 2 (two) times daily. Patient not taking:  Reported on 11/21/2021 04/30/19   Cherene Altes, MD  tamsulosin [facility]) 0.4 MG CAPS capsule Take 1 capsule (0.4 mg total) by mouth daily after breakfast. Patient not taking: Reported on 11/21/2021 05/01/19   Cherene Altes, MD      Allergies    Patient has no known allergies.    Review of Systems   Review of Systems  Constitutional:  Negative for chills and fever.  HENT:  Negative for ear pain, hearing loss, sore throat and tinnitus.   Eyes:  Negative for pain and visual disturbance.  Respiratory:  Negative for cough and shortness of breath.   Cardiovascular:  Negative for chest pain, palpitations and syncope.  Gastrointestinal:  Negative for abdominal pain, blood in stool, diarrhea, nausea and vomiting.  Genitourinary:  Negative for dysuria and hematuria.  Musculoskeletal:  Negative for arthralgias and back pain.  Skin:  Negative for color change and rash.  Neurological:  Positive for dizziness. Negative for seizures, syncope, weakness and headaches.  All other systems reviewed and are negative.  Physical Exam Updated Vital Signs BP 135/90    Pulse 62    Temp 98.1 F (36.7 C) (Oral)    Resp (!) 25    Ht 5\' 3"  (1.6 m)    Wt 66 kg    SpO2 99%    BMI 25.77 kg/m  Physical Exam Vitals and nursing note reviewed.  Constitutional:      General: He is  not in acute distress.    Appearance: He is well-developed. He is not ill-appearing.  HENT:     Head: Normocephalic and atraumatic.     Nose: Nose normal.     Mouth/Throat:     Mouth: Mucous membranes are moist.  Eyes:     Extraocular Movements: Extraocular movements intact.     Conjunctiva/sclera: Conjunctivae normal.  Cardiovascular:     Rate and Rhythm: Normal rate and regular rhythm.     Pulses: Normal pulses.     Heart sounds: Normal heart sounds. No murmur heard. Pulmonary:     Effort: Pulmonary effort is normal. No respiratory distress.     Breath sounds: Normal breath sounds.  Abdominal:     Palpations: Abdomen is  soft.     Tenderness: There is no abdominal tenderness.  Musculoskeletal:        General: No swelling.     Cervical back: Normal range of motion and neck supple.  Skin:    General: Skin is warm and dry.     Capillary Refill: Capillary refill takes less than 2 seconds.  Neurological:     General: No focal deficit present.     Mental Status: He is alert and oriented to person, place, and time.     Cranial Nerves: No cranial nerve deficit.     Sensory: No sensory deficit.     Motor: No weakness.     Coordination: Coordination normal.     Gait: Gait normal.     Comments: 5+ out of 5 strength throughout, normal sensation, no drift, normal finger-nose-finger, normal speech, normal gait, normal heel-to-shin, no nystagmus, positive Dix-Hallpike to the left  Psychiatric:        Mood and Affect: Mood normal.    ED Results / Procedures / Treatments   Labs (all labs ordered are listed, but only abnormal results are displayed) Labs Reviewed  DIFFERENTIAL - Abnormal; Notable for the following components:      Result Value   Eosinophils Absolute 1.5 (*)    All other components within normal limits  COMPREHENSIVE METABOLIC PANEL - Abnormal; Notable for the following components:   Sodium 132 (*)    Potassium 3.2 (*)    CO2 21 (*)    Glucose, Bld 135 (*)    BUN 23 (*)    Creatinine, Ser 1.38 (*)    Calcium 8.4 (*)    All other components within normal limits  URINALYSIS, ROUTINE W REFLEX MICROSCOPIC - Abnormal; Notable for the following components:   Specific Gravity, Urine >1.030 (*)    Hgb urine dipstick TRACE (*)    All other components within normal limits  RESP PANEL BY RT-PCR (FLU A&B, COVID) ARPGX2  ETHANOL  PROTIME-INR  APTT  CBC  RAPID URINE DRUG SCREEN, HOSP PERFORMED  URINALYSIS, MICROSCOPIC (REFLEX)  TROPONIN I (HIGH SENSITIVITY)  TROPONIN I (HIGH SENSITIVITY)    EKG EKG Interpretation  Date/Time:  Tuesday November 20 2021 23:49:41 EST Ventricular Rate:  64 PR  Interval:  142 QRS Duration: 112 QT Interval:  418 QTC Calculation: 431 R Axis:   48 Text Interpretation: Normal sinus rhythm When compared with ECG of 08-Apr-2019 09:11, PREVIOUS ECG IS PRESENT Confirmed by Lennice Sites ([id number]) on 11/21/2021 7:11:34 AM  Radiology DG Chest 2 View  Result Date: 11/21/2021 CLINICAL DATA:  Weakness EXAM: CHEST - 2 VIEW COMPARISON:  04/26/2019 FINDINGS: Heart is borderline in size. No confluent airspace opacities, effusions or edema. No acute bony abnormality. IMPRESSION: No active cardiopulmonary  disease. Electronically Signed   By: Rolm Baptise M.D.   On: 11/21/2021 00:51   CT HEAD WO CONTRAST (5MM)  Result Date: 11/21/2021 CLINICAL DATA:  Dizziness, non-specific EXAM: CT HEAD WITHOUT CONTRAST TECHNIQUE: Contiguous axial images were obtained from the base of the skull through the vertex without intravenous contrast. RADIATION DOSE REDUCTION: This exam was performed according to the departmental dose-optimization program which includes automated exposure control, adjustment of the mA and/or kV according to patient size and/or use of iterative reconstruction technique. COMPARISON:  None. FINDINGS: Brain: No acute intracranial abnormality. Specifically, no hemorrhage, hydrocephalus, mass lesion, acute infarction, or significant intracranial injury. Vascular: No hyperdense vessel or unexpected calcification. Skull: No acute calvarial abnormality. Sinuses/Orbits: No acute findings Other: None IMPRESSION: No acute intracranial abnormality. Electronically Signed   By: Rolm Baptise M.D.   On: 11/21/2021 01:41    Procedures Procedures    Medications Ordered in ED Medications  meclizine (ANTIVERT) tablet 25 mg (25 mg Oral Given 11/21/21 0826)  ondansetron (ZOFRAN-ODT) disintegrating tablet 4 mg (4 mg Oral Given 11/21/21 [id number])    ED Course/ Medical Decision Making/ A&P                           Medical Decision Making  Melvin Mills is a 51 year old male with no  significant medical history presents the ED with dizziness.  Patient with overall unremarkable vitals.  Intermittent dizziness for the last day.  Worse when he turns his head to the left, worse when he stands up.  He has had nausea and vomiting with it.  Denies any headache or fever or chills.  Denies any weakness or numbness.  Denies any ringing in the ear or hearing loss.  Has no stroke comorbidities.  Has no chest pain or shortness of breath.  On exam he has normal neurologic function.  He has a positive Dix-Hallpike to the left.  He is able to ambulate with minimal symptoms.  He has no signs of posterior stroke.  Overall differential diagnosis includes peripheral vertigo versus Mnire's disease versus less likely stroke.  No concern for infectious process or other acute neurologic process.  However, there is question if there is some confusion when this first started and therefore we will obtain lab work and a head CT and reevaluate.  Will give meclizine and Zofran.  8:54 AM lab work and imaging ordered has been interpreted.  There is no acute anemia or electrolyte abnormality or kidney injury.  Head CT reviewed by myself shows no acute process.  No head bleed.  Chest x-ray ordered interpreted by myself shows no pneumonia or pneumothorax.  Troponin and urinalysis are within normal limits.  Overall my suspicion is for peripheral vertigo.  Of no concern for stroke at this time.  Will reevaluate after medications.  8:54 AM patient reevaluated and symptoms have improved.  We will discharge him with prescription for meclizine and Zofran.  We will have him follow-up with ear nose and throat doctor.  Discharged in good condition  This chart was dictated using voice recognition software.  Despite best efforts to proofread,  errors can occur which can change the documentation meaning.         Final Clinical Impression(s) / ED Diagnoses Final diagnoses:  Dizziness    Rx / DC Orders ED Discharge Orders           Ordered    meclizine (ANTIVERT) 25 MG tablet  3 times daily PRN  11/21/21 0854    ondansetron (ZOFRAN) 4 MG tablet  Every 6 hours        11/21/21 0854              Lennice Sites, DO 11/21/21 [id number]

## 2021-11-21 NOTE — ED Provider Triage Note (Addendum)
Emergency Medicine Provider Triage Evaluation Note  Melvin Mills , a 51 y.o. male  was evaluated in triage.  Pt complains of dizziness and weakness.  Patient states his symptoms started around 10 AM.  They were intermittent but began worsening this evening around 10 PM.  Per EMS notes, patient appeared unresponsive and his family began doing CPR on him this evening.  He states he remembers this.  He states he was feeling weak and dizzy but was awake.  Reports associated nausea and vomiting.  Reports global weakness but states that at times it feels right greater than left.  No numbness, visual changes, chest pain, shortness of breath, abdominal pain.  Denies any drug use or alcohol use.  Denies a history of similar symptoms.  History obtained via [name] interpreter.  Physical Exam  BP (!) 143/92 (BP Location: Left Arm)    Pulse (!) 59    Temp (!) 97.3 F (36.3 C) (Oral)    Resp (!) 28    Ht 5\' 3"  (1.6 m)    Wt 66 kg    SpO2 97%    BMI 25.77 kg/m  Gen:   Awake, no distress   Resp:  Normal effort  MSK:   Moves extremities without difficulty  Other:  Extraocular movements are intact.  Moving all 4 extremities with ease.  Strength is 5/5 in all 4 extremities.  No gross deficits.  Ambulatory with a steady gait.  Medical Decision Making  Medically screening exam initiated at 12:01 AM.  Appropriate orders placed.  Melvin Mills was informed that the remainder of the evaluation will be completed by another provider, this initial triage assessment does not replace that evaluation, and the importance of remaining in the ED until their evaluation is complete.  Outside of the tPA window.  No gross deficits.  We will obtain a stroke order set.  We will also obtain EKG and troponin due to patient's weakness.   Melvin Boys, PA-C 11/21/21 0003    01/19/22, PA-C 11/21/21 0031    01/19/22, PA-C 11/21/21 0217

## 2022-09-07 ENCOUNTER — Ambulatory Visit: Payer: Self-pay

## 2022-09-07 DIAGNOSIS — Z23 Encounter for immunization: Secondary | ICD-10-CM

## 2023-08-23 ENCOUNTER — Ambulatory Visit: Payer: Self-pay

## 2023-08-23 DIAGNOSIS — Z23 Encounter for immunization: Secondary | ICD-10-CM

## 2023-12-31 IMAGING — DX DG CHEST 2V
2 series · 2 of 2 positions shown · non-contrast
Comparison: 04/26/2019

CLINICAL DATA: Weakness

EXAM:
CHEST - 2 VIEW

[chest pa]
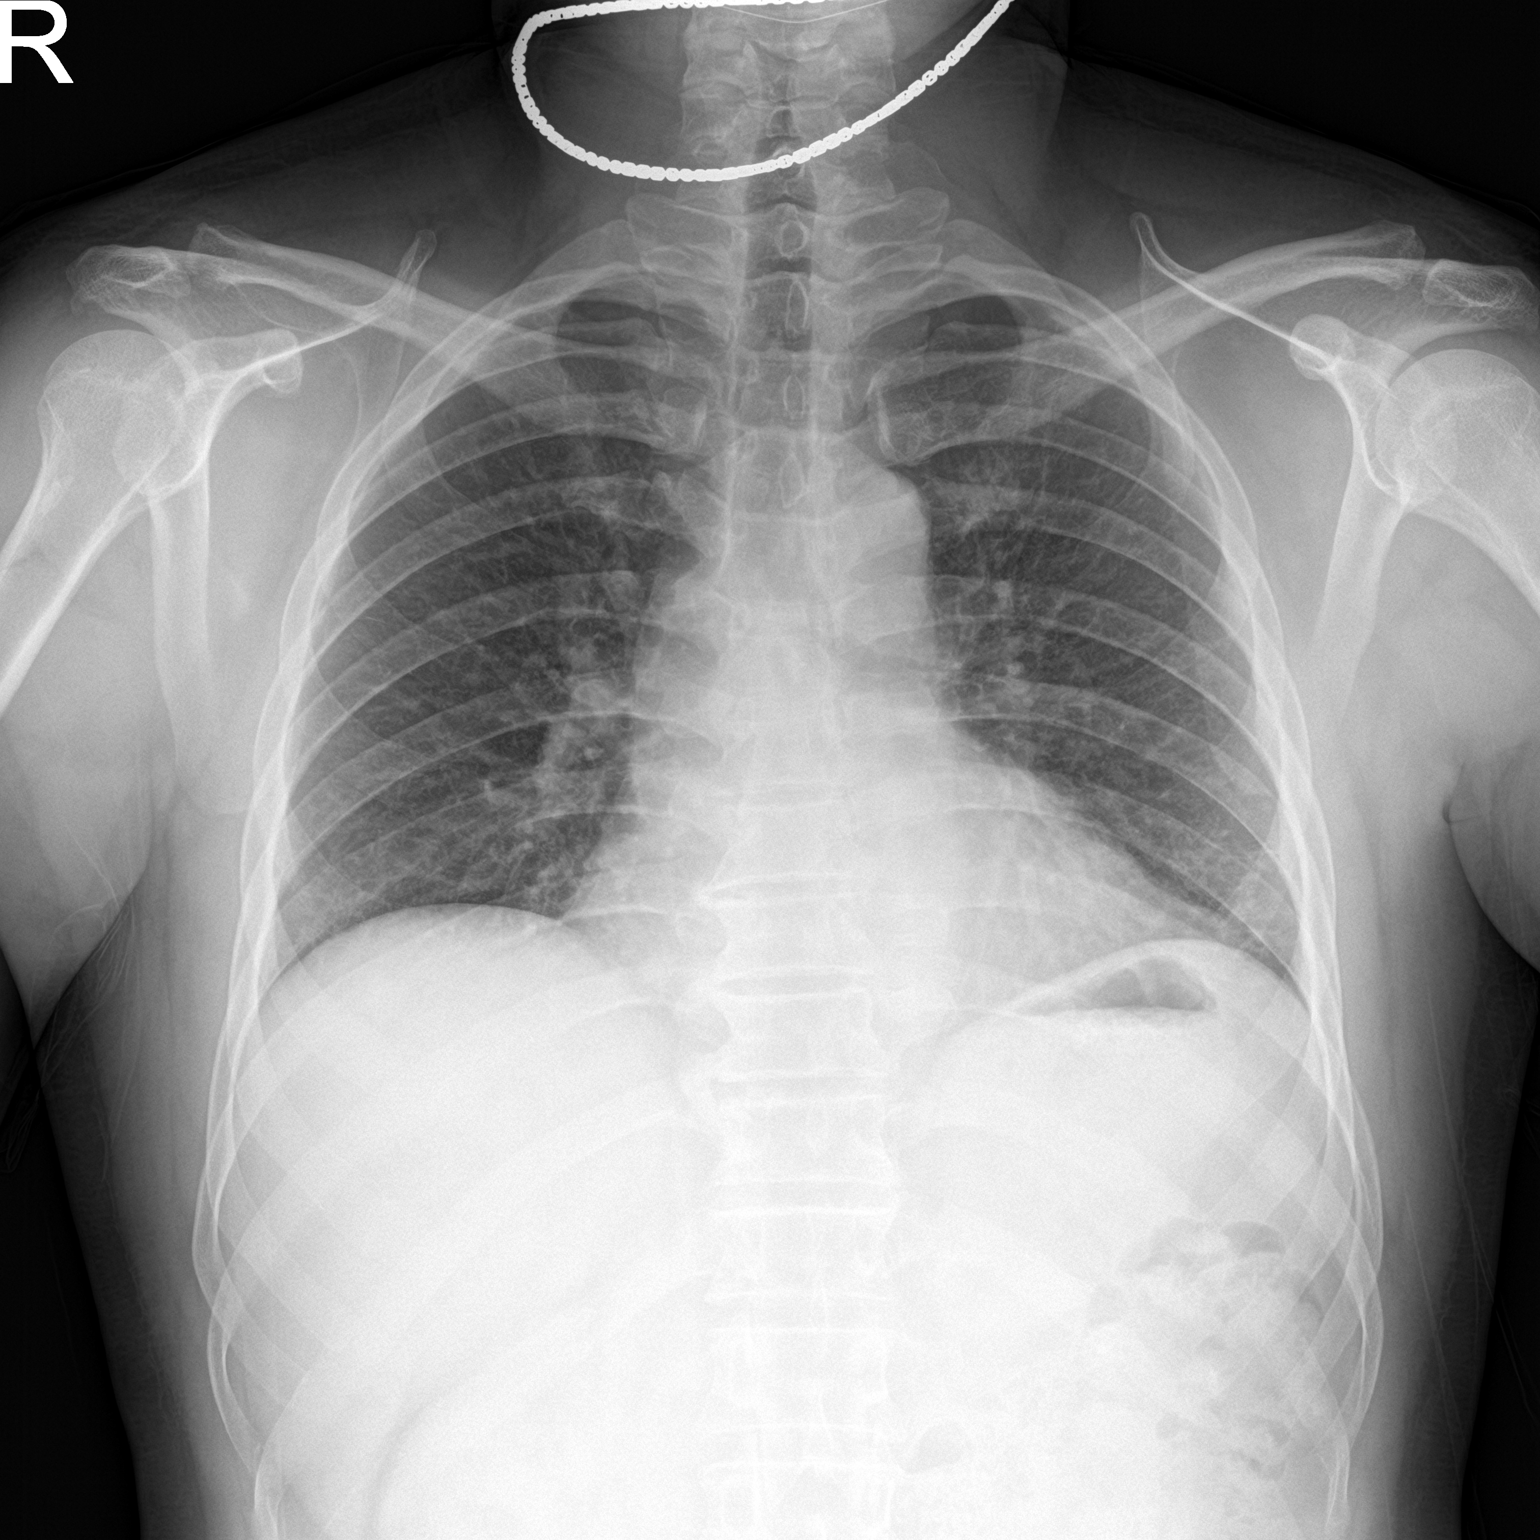

[chest lat]
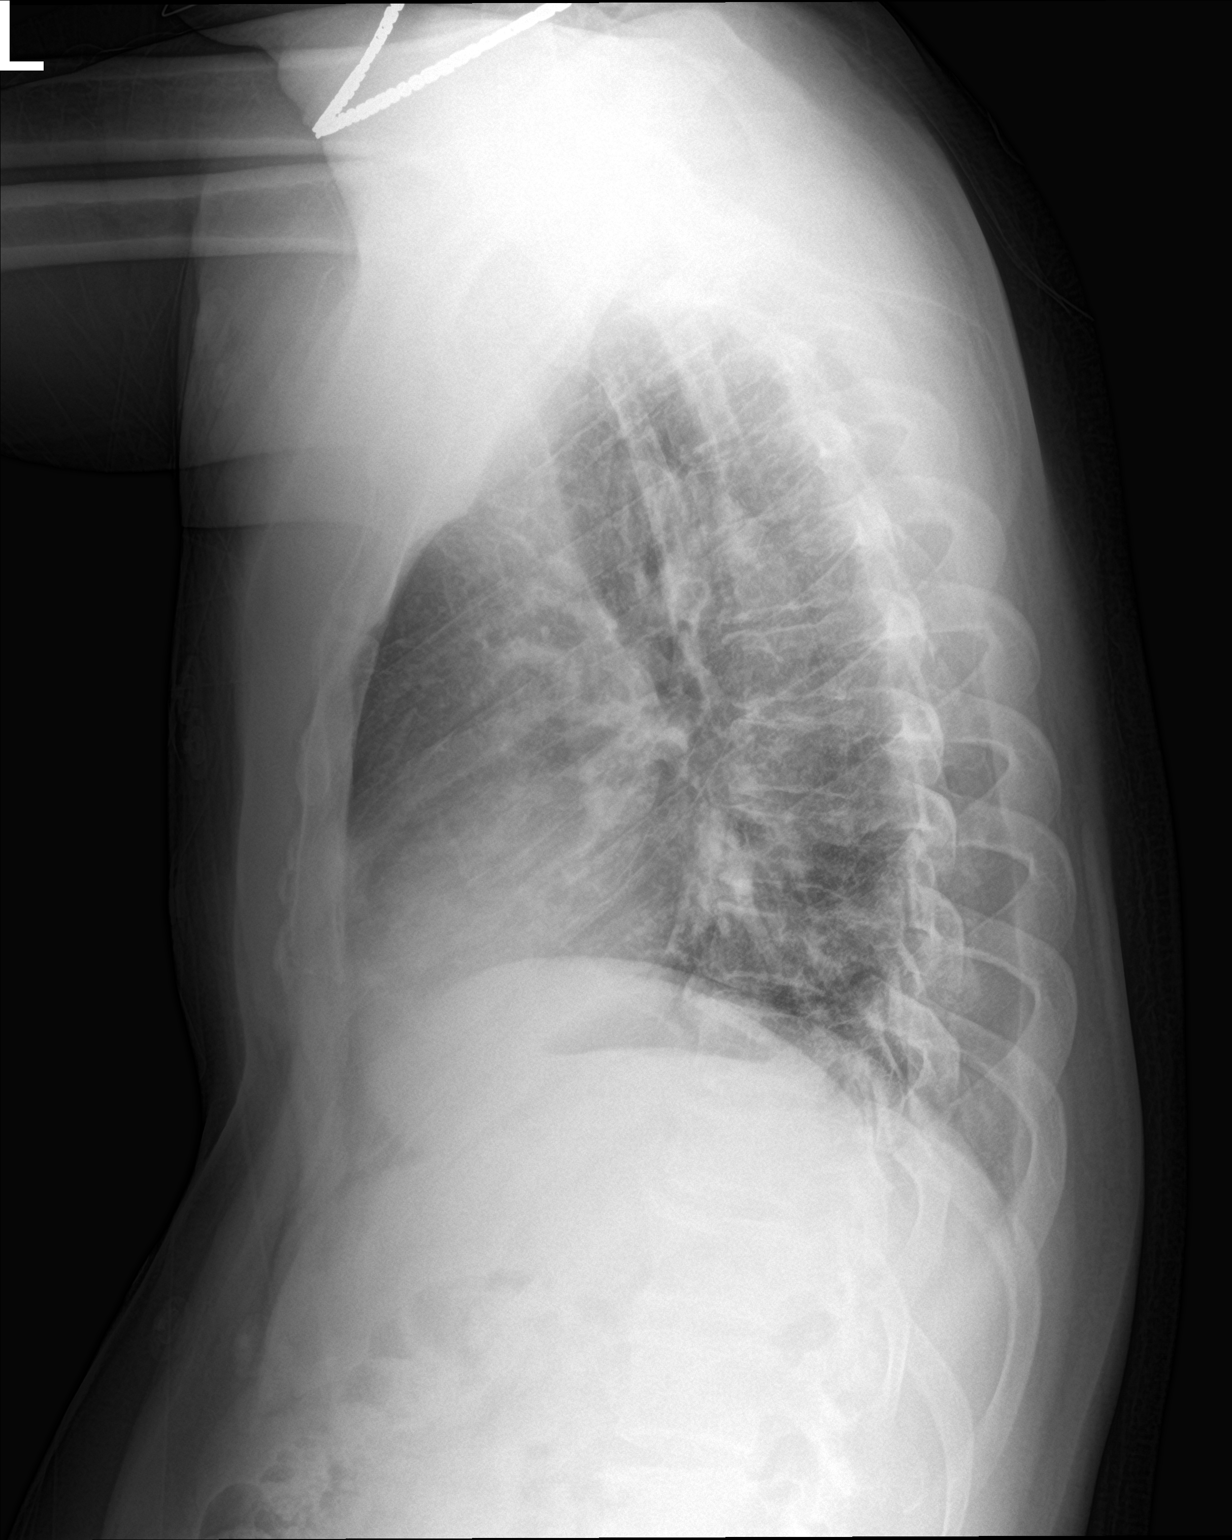

[2 of 2 positions shown; findings below may reference images not displayed]

FINDINGS: Heart is borderline in size. No confluent airspace opacities,
effusions or edema. No acute bony abnormality.
IMPRESSION: No active cardiopulmonary disease.

## 2023-12-31 IMAGING — CT CT HEAD W/O CM
5 of 6 series · 17 of 47 positions shown, 18 images · non-contrast
Comparison: None.

CLINICAL DATA: Dizziness, non-specific



[Series 3: head bone · axial · 0.47mm/px · z∈[-124,-4]mm · 7 of 87 slices shown]
[im 9/87  bone]
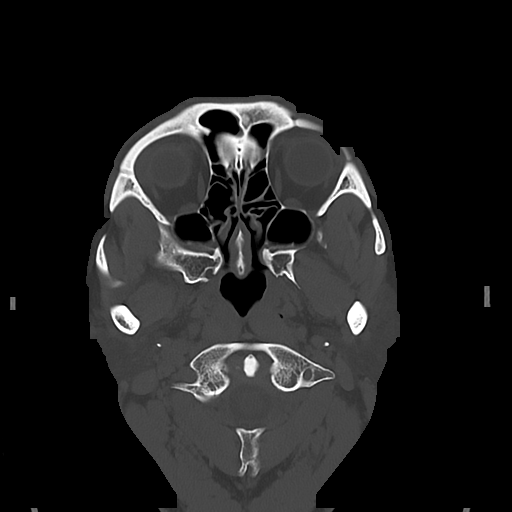
[im 18/87  bone]
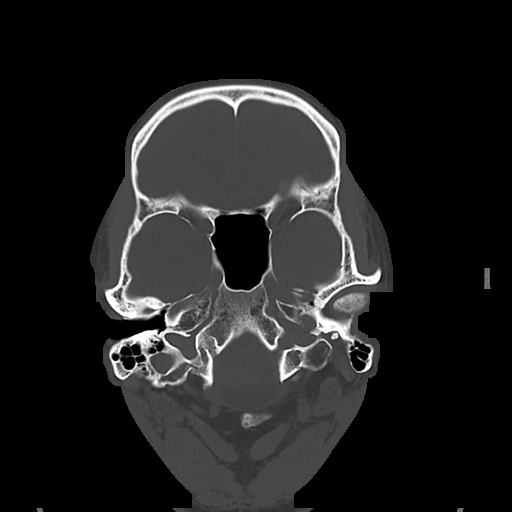
[im 26/87  bone]
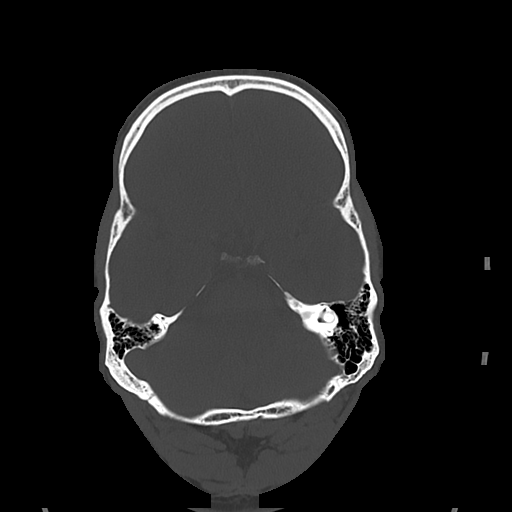
[im 35/87  bone]
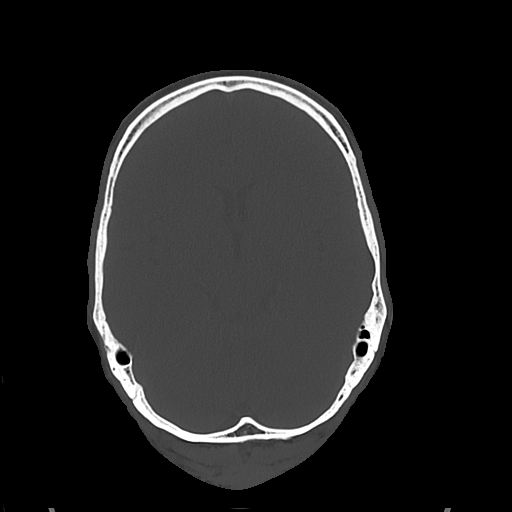
[im 52/87  bone]
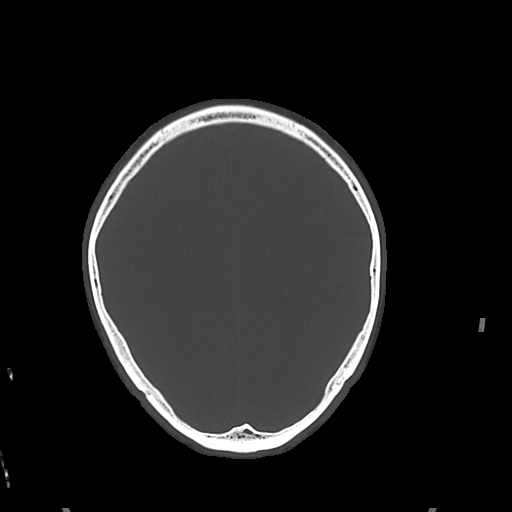
[im 61/87  bone]
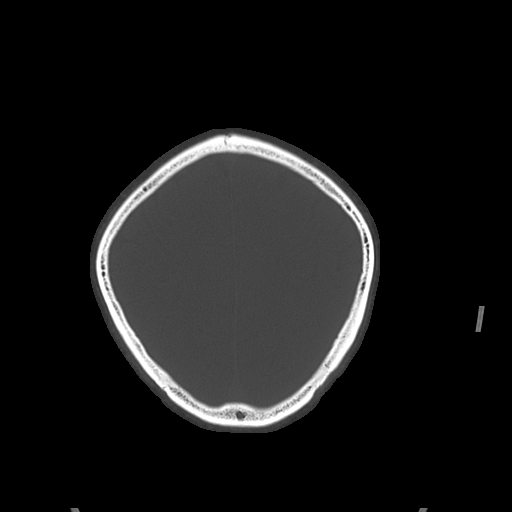
[im 69/87  bone]
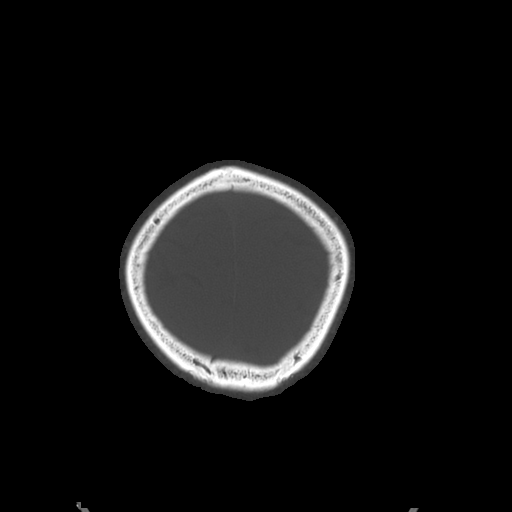

[Series 4: head wo · axial · 0.47mm/px · z∈[-86,-30]mm · 2 of 35 slices shown, 3 images]
[im 12/35  brain]
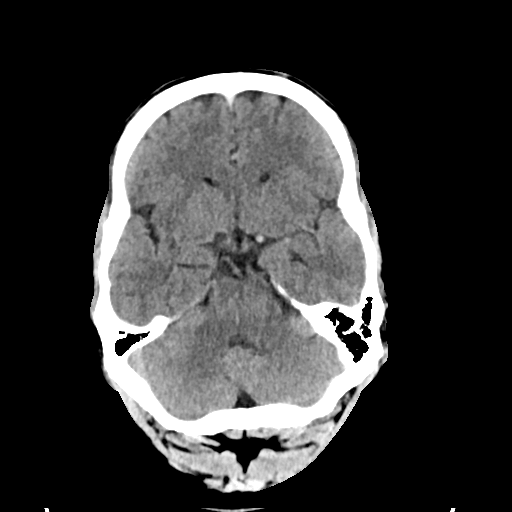
[im 12/35  bone]
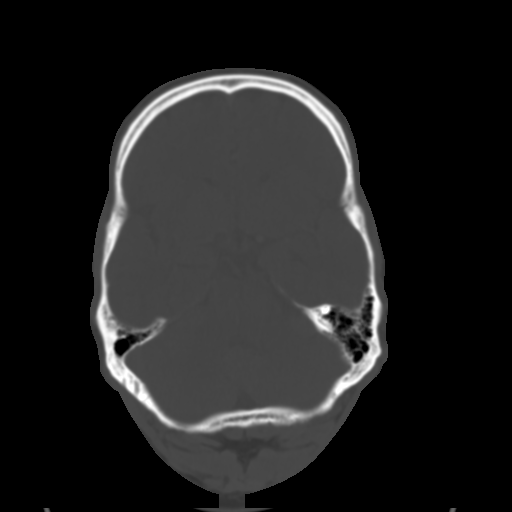
[im 23/35  brain]
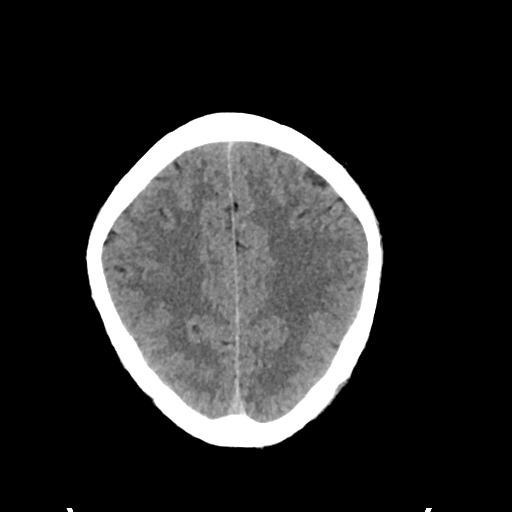

[Series 5: cor soft · coronal · 0.33mm/px · 3 of 67 slices shown]
[im 23/67  brain]
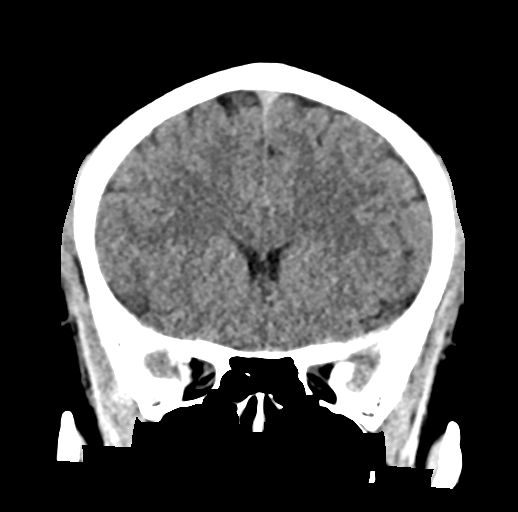
[im 30/67  brain]
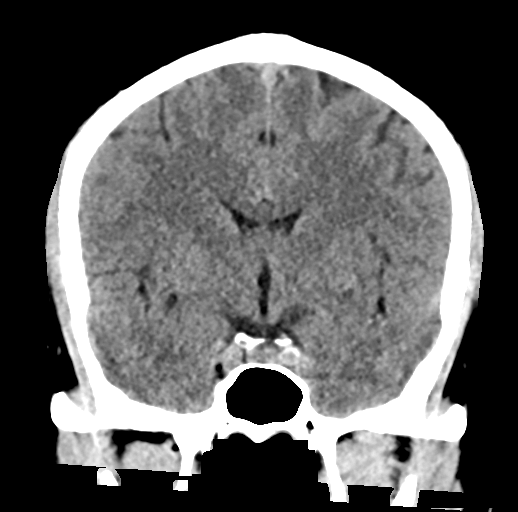
[im 37/67  brain]
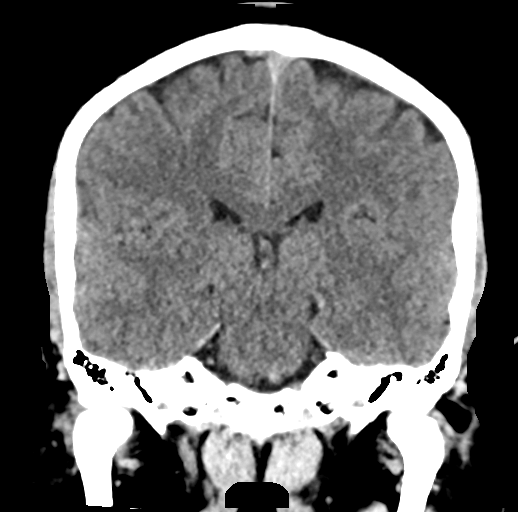

[Series 6: head wo true ax · axial · 0.33mm/px · z∈[-123,-71]mm · 2 of 34 slices shown]
[im 12/34  brain]
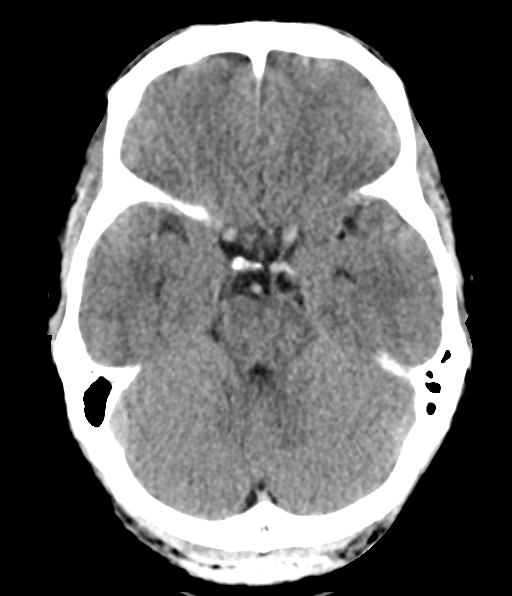
[im 23/34  brain]
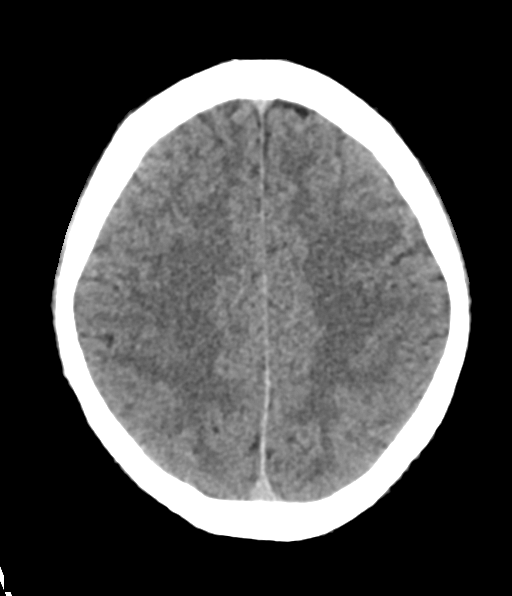

[Series 8: sag soft · sagittal · 0.33mm/px · 3 of 58 slices shown]
[im 20/58  brain]
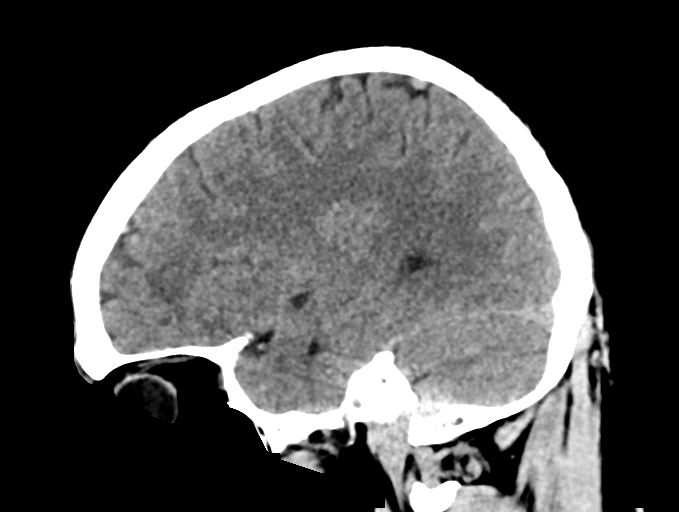
[im 29/58  brain]
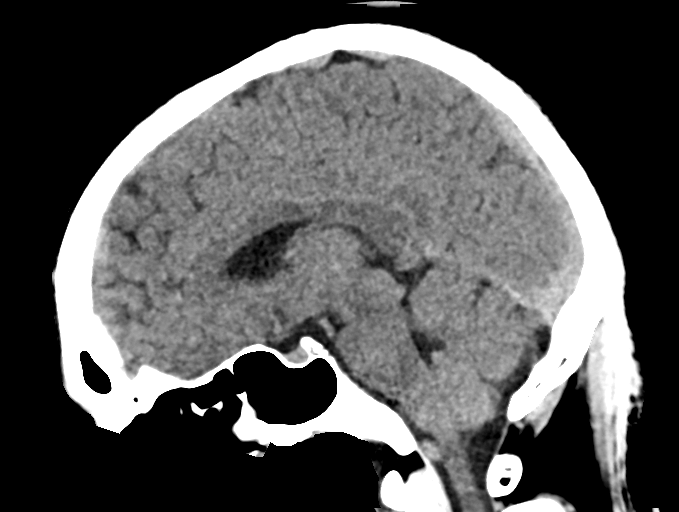
[im 39/58  brain]
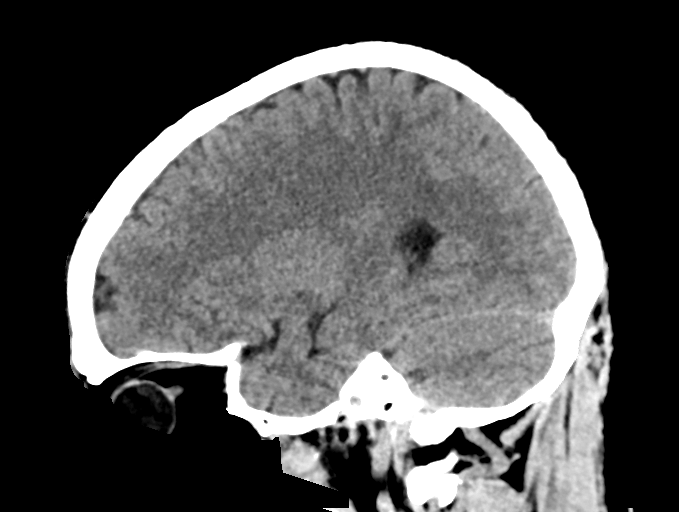

[17 of 47 positions shown; findings below may reference images not displayed]

FINDINGS: Brain: No acute intracranial abnormality. Specifically, no
hemorrhage, hydrocephalus, mass lesion, acute infarction, or
significant intracranial injury.

Vascular: No hyperdense vessel or unexpected calcification.

Skull: No acute calvarial abnormality.

Sinuses/Orbits: No acute findings

Other: None
IMPRESSION: No acute intracranial abnormality.

## 2024-01-03 ENCOUNTER — Observation Stay (HOSPITAL_COMMUNITY)
Admission: EM | Admit: 2024-01-03 | Discharge: 2024-01-06 | Disposition: A | Discharge status: Home / Self Care | Payer: BC Managed Care – PPO | Attending: Internal Medicine | Admitting: Internal Medicine

## 2024-01-03 ENCOUNTER — Other Ambulatory Visit: Payer: Self-pay

## 2024-01-03 ENCOUNTER — Emergency Department (HOSPITAL_COMMUNITY): Payer: BC Managed Care – PPO

## 2024-01-03 ENCOUNTER — Encounter (HOSPITAL_COMMUNITY): Payer: Self-pay

## 2024-01-03 DIAGNOSIS — Z79899 Other long term (current) drug therapy: Secondary | ICD-10-CM | POA: Diagnosis not present

## 2024-01-03 DIAGNOSIS — T68XXXA Hypothermia, initial encounter: Secondary | ICD-10-CM | POA: Diagnosis not present

## 2024-01-03 DIAGNOSIS — R61 Generalized hyperhidrosis: Secondary | ICD-10-CM | POA: Diagnosis not present

## 2024-01-03 DIAGNOSIS — R42 Dizziness and giddiness: Secondary | ICD-10-CM

## 2024-01-03 DIAGNOSIS — E872 Acidosis, unspecified: Secondary | ICD-10-CM | POA: Insufficient documentation

## 2024-01-03 DIAGNOSIS — E785 Hyperlipidemia, unspecified: Secondary | ICD-10-CM | POA: Insufficient documentation

## 2024-01-03 DIAGNOSIS — D72829 Elevated white blood cell count, unspecified: Secondary | ICD-10-CM | POA: Insufficient documentation

## 2024-01-03 DIAGNOSIS — I639 Cerebral infarction, unspecified: Principal | ICD-10-CM | POA: Insufficient documentation

## 2024-01-03 DIAGNOSIS — R739 Hyperglycemia, unspecified: Secondary | ICD-10-CM | POA: Diagnosis not present

## 2024-01-03 DIAGNOSIS — R110 Nausea: Secondary | ICD-10-CM | POA: Diagnosis not present

## 2024-01-03 DIAGNOSIS — R770 Abnormality of albumin: Secondary | ICD-10-CM | POA: Insufficient documentation

## 2024-01-03 DIAGNOSIS — R519 Headache, unspecified: Secondary | ICD-10-CM | POA: Diagnosis not present

## 2024-01-03 DIAGNOSIS — I4891 Unspecified atrial fibrillation: Secondary | ICD-10-CM | POA: Insufficient documentation

## 2024-01-03 DIAGNOSIS — N289 Disorder of kidney and ureter, unspecified: Secondary | ICD-10-CM | POA: Diagnosis not present

## 2024-01-03 DIAGNOSIS — Z7982 Long term (current) use of aspirin: Secondary | ICD-10-CM | POA: Insufficient documentation

## 2024-01-03 DIAGNOSIS — X31XXXA Exposure to excessive natural cold, initial encounter: Secondary | ICD-10-CM | POA: Insufficient documentation

## 2024-01-03 DIAGNOSIS — Z8679 Personal history of other diseases of the circulatory system: Secondary | ICD-10-CM

## 2024-01-03 DIAGNOSIS — I10 Essential (primary) hypertension: Secondary | ICD-10-CM | POA: Diagnosis not present

## 2024-01-03 DIAGNOSIS — E8809 Other disorders of plasma-protein metabolism, not elsewhere classified: Secondary | ICD-10-CM | POA: Insufficient documentation

## 2024-01-03 DIAGNOSIS — Z8616 Personal history of COVID-19: Secondary | ICD-10-CM | POA: Diagnosis not present

## 2024-01-03 HISTORY — DX: Acidosis, unspecified: E87.20

## 2024-01-03 HISTORY — DX: Cerebral infarction, unspecified: I63.9

## 2024-01-03 LAB — RESP PANEL BY RT-PCR (RSV, FLU A&B, COVID)  RVPGX2
Influenza A by PCR: NEGATIVE
Influenza B by PCR: NEGATIVE
Resp Syncytial Virus by PCR: NEGATIVE
SARS Coronavirus 2 by RT PCR: NEGATIVE

## 2024-01-03 LAB — CBC WITH DIFFERENTIAL/PLATELET
Abs Immature Granulocytes: 0.05 10*3/uL (ref 0.00–0.07)
Basophils Absolute: 0 10*3/uL (ref 0.0–0.1)
Basophils Relative: 0 %
Eosinophils Absolute: 0.7 10*3/uL — ABNORMAL HIGH (ref 0.0–0.5)
Eosinophils Relative: 7 %
HCT: 49.8 % (ref 39.0–52.0)
Hemoglobin: 16.1 g/dL (ref 13.0–17.0)
Immature Granulocytes: 0 %
Lymphocytes Relative: 9 %
Lymphs Abs: 1 10*3/uL (ref 0.7–4.0)
MCH: 27.2 pg (ref 26.0–34.0)
MCHC: 32.3 g/dL (ref 30.0–36.0)
MCV: 84 fL (ref 80.0–100.0)
Monocytes Absolute: 0.5 10*3/uL (ref 0.1–1.0)
Monocytes Relative: 4 %
Neutro Abs: 9 10*3/uL — ABNORMAL HIGH (ref 1.7–7.7)
Neutrophils Relative %: 80 %
Platelets: 151 10*3/uL (ref 150–400)
RBC: 5.93 MIL/uL — ABNORMAL HIGH (ref 4.22–5.81)
RDW: 13.5 % (ref 11.5–15.5)
WBC: 11.3 10*3/uL — ABNORMAL HIGH (ref 4.0–10.5)
nRBC: 0 % (ref 0.0–0.2)

## 2024-01-03 LAB — I-STAT CG4 LACTIC ACID, ED
Lactic Acid, Venous: 3.9 mmol/L (ref 0.5–1.9)
Lactic Acid, Venous: 2 mmol/L (ref 0.5–1.9)

## 2024-01-03 LAB — URINALYSIS, W/ REFLEX TO CULTURE (INFECTION SUSPECTED)
Bilirubin Urine: NEGATIVE
Glucose, UA: NEGATIVE mg/dL
Ketones, ur: NEGATIVE mg/dL
Leukocytes,Ua: NEGATIVE
Nitrite: NEGATIVE
Protein, ur: NEGATIVE mg/dL
Specific Gravity, Urine: 1.011 (ref 1.005–1.030)
pH: 6 (ref 5.0–8.0)

## 2024-01-03 LAB — PROTIME-INR
INR: 1 (ref 0.8–1.2)
Prothrombin Time: 13.4 s (ref 11.4–15.2)

## 2024-01-03 LAB — COMPREHENSIVE METABOLIC PANEL
ALT: 23 U/L (ref 0–44)
AST: 31 U/L (ref 15–41)
Albumin: 4 g/dL (ref 3.5–5.0)
Alkaline Phosphatase: 69 U/L (ref 38–126)
Anion gap: 11 (ref 5–15)
BUN: 15 mg/dL (ref 6–20)
CO2: 22 mmol/L (ref 22–32)
Calcium: 9.3 mg/dL (ref 8.9–10.3)
Chloride: 103 mmol/L (ref 98–111)
Creatinine, Ser: 1.34 mg/dL — ABNORMAL HIGH (ref 0.61–1.24)
GFR, Estimated: >60 mL/min (ref >60)
Glucose, Bld: 154 mg/dL — ABNORMAL HIGH (ref 70–99)
Potassium: 4.7 mmol/L (ref 3.5–5.1)
Sodium: 136 mmol/L (ref 135–145)
Total Bilirubin: 0.6 mg/dL (ref 0.0–1.2)
Total Protein: 7.8 g/dL (ref 6.5–8.1)

## 2024-01-03 LAB — CBG MONITORING, ED: Glucose-Capillary: 163 mg/dL — ABNORMAL HIGH (ref 70–99)

## 2024-01-03 LAB — APTT: aPTT: 28 s (ref 24–36)

## 2024-01-03 MED ORDER — DIAZEPAM 5 MG/ML IJ SOLN
5.0000 mg | Freq: Once | INTRAMUSCULAR | Status: AC
Start: 2024-01-03 — End: 2024-01-03
  Administered 2024-01-03: 5 mg via INTRAVENOUS
  Filled 2024-01-03: qty 2

## 2024-01-03 MED ORDER — MECLIZINE HCL 25 MG PO TABS
25.0000 mg | ORAL_TABLET | Freq: Once | ORAL | Status: AC
  Administered 2024-01-03: 25 mg via ORAL
  Filled 2024-01-03: qty 1

## 2024-01-03 MED ORDER — SODIUM CHLORIDE 0.9 % IV SOLN: 2.0000 g | Freq: Once | INTRAVENOUS | Status: DC

## 2024-01-03 MED ORDER — METRONIDAZOLE 500 MG/100ML IV SOLN: 500.0000 mg | Freq: Once | INTRAVENOUS | Status: DC

## 2024-01-03 MED ORDER — SODIUM CHLORIDE 0.9 % IV BOLUS
1000.0000 mL | Freq: Once | INTRAVENOUS | Status: AC
  Administered 2024-01-03: 1000 mL via INTRAVENOUS

## 2024-01-03 MED ORDER — VANCOMYCIN HCL IN DEXTROSE 1-5 GM/200ML-% IV SOLN: 1000.0000 mg | Freq: Once | INTRAVENOUS | Status: DC

## 2024-01-03 MED ORDER — GADOBUTROL 1 MMOL/ML IV SOLN
7.0000 mL | Freq: Once | INTRAVENOUS | Status: AC | PRN
  Administered 2024-01-03: 7 mL via INTRAVENOUS

## 2024-01-03 MED ORDER — LORAZEPAM 1 MG PO TABS
0.5000 mg | ORAL_TABLET | Freq: Once | ORAL | Status: DC
  Filled 2024-01-03: qty 1

## 2024-01-03 MED ORDER — LACTATED RINGERS IV BOLUS
1000.0000 mL | Freq: Once | INTRAVENOUS | Status: AC
  Administered 2024-01-03: 1000 mL via INTRAVENOUS

## 2024-01-03 NOTE — ED Provider Notes (Signed)
 Received patient at signout from previous provider.  See his note.  In short, non english speaking patient presents to emergency department for evaluation of intermittent dizziness described as room spinning sensation that started 2 days ago.  Dizziness improves with supine position.  ED workup is significant for hypothermia of 95.6 F and initial lactic acid of 3.9.  Patient denies infectious symptoms but BC and UA collected due to elevated lactic and hypothermia.  In ED, was provided Valium, meclizine for dizziness with some improvement however dizziness persists.  Second lactic is 2. 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  Neurology who recommended hospitalist admission  Consulted hospitalist and spoke to Dr.Timothy Opyd Then discussed ED workup, imaging, neurology consult. He accepts patient for admission     Judithann Sheen, [place] 01/03/24 2321    Elayne Snare K, DO 01/03/24 2326

## 2024-01-03 NOTE — ED Triage Notes (Signed)
 Pt c.o sudden onset of dizziness while driving with 2 episodes of emesis. This has been intermittent x 2 days, hx of vertigo. 2 emesis episodes with EMS. Pt given 4mg  zofran.  Denies headache. PERRLA

## 2024-01-03 NOTE — ED Provider Notes (Signed)
 [facility] AT [facility] Provider Note   CSN: [id number] Arrival date & time: 01/03/24  1158     History  No chief complaint on file.  HPI Melvin Mills is a 53 y.o. male presenting for dizziness.  Started 2 days ago.  It is intermittent.  Associate some room spinning sensation.  It is improved with lying down.  Denies headache or visual disturbance.  Denies weakness or numbness in extremities.  Denies chest pain, palpitations shortness of breath.  Denies recent nausea vomiting diarrhea.  Denies URI symptoms.  States he was here a couple of years ago for similar complaint had a reassuring workup.   HPI     Home Medications Prior to Admission medications   Medication Sig Start Date End Date Taking? Authorizing Provider  acetaminophen (TYLENOL) 160 MG/5ML solution Take 20.3 mLs (650 mg total) by mouth every 6 (six) hours as needed for mild pain, headache or fever. Patient not taking: Reported on 11/21/2021 04/30/19   Lonia Blood, MD  aspirin 325 MG tablet Take 1 tablet (325 mg total) by mouth daily. Patient not taking: Reported on 11/21/2021 05/01/19   Lonia Blood, MD  furosemide (LASIX) 40 MG tablet Take 1 tablet (40 mg total) by mouth 2 (two) times daily for 4 days. Patient not taking: Reported on 11/21/2021 04/30/19 05/04/19  Lonia Blood, MD  meclizine (ANTIVERT) 25 MG tablet Take 1 tablet (25 mg total) by mouth 3 (three) times daily as needed for dizziness. 11/21/21   Curatolo, Adam, DO  metoprolol tartrate (LOPRESSOR) 25 MG tablet Take 0.5 tablets (12.5 mg total) by mouth 2 (two) times daily. Patient not taking: Reported on 11/21/2021 04/30/19   Lonia Blood, MD  ondansetron (ZOFRAN) 4 MG tablet Take 1 tablet (4 mg total) by mouth every 6 (six) hours. 11/21/21   Curatolo, Adam, DO  tamsulosin (FLOMAX) 0.4 MG CAPS capsule Take 1 capsule (0.4 mg total) by mouth daily after breakfast. Patient not taking: Reported on 11/21/2021 05/01/19    Lonia Blood, MD      Allergies    Patient has no known allergies.    Review of Systems   See HPI for pertinent positives   Physical Exam   Vitals:   01/03/24 1500 01/03/24 1515  BP: 123/77 124/76  Pulse: 73 70  Resp:    Temp:    SpO2: 99% 100%    CONSTITUTIONAL:  well-appearing, NAD NEURO:  GCS 15. Speech is goal oriented. No deficits appreciated to CN III-XII; symmetric eyebrow raise, no facial drooping, tongue midline. Patient has equal grip strength bilaterally with 5/5 strength against resistance in all major muscle groups bilaterally. Sensation to light touch intact. Patient moves extremities without ataxia. Normal finger-nose-finger. Did not assess gait, patient refused d/t dizziness.  EYES:  eyes equal and reactive ENT/NECK:  Supple, no stridor  CARDIO:  regular rate and rhythm, appears well-perfused  PULM:  No respiratory distress, CTAB GI/GU:  non-distended, soft, non tender MSK/SPINE:  No gross deformities, no edema, moves all extremities  SKIN:  no rash, atraumatic  *Additional and/or pertinent findings included in MDM below   ED Results / Procedures / Treatments   Labs (all labs ordered are listed, but only abnormal results are displayed) Labs Reviewed  COMPREHENSIVE METABOLIC PANEL - Abnormal; Notable for the following components:      Result Value   Glucose, Bld 154 (*)    Creatinine, Ser 1.34 (*)    All other components  within normal limits  CBC WITH DIFFERENTIAL/PLATELET - Abnormal; Notable for the following components:   WBC 11.3 (*)    RBC 5.93 (*)    Neutro Abs 9.0 (*)    Eosinophils Absolute 0.7 (*)    All other components within normal limits  I-STAT CG4 LACTIC ACID, ED - Abnormal; Notable for the following components:   Lactic Acid, Venous 3.9 (*)    All other components within normal limits  CBG MONITORING, ED - Abnormal; Notable for the following components:   Glucose-Capillary 163 (*)    All other components within normal limits   RESP PANEL BY RT-PCR (RSV, FLU A&B, COVID)  RVPGX2  CULTURE, BLOOD (ROUTINE X 2)  CULTURE, BLOOD (ROUTINE X 2)  PROTIME-INR  APTT  URINALYSIS, W/ REFLEX TO CULTURE (INFECTION SUSPECTED)  I-STAT CG4 LACTIC ACID, ED    EKG EKG Interpretation Date/Time:  Saturday January 03 2024 13:06:05 EST Ventricular Rate:  58 PR Interval:  141 QRS Duration:  101 QT Interval:  434 QTC Calculation: 427 R Axis:   66  Text Interpretation: Sinus rhythm Probable left atrial enlargement Borderline T wave abnormalities No significant change since last tracing Confirmed by Gwyneth Sprout ([id number]) on 01/03/2024 1:52:33 PM  Radiology DG Chest Port 1 View Result Date: 01/03/2024 CLINICAL DATA:  Sepsis. EXAM: PORTABLE CHEST 1 VIEW COMPARISON:  11/21/2021 FINDINGS: The heart size and mediastinal contours are within normal limits. Low lung volumes again noted. Both lungs are clear. The visualized skeletal structures are unremarkable. IMPRESSION: Low lung volumes. No active disease. Electronically Signed   By: Danae Orleans M.D.   On: 01/03/2024 13:28    Procedures Procedures    Medications Ordered in ED Medications  sodium chloride 0.9 % bolus 1,000 mL (0 mLs Intravenous Stopped 01/03/24 1516)  meclizine (ANTIVERT) tablet 25 mg (25 mg Oral Given 01/03/24 1308)  diazepam (VALIUM) injection 5 mg (5 mg Intravenous Given 01/03/24 1454)    ED Course/ Medical Decision Making/ A&P Clinical Course as of 01/03/24 1524  Sat Jan 03, 2024  1241 Resp panel by RT-PCR (RSV, Flu A&B, Covid) Anterior Nasal Swab [JR]    Clinical Course User Index [JR] Gareth Eagle, PA-C                                 Medical Decision Making Amount and/or Complexity of Data Reviewed Labs: ordered. Decision-making details documented in ED Course. Radiology: ordered.  Risk Prescription drug management.   Initial Impression and Ddx 53 year old well-appearing male presenting for dizziness.  Exam was unremarkable but he was  initially hypothermic.  Initial rectal temp was 95.6 F.  DDx includes sepsis, stroke, hypothermia, electrolyte derangement, arrhythmia, other. Patient PMH that increases complexity of ED encounter: Has had history of vertigo  Interpretation of Diagnostics - I independent reviewed and interpreted the labs as followed: Lactic acidosis (3.9), leukocytosis (11.3)  - I independently visualized the following imaging with scope of interpretation limited to determining acute life threatening conditions related to emergency care: CXR, which revealed no acute finding  -I personally reviewed and interpreted EKG which revealed sinus rhythm  Patient Reassessment and Ultimate Disposition/Management On reassessment, patient stated that his dizziness had improved considerably after Valium, meclizine and fluid resuscitation.  Overall remains well-appearing and is now normothermic.  Plan for now is to reassess second lactate.  If below 2 kinking and consider discharge with meclizine and PCP follow-up.  If lactic acidosis is  persistently elevated, would consider admission for sepsis of unknown origin and initiate IV antibiotics. Signed out patient to PA Sabra Heck.   Patient management required discussion with the following services or consulting groups:  None  Complexity of Problems Addressed Acute complicated illness or Injury  Additional Data Reviewed and Analyzed Further history obtained from: Past medical history and medications listed in the EMR and Prior ED visit notes  Patient Encounter Risk Assessment Consideration of hospitalization         Final Clinical Impression(s) / ED Diagnoses Final diagnoses:  Dizziness  Hypothermia, initial encounter    Rx / DC Orders ED Discharge Orders     None         Gareth Eagle, PA-C 01/03/24 1525    Gwyneth Sprout, MD 01/08/24 [phone]

## 2024-01-03 NOTE — ED Notes (Signed)
 Rectal temp 95.6, bare hugger applied

## 2024-01-04 ENCOUNTER — Encounter (HOSPITAL_COMMUNITY): Payer: Self-pay | Admitting: Family Medicine

## 2024-01-04 ENCOUNTER — Observation Stay (HOSPITAL_COMMUNITY): Payer: BC Managed Care – PPO

## 2024-01-04 ENCOUNTER — Observation Stay (HOSPITAL_BASED_OUTPATIENT_CLINIC_OR_DEPARTMENT_OTHER): Payer: BC Managed Care – PPO

## 2024-01-04 DIAGNOSIS — I6389 Other cerebral infarction: Secondary | ICD-10-CM | POA: Diagnosis not present

## 2024-01-04 DIAGNOSIS — E872 Acidosis, unspecified: Secondary | ICD-10-CM | POA: Diagnosis not present

## 2024-01-04 DIAGNOSIS — D72829 Elevated white blood cell count, unspecified: Secondary | ICD-10-CM | POA: Diagnosis not present

## 2024-01-04 DIAGNOSIS — N289 Disorder of kidney and ureter, unspecified: Secondary | ICD-10-CM | POA: Diagnosis not present

## 2024-01-04 DIAGNOSIS — I639 Cerebral infarction, unspecified: Secondary | ICD-10-CM | POA: Diagnosis not present

## 2024-01-04 LAB — BASIC METABOLIC PANEL
Anion gap: 9 (ref 5–15)
BUN: 15 mg/dL (ref 6–20)
CO2: 22 mmol/L (ref 22–32)
Calcium: 8.7 mg/dL — ABNORMAL LOW (ref 8.9–10.3)
Chloride: 106 mmol/L (ref 98–111)
Creatinine, Ser: 1.22 mg/dL (ref 0.61–1.24)
GFR, Estimated: >60 mL/min (ref >60)
Glucose, Bld: 95 mg/dL (ref 70–99)
Potassium: 3.5 mmol/L (ref 3.5–5.1)
Sodium: 137 mmol/L (ref 135–145)

## 2024-01-04 LAB — LIPID PANEL
Cholesterol: 212 mg/dL — ABNORMAL HIGH (ref 0–200)
HDL: 36 mg/dL — ABNORMAL LOW (ref >40)
LDL Cholesterol: 154 mg/dL — ABNORMAL HIGH (ref 0–99)
Total CHOL/HDL Ratio: 5.9 ratio
Triglycerides: 108 mg/dL (ref <150)
VLDL: 22 mg/dL (ref 0–40)

## 2024-01-04 LAB — CBC
HCT: 46.2 % (ref 39.0–52.0)
Hemoglobin: 14.9 g/dL (ref 13.0–17.0)
MCH: 27 pg (ref 26.0–34.0)
MCHC: 32.3 g/dL (ref 30.0–36.0)
MCV: 83.7 fL (ref 80.0–100.0)
Platelets: 158 10*3/uL (ref 150–400)
RBC: 5.52 MIL/uL (ref 4.22–5.81)
RDW: 13.7 % (ref 11.5–15.5)
WBC: 9 10*3/uL (ref 4.0–10.5)
nRBC: 0 % (ref 0.0–0.2)

## 2024-01-04 LAB — ECHOCARDIOGRAM COMPLETE
Area-P 1/2: 2.29 cm2
S' Lateral: 3.1 cm

## 2024-01-04 LAB — HEMOGLOBIN A1C
Hgb A1c MFr Bld: 5.6 % (ref 4.8–5.6)
Mean Plasma Glucose: 114.02 mg/dL

## 2024-01-04 LAB — HIV ANTIBODY (ROUTINE TESTING W REFLEX): HIV Screen 4th Generation wRfx: NONREACTIVE

## 2024-01-04 LAB — LACTIC ACID, PLASMA: Lactic Acid, Venous: 1 mmol/L (ref 0.5–1.9)

## 2024-01-04 MED ORDER — CLOPIDOGREL BISULFATE 300 MG PO TABS
300.0000 mg | ORAL_TABLET | Freq: Once | ORAL | Status: AC
  Administered 2024-01-04: 300 mg via ORAL
  Filled 2024-01-04: qty 1

## 2024-01-04 MED ORDER — ENOXAPARIN SODIUM 40 MG/0.4ML IJ SOSY
40.0000 mg | PREFILLED_SYRINGE | INTRAMUSCULAR | Status: DC
  Administered 2024-01-04 – 2024-01-06 (×3): 40 mg via SUBCUTANEOUS
  Filled 2024-01-04 (×3): qty 0.4

## 2024-01-04 MED ORDER — ACETAMINOPHEN 160 MG/5ML PO SOLN: 650.0000 mg | ORAL | Status: DC | PRN

## 2024-01-04 MED ORDER — STROKE: EARLY STAGES OF RECOVERY BOOK
Freq: Once | Status: AC
  Administered 2024-01-05: 1
  Filled 2024-01-04: qty 1

## 2024-01-04 MED ORDER — ACETAMINOPHEN 325 MG PO TABS
650.0000 mg | ORAL_TABLET | ORAL | Status: DC | PRN
  Administered 2024-01-04: 650 mg via ORAL
  Filled 2024-01-04: qty 2

## 2024-01-04 MED ORDER — SENNOSIDES-DOCUSATE SODIUM 8.6-50 MG PO TABS: 1.0000 | ORAL_TABLET | Freq: Every evening | ORAL | Status: DC | PRN

## 2024-01-04 MED ORDER — MECLIZINE HCL 25 MG PO TABS
25.0000 mg | ORAL_TABLET | Freq: Three times a day (TID) | ORAL | Status: DC | PRN
Start: 2024-01-04 — End: 2024-01-06
  Administered 2024-01-04 (×3): 25 mg via ORAL
  Filled 2024-01-04 (×4): qty 1

## 2024-01-04 MED ORDER — CLOPIDOGREL BISULFATE 75 MG PO TABS
75.0000 mg | ORAL_TABLET | Freq: Every day | ORAL | Status: DC
  Administered 2024-01-05 – 2024-01-06 (×2): 75 mg via ORAL
  Filled 2024-01-04 (×2): qty 1

## 2024-01-04 MED ORDER — DIAZEPAM 2 MG PO TABS: 2.0000 mg | ORAL_TABLET | Freq: Two times a day (BID) | ORAL | Status: DC | PRN

## 2024-01-04 MED ORDER — ATORVASTATIN CALCIUM 80 MG PO TABS
80.0000 mg | ORAL_TABLET | Freq: Every day | ORAL | Status: DC
  Administered 2024-01-04 – 2024-01-05 (×2): 80 mg via ORAL
  Filled 2024-01-04 (×2): qty 1

## 2024-01-04 MED ORDER — IOHEXOL 350 MG/ML SOLN
75.0000 mL | Freq: Once | INTRAVENOUS | Status: AC | PRN
  Administered 2024-01-04: 75 mL via INTRAVENOUS

## 2024-01-04 MED ORDER — ACETAMINOPHEN 650 MG RE SUPP: 650.0000 mg | RECTAL | Status: DC | PRN

## 2024-01-04 MED ORDER — SODIUM CHLORIDE 0.9 % IV SOLN: INTRAVENOUS | Status: AC

## 2024-01-04 MED ORDER — ONDANSETRON HCL 4 MG/2ML IJ SOLN: 4.0000 mg | Freq: Four times a day (QID) | INTRAMUSCULAR | Status: DC | PRN

## 2024-01-04 MED ORDER — ASPIRIN 81 MG PO TBEC
81.0000 mg | DELAYED_RELEASE_TABLET | Freq: Every day | ORAL | Status: DC
  Administered 2024-01-04 – 2024-01-06 (×3): 81 mg via ORAL
  Filled 2024-01-04 (×3): qty 1

## 2024-01-04 NOTE — Progress Notes (Signed)
 STROKE TEAM PROGRESS NOTE   INTERIM HISTORY/SUBJECTIVE  Daughter at bedside.  Patient sitting up in bed on exam.  Patient states dizziness started Friday morning.  Denies double vision.  No visual deficits on exam.   Patient states that he is continuing to have dizziness and nausea, does have Valium as Zofran as needed  OBJECTIVE  CBC    Component Value Date/Time   WBC 9.0 01/04/2024 0419   RBC 5.52 01/04/2024 0419   HGB 14.9 01/04/2024 0419   HCT 46.2 01/04/2024 0419   PLT 158 01/04/2024 0419   MCV 83.7 01/04/2024 0419   MCH 27.0 01/04/2024 0419   MCHC 32.3 01/04/2024 0419   RDW 13.7 01/04/2024 0419   LYMPHSABS 1.0 01/03/2024 1242   MONOABS 0.5 01/03/2024 1242   EOSABS 0.7 (H) 01/03/2024 1242   BASOSABS 0.0 01/03/2024 1242    BMET    Component Value Date/Time   NA 137 01/04/2024 0419   K 3.5 01/04/2024 0419   CL 106 01/04/2024 0419   CO2 22 01/04/2024 0419   GLUCOSE 95 01/04/2024 0419   BUN 15 01/04/2024 0419   CREATININE 1.22 01/04/2024 0419   CALCIUM 8.7 (L) 01/04/2024 0419   GFRNONAA >60 01/04/2024 0419    IMAGING past 24 hours CT ANGIO HEAD NECK W WO CM Result Date: 01/04/2024 CLINICAL DATA:  Stroke/TIA EXAM: CT ANGIOGRAPHY HEAD AND NECK WITH AND WITHOUT CONTRAST TECHNIQUE: Multidetector CT imaging of the head and neck was performed using the standard protocol during bolus administration of intravenous contrast. Multiplanar CT image reconstructions and MIPs were obtained to evaluate the vascular anatomy. Carotid stenosis measurements (when applicable) are obtained utilizing NASCET criteria, using the distal internal carotid diameter as the denominator. RADIATION DOSE REDUCTION: This exam was performed according to the departmental dose-optimization program which includes automated exposure control, adjustment of the mA and/or kV according to patient size and/or use of iterative reconstruction technique. CONTRAST:  75mL OMNIPAQUE IOHEXOL 350 MG/ML SOLN COMPARISON:  None  Available. FINDINGS: CT HEAD FINDINGS Brain: No mass,hemorrhage or extra-axial collection. Normal appearance of the parenchyma and CSF spaces. Vascular: No hyperdense vessel or unexpected vascular calcification. Skull: The visualized skull base, calvarium and extracranial soft tissues are normal. Sinuses/Orbits: No fluid levels or advanced mucosal thickening of the visualized paranasal sinuses. No mastoid or middle ear effusion. Normal orbits. CTA NECK FINDINGS Skeleton: No acute abnormality or high grade bony spinal canal stenosis. Other neck: Normal pharynx, larynx and major salivary glands. No cervical lymphadenopathy. Unremarkable thyroid gland. Upper chest: No pneumothorax or pleural effusion. No nodules or masses. Aortic arch: There is no calcific atherosclerosis of the aortic arch. Conventional 3 vessel aortic branching pattern. RIGHT carotid system: Normal without aneurysm, dissection or stenosis. LEFT carotid system: Normal without aneurysm, dissection or stenosis. Vertebral arteries: Right dominant configuration. There is no dissection, occlusion or flow-limiting stenosis to the skull base (V1-V3 segments). CTA HEAD FINDINGS POSTERIOR CIRCULATION: The left vertebral artery terminates in PICA. There is severe stenosis of the right vertebral artery at the vertebrobasilar junction. Short segment occlusion at the origin of the right AICA. Basilar artery is normal. Superior cerebellar arteries are normal. Posterior cerebral arteries are normal. ANTERIOR CIRCULATION: Intracranial internal carotid arteries are normal. Anterior cerebral arteries are normal. 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 Mild stenosis of the right MCA M1 segment. Venous sinuses: As permitted by contrast timing, patent. Anatomic variants: Fetal origin of the left posterior cerebral artery. Review of the MIP images confirms the above findings.  IMPRESSION: 1. 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 2. 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 3. Short segment occlusion at the origin of the right AICA. Electronically Signed   By: Deatra Robinson M.D.   On: 01/04/2024 01:46   MR Brain W and Wo Contrast Result Date: 01/03/2024 CLINICAL DATA:  Acute neurologic deficit.  Dizziness for 2 days. EXAM: MRI HEAD WITHOUT AND WITH CONTRAST TECHNIQUE: Multiplanar, multiecho pulse sequences of the brain and surrounding structures were obtained without and with intravenous contrast. CONTRAST:  7mL GADAVIST GADOBUTROL 1 MMOL/ML IV SOLN COMPARISON:  None Available. FINDINGS: Brain: There are multiple punctate foci of abnormal diffusion restriction within the right cerebellar hemisphere and right occipital lobe. No acute or chronic hemorrhage. Normal white matter signal, parenchymal volume and CSF spaces. The midline structures are normal. There is no abnormal contrast enhancement. Vascular: Normal flow voids. Skull and upper cervical spine: Normal calvarium and skull base. Visualized upper cervical spine and soft tissues are normal. Sinuses/Orbits:No paranasal sinus fluid levels or advanced mucosal thickening. No mastoid or middle ear effusion. Normal orbits. IMPRESSION: Multiple punctate acute infarcts within the right cerebellar hemisphere and right occipital lobe. No hemorrhage or mass effect. Electronically Signed   By: Deatra Robinson M.D.   On: 01/03/2024 22:33    Vitals:   01/04/24 2130 01/04/24 0805 01/04/24 1122 01/04/24 1436  BP:  (!) 142/75 (!) 145/66 (!) 155/82  Pulse:  62 67 (!) 59  Resp:  (!) 24 19 (!) 21  Temp: 98.2 F (36.8 C) 98.3 F (36.8 C) 98.4 F (36.9 C) 98.5 F (36.9 C)  TempSrc: Oral Oral Oral Oral  SpO2:  100% 100% 100%     PHYSICAL EXAM General:  Alert, well-nourished, well-developed patient in no acute distress Psych:  Mood and affect appropriate for situation CV: Regular rate and rhythm on monitor Respiratory:  Regular, unlabored respirations on room air GI: Abdomen soft  and nontender   NEURO:  Mental Status: AA&Ox3, patient is able to give clear and coherent history Speech/Language: speech is without dysarthria or aphasia.  Naming, repetition, fluency, and comprehension intact.  Cranial Nerves:  II: PERRL. Visual fields full.  III, IV, VI: EOMI. Eyelids elevate symmetrically.  V: Sensation is intact to light touch and symmetrical to face.  VII: Face is symmetrical resting and smiling VIII: hearing intact to voice. IX, X: Palate elevates symmetrically. Phonation is normal.  QM:VHQIONGE shrug 5/5. XII: tongue is midline without fasciculations. Motor: 5/5 strength to all muscle groups tested.  Tone: is normal and bulk is normal Sensation- Intact to light touch bilaterally. Extinction absent to light touch to DSS.   Coordination: FTN intact bilaterally, HKS: no ataxia in BLE.No drift.  Gait- deferred  Most Recent NIH: 0     ASSESSMENT/PLAN  Melvin Mills is a 53 y.o. male with unknown past medical history admitted with multifocal posterior circulation strokes.  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   Hyperlipidemia Home meds:  none LDL 154, goal < 70 Add Lipitor 80 mg Continue statin at  discharge  Diabetes type II, no history Home meds:  none HgbA1c 5.6, goal < 7.0 CBGs SSI Recommend close follow-up with PCP   Hospital day # 0   Pt seen by Neuro NP/APP and later by MD. Note/plan to be edited by MD as needed.    Lynnae January, DNP, AGACNP-BC [facility] Please use AMION for contact information & EPIC for messaging.   I, the attending vascular neurologist, have personally obtained a history, examined the patient, evaluated laboratory data, individually viewed imaging studies and agree with radiology interpretations. I obtained additional history from pt's daughter at bedside. Together with the NP/PA, we formulated the assessment and plan of care which reflects our mutual decision.  I have made any additions or clarifications directly to the above note and agree with the findings and plan as currently documented.    Windell Norfolk, MD  Neurology 01/04/2024 4:25 PM     To contact Stroke Continuity provider, please refer to [email or web address]. After hours, contact General Neurology

## 2024-01-04 NOTE — H&P (Addendum)
 History and Physical    Melvin Mills ZOX:[id number] DOB: [birth year] DOA: 01/03/2024  PCP: Grayce Sessions, NP   Patient coming from: Home   Chief Complaint: Dizzy, N/V   HPI: Melvin Mills is a 53 y.o. male who denies any significant past medical history, states that he does not take any medications, and presents with dizziness, nausea, and vomiting.  Patient reports that he developed a room spinning sensation with nausea, nonbloody vomiting, and mild headache 2 days ago.  Symptoms are better when resting and supine position and worse when upright and active.  He was feeling generally weak earlier but not currently, has not noticed any focal weakness, and denies numbness or tingling.  ED Course: Upon arrival to the ED, patient is found to be afebrile and saturating well on room air with stable blood pressure.  EKG demonstrates sinus rhythm and chest x-ray is negative for acute findings.  Labs are most notable for lactic acid 3.9, WBC 11,300, creatinine 1.34, and negative respiratory virus panel.  MRI brain reveals multiple punctate acute infarcts within right cerebellar hemisphere and right occipital lobe.  Neurology was consulted by the ED physician, blood cultures were collected, and the patient was treated with 1 L LR, 1 L NS, IV Valium, and meclizine.  Review of Systems:  All other systems reviewed and apart from HPI, are negative.  Past Medical History:  Diagnosis Date   COVID-19 virus infection 04/08/2019    History reviewed. No pertinent surgical history.  Social History:   reports that he has never smoked. He has never used smokeless tobacco. He reports that he does not drink alcohol and does not use drugs.  No Known Allergies  History reviewed. No pertinent family history.   Prior to Admission medications   Medication Sig Start Date End Date Taking? Authorizing Provider  acetaminophen (TYLENOL) 160 MG/5ML solution Take 20.3 mLs (650 mg total) by mouth every 6  (six) hours as needed for mild pain, headache or fever. Patient not taking: Reported on 11/21/2021 04/30/19   Lonia Blood, MD  aspirin 325 MG tablet Take 1 tablet (325 mg total) by mouth daily. Patient not taking: Reported on 11/21/2021 05/01/19   Lonia Blood, MD  furosemide (LASIX) 40 MG tablet Take 1 tablet (40 mg total) by mouth 2 (two) times daily for 4 days. Patient not taking: Reported on 11/21/2021 04/30/19 05/04/19  Lonia Blood, MD  meclizine (ANTIVERT) 25 MG tablet Take 1 tablet (25 mg total) by mouth 3 (three) times daily as needed for dizziness. 11/21/21   Curatolo, Adam, DO  metoprolol tartrate (LOPRESSOR) 25 MG tablet Take 0.5 tablets (12.5 mg total) by mouth 2 (two) times daily. Patient not taking: Reported on 11/21/2021 04/30/19   Lonia Blood, MD  ondansetron (ZOFRAN) 4 MG tablet Take 1 tablet (4 mg total) by mouth every 6 (six) hours. 11/21/21   Curatolo, Adam, DO  tamsulosin (FLOMAX) 0.4 MG CAPS capsule Take 1 capsule (0.4 mg total) by mouth daily after breakfast. Patient not taking: Reported on 11/21/2021 05/01/19   Lonia Blood, MD    Physical Exam: Vitals:   01/03/24 1847 01/03/24 1848 01/03/24 2115 01/03/24 2229  BP:  (!) 140/74 135/77 (!) 165/95  Pulse: 60 (!) 56 60 (!) 55  Resp:  18 18 20   Temp:  99.3 F (37.4 C)  99 F (37.2 C)  TempSrc:  Oral  Oral  SpO2: 100% 100% 100% 100%    Constitutional: NAD, calm  Eyes: PERTLA,  lids and conjunctivae normal ENMT: Mucous membranes are moist. Posterior pharynx clear of any exudate or lesions.   Neck: supple, no masses  Respiratory: no wheezing, no crackles. No accessory muscle use.  Cardiovascular: S1 & S2 heard, regular rate and rhythm. No extremity edema.   Abdomen: no tenderness, soft. Bowel sounds active.  Musculoskeletal: no clubbing / cyanosis. No joint deformity upper and lower extremities.   Skin: no significant rashes, lesions, ulcers. Warm, dry, well-perfused. Neurologic: CN 2-12 grossly  intact. Sensation intact. Strength 5/5 in all 4 limbs. Alert and oriented.  Psychiatric: Pleasant. Cooperative.    Labs and Imaging on Admission: I have personally reviewed following labs and imaging studies  CBC: Recent Labs  Lab 01/03/24 1242  WBC 11.3*  NEUTROABS 9.0*  HGB 16.1  HCT 49.8  MCV 84.0  PLT 151   Basic Metabolic Panel: Recent Labs  Lab 01/03/24 1242  NA 136  K 4.7  CL 103  CO2 22  GLUCOSE 154*  BUN 15  CREATININE 1.34*  CALCIUM 9.3   GFR: CrCl cannot be calculated (Unknown ideal weight.). Liver Function Tests: Recent Labs  Lab 01/03/24 1242  AST 31  ALT 23  ALKPHOS 69  BILITOT 0.6  PROT 7.8  ALBUMIN 4.0   No results for input(s): "LIPASE", "AMYLASE" in the last 168 hours. No results for input(s): "AMMONIA" in the last 168 hours. Coagulation Profile: Recent Labs  Lab 01/03/24 1242  INR 1.0   Cardiac Enzymes: No results for input(s): "CKTOTAL", "CKMB", "CKMBINDEX", "TROPONINI" in the last 168 hours. BNP (last 3 results) No results for input(s): "PROBNP" in the last 8760 hours. HbA1C: No results for input(s): "HGBA1C" in the last 72 hours. CBG: Recent Labs  Lab 01/03/24 1244  GLUCAP 163*   Lipid Profile: No results for input(s): "CHOL", "HDL", "LDLCALC", "TRIG", "CHOLHDL", "LDLDIRECT" in the last 72 hours. Thyroid Function Tests: No results for input(s): "TSH", "T4TOTAL", "FREET4", "T3FREE", "THYROIDAB" in the last 72 hours. Anemia Panel: No results for input(s): "VITAMINB12", "FOLATE", "FERRITIN", "TIBC", "IRON", "RETICCTPCT" in the last 72 hours. Urine analysis:    Component Value Date/Time   COLORURINE YELLOW 01/03/2024 1230   APPEARANCEUR CLEAR 01/03/2024 1230   LABSPEC 1.011 01/03/2024 1230   PHURINE 6.0 01/03/2024 1230   GLUCOSEU NEGATIVE 01/03/2024 1230   HGBUR SMALL (A) 01/03/2024 1230   BILIRUBINUR NEGATIVE 01/03/2024 1230   KETONESUR NEGATIVE 01/03/2024 1230   PROTEINUR NEGATIVE 01/03/2024 1230   NITRITE NEGATIVE  01/03/2024 1230   LEUKOCYTESUR NEGATIVE 01/03/2024 1230   Sepsis Labs: @LABRCNTIP (procalcitonin:4,lacticidven:4) ) Recent Results (from the past 240 hours)  Resp panel by RT-PCR (RSV, Flu A&B, Covid) Anterior Nasal Swab     Status: None   Collection Time: 01/03/24 12:49 PM   Specimen: Anterior Nasal Swab  Result Value Ref Range Status   SARS Coronavirus 2 by RT PCR NEGATIVE NEGATIVE Final   Influenza A by PCR NEGATIVE NEGATIVE Final   Influenza B by PCR NEGATIVE NEGATIVE Final    Comment: (NOTE) The Xpert Xpress SARS-CoV-2/FLU/RSV plus assay is intended as an aid in the diagnosis of influenza from Nasopharyngeal swab specimens and should not be used as a sole basis for treatment. Nasal washings and aspirates are unacceptable for Xpert Xpress SARS-CoV-2/FLU/RSV testing.  Fact Sheet for Patients: BloggerCourse.com  Fact Sheet for Healthcare Providers: SeriousBroker.it  This test is not yet approved or cleared by the [place] FDA and has been authorized for detection and/or diagnosis of SARS-CoV-2 by FDA under an Emergency Use Authorization (  EUA). This EUA will remain in effect (meaning this test can be used) for the duration of the COVID-19 declaration under Section 564(b)(1) of the Act, 21 U.S.C. section 360bbb-3(b)(1), unless the authorization is terminated or revoked.     Resp Syncytial Virus by PCR NEGATIVE NEGATIVE Final    Comment: (NOTE) Fact Sheet for Patients: BloggerCourse.com  Fact Sheet for Healthcare Providers: SeriousBroker.it  This test is not yet approved or cleared by the [place] FDA and has been authorized for detection and/or diagnosis of SARS-CoV-2 by FDA under an Emergency Use Authorization (EUA). This EUA will remain in effect (meaning this test can be used) for the duration of the COVID-19 declaration under Section 564(b)(1) of the Act, 21  U.S.C. section 360bbb-3(b)(1), unless the authorization is terminated or revoked.  Performed at [facility], [address], [place], [place] [address]      Radiological Exams on Admission: MR Brain W and Wo Contrast Result Date: 01/03/2024 CLINICAL DATA:  Acute neurologic deficit.  Dizziness for 2 days. EXAM: MRI HEAD WITHOUT AND WITH CONTRAST TECHNIQUE: Multiplanar, multiecho pulse sequences of the brain and surrounding structures were obtained without and with intravenous contrast. CONTRAST:  7mL GADAVIST GADOBUTROL 1 MMOL/ML IV SOLN COMPARISON:  None Available. FINDINGS: Brain: There are multiple punctate foci of abnormal diffusion restriction within the right cerebellar hemisphere and right occipital lobe. No acute or chronic hemorrhage. Normal white matter signal, parenchymal volume and CSF spaces. The midline structures are normal. There is no abnormal contrast enhancement. Vascular: Normal flow voids. Skull and upper cervical spine: Normal calvarium and skull base. Visualized upper cervical spine and soft tissues are normal. Sinuses/Orbits:No paranasal sinus fluid levels or advanced mucosal thickening. No mastoid or middle ear effusion. Normal orbits. IMPRESSION: Multiple punctate acute infarcts within the right cerebellar hemisphere and right occipital lobe. No hemorrhage or mass effect. Electronically Signed   By: Deatra Robinson M.D.   On: 01/03/2024 22:33   DG Chest Port 1 View Result Date: 01/03/2024 CLINICAL DATA:  Sepsis. EXAM: PORTABLE CHEST 1 VIEW COMPARISON:  11/21/2021 FINDINGS: The heart size and mediastinal contours are within normal limits. Low lung volumes again noted. Both lungs are clear. The visualized skeletal structures are unremarkable. IMPRESSION: Low lung volumes. No active disease. Electronically Signed   By: Danae Orleans M.D.   On: 01/03/2024 13:28    EKG: Independently reviewed. Sinus rhythm.   Assessment/Plan   1. Ischemic CVA  - Neurology consultation  appreciated  - Continue cardiac monitoring and frequent neuro checks, check A1c, lipids, CTA head and neck, and echocardiogram, consult PT/OT/SLP, load with 300 mg Plavix and continue Plavix 75 mg and ASA 81 mg daily   2. Lactic acidosis  - Pt is well-appearing with warm extremities and stable BP  - Trend lactate, follow cultures and clinical course    DVT prophylaxis: Lovenox  Code Status: Full  Level of Care: Level of care: Telemetry Medical Family Communication: Daughter at bedside  Disposition Plan:  Patient is from: Home  Anticipated d/c is to: Home  Anticipated d/c date is: 2/23 or 01/05/24  Patient currently: Pending CVA workup  Consults called: Neurology  Admission status: Observation     Briscoe Deutscher, MD [facility]  01/04/2024, 12:42 AM

## 2024-01-04 NOTE — Plan of Care (Signed)
  Problem: Education: Goal: Knowledge of disease or condition will improve Outcome: Progressing   Problem: Activity: Goal: Risk for activity intolerance will decrease Outcome: Progressing   Problem: Pain Managment: Goal: General experience of comfort will improve and/or be controlled Outcome: Progressing   Problem: Safety: Goal: Ability to remain free from injury will improve Outcome: Progressing   Problem: Skin Integrity: Goal: Risk for impaired skin integrity will decrease Outcome: Progressing

## 2024-01-04 NOTE — Progress Notes (Signed)
  Echocardiogram 2D Echocardiogram has been performed.  Melvin Mills 01/04/2024, 3:47 PM

## 2024-01-04 NOTE — Progress Notes (Signed)
 PROGRESS NOTE    Melvin Mills  ZOX:[id number] DOB: 25-Nov-1970 DOA: 01/03/2024 PCP: Grayce Sessions, NP   Brief Narrative:  Patient is a 53 year old [name] with a past medical history significant for but not limited to COVID-19 who does not regularly go to a physician did not take any medications who presented with dizziness, nausea or vomiting.  He reported that if he developed a room spinning sensation with nausea and nonbloody vomiting and a mild headache 2 days ago which has persisted.  Symptoms are better at rest in a supine position and worse when he is up with activity and upright.  He was feeling generally weak earlier but did not have any focal weakness and denied any numbness or tingling.  Upon arrival to the ED he is found to be afebrile and he was saturating well on room air and labs were elevated for leukocytosis and he had a lactic acidosis of 3.9.  Further workup was initiated and he underwent a brain MRI which revealed multiple punctate acute infarcts within the right cerebellar hemisphere and right occipital lobe and so neurology was consulted and recommended continuing stroke workup and he was given IV fluids and meclizine and Valium.  Currently echocardiogram is still pending along with PT and OT evaluations.  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 Short segment occlusion at the origin of the right AICA." -MRI Brain done and showed "Multiple punctate acute infarcts within the right cerebellar hemisphere and right occipital lobe. No hemorrhage or mass effect." -Check ECHOCardiogram and this is pending -Lipid Panel as below; HbA1c was 5.6 -Continue with normal saline at 100 mL/h for 20  hours and then also continue meclizine 25 mg p.o. 3 times daily as needed for dizziness and then diazepam 2 mg p.o. every 12 as needed for refractory Vertigo -Continue Neurochecks per protocol and continue to Monitor on Telemetry -PT/OT/SLP to further Evaluate and Treat -Neurology has placed the patient on prophylactic therapy with dual antiplatelet therapy with aspirin 81 mg p.o. daily and clopidogrel 75 mg p.o. daily after 300 mg load -Stroke team to follow  Lactic Acidosis -Lactic Acid Level Trend: Recent Labs  Lab 01/03/24 1253 01/03/24 1601 01/04/24 0419  LATICACIDVEN 3.9* 2.0* 1.0  -Improved and IV fluid hydration is being initiated and continued; patient is status post 1 L of lactated ringer bolus and 1 L of normal saline bolus and is now on maintenance IV fluids at 100 mL/h with normal saline  Renal Insufficiency -BUN/Cr Trend: Recent Labs  Lab 01/03/24 1242 01/04/24 0419  BUN 15 15  CREATININE 1.34* 1.22  -Continue with IV fluid hydration as above -Avoid Nephrotoxic Medications, Contrast Dyes, Hypotension and Dehydration to Ensure Adequate Renal Perfusion and will need to Renally Adjust Meds -Continue to Monitor and Trend Renal Function carefully and repeat CMP in the AM   Hyperglycemia -Likely reactive -HbA1c was 5.6 -Glucose Trend: Recent Labs  Lab 01/03/24 1242 01/04/24 0419  GLUCOSE 154* 95   Leukocytosis -Resolved. WBC Trend: Recent Labs  Lab 01/03/24 1242 01/04/24 0419  WBC 11.3* 9.0  -Continue to Monitor for S/Sx of Infection; Repeat CBC in the AM  HLD -Patient's lipid panel done and showed a total cholesterol/HDL ratio of 5.9, cholesterol 212, HDL of  36, LDL 154, triglycerides of 161, VLDL 22   DVT prophylaxis: enoxaparin (LOVENOX) injection 40 mg Start: 01/04/24 1000    Code Status: Full Code Family Communication: Discussed with family at bedside  Disposition Plan:  Level of care: Telemetry Medical Status is: Observation The patient will  require care spanning > 2 midnights and should be moved to inpatient because: Needs further clinical workup and evaluation by PT and OT for safe discharge disposition   Consultants:  Neurology  Procedures:  As delineated as above ECHOCARDIOGRAM  Antimicrobials:  Anti-infectives (From admission, onward)    Start     Dose/Rate Route Frequency Ordered Stop   01/03/24 1315  ceFEPIme (MAXIPIME) 2 g in sodium chloride 0.9 % 100 mL IVPB  Status:  Discontinued        2 g 200 mL/hr over 30 Minutes Intravenous  Once 01/03/24 1311 01/03/24 1351   01/03/24 1315  metroNIDAZOLE (FLAGYL) IVPB 500 mg  Status:  Discontinued        500 mg 100 mL/hr over 60 Minutes Intravenous  Once 01/03/24 1311 01/03/24 1351   01/03/24 1315  vancomycin (VANCOCIN) IVPB 1000 mg/200 mL premix  Status:  Discontinued        1,000 mg 200 mL/hr over 60 Minutes Intravenous  Once 01/03/24 1311 01/03/24 1351       Subjective: Seen and examined at bedside and he is still feeling dizzy and lightheaded.  Also, have a headache and states that without medication it happens every 2 hours.  Daughter states that he had some nausea vomiting earlier.  Unfortunately was not able to get the video translator and waited almost 20 minutes so I used the patient's daughter to help interpret and finally the video translator came on at the end of the encounter and there was Solomon [phone].  Objective: Vitals:   01/04/24 0500 01/04/24 0638 01/04/24 0805 01/04/24 1122  BP: (!) 136/59  (!) 142/75 (!) 145/66  Pulse: (!) 56  62 67  Resp: 18  (!) 24 19  Temp:  98.2 F (36.8 C) 98.3 F (36.8 C) 98.4 F (36.9 C)  TempSrc:  Oral Oral Oral  SpO2: 100%  100% 100%    Intake/Output Summary (Last 24 hours) at 01/04/2024 1302 Last data filed at 01/03/2024 2108 Gross per 24 hour  Intake 1000 ml  Output --  Net 1000 ml   There were no vitals filed for this visit.  Examination: Physical Exam:  Constitutional: Thin [name] Caucasian male in no  acute distress Respiratory: Diminished to auscultation bilaterally, no wheezing, rales, rhonchi or crackles. Normal respiratory effort and patient is not tachypenic. No accessory muscle use.  Unlabored breathing Cardiovascular: RRR, no murmurs / rubs / gallops. S1 and S2 auscultated. No extremity edema.   Abdomen: Soft, non-tender, non-distended. Bowel sounds positive.  GU: Deferred. Musculoskeletal: No clubbing / cyanosis of digits/nails. No joint deformity upper and lower extremities. Skin: No rashes, lesions, ulcers or limited skin evaluation. No induration; Warm and dry.  Neurologic: CN 2-12 grossly intact with no focal deficits. Romberg sign and cerebellar reflexes not assessed.  Psychiatric: Is awake and alert  Data Reviewed: I have personally reviewed following labs and imaging studies  CBC: Recent Labs  Lab 01/03/24 1242 01/04/24 0419  WBC 11.3* 9.0  NEUTROABS 9.0*  --   HGB 16.1 14.9  HCT 49.8 46.2  MCV 84.0 83.7  PLT 151 158   Basic Metabolic Panel: Recent Labs  Lab 01/03/24 1242 01/04/24 0419  NA 136 137  K 4.7 3.5  CL 103 106  CO2 22 22  GLUCOSE 154* 95  BUN 15 15  CREATININE 1.34* 1.22  CALCIUM 9.3 8.7*   GFR: CrCl cannot be calculated (Unknown ideal weight.). Liver Function Tests: Recent Labs  Lab 01/03/24 1242  AST 31  ALT 23  ALKPHOS 69  BILITOT 0.6  PROT 7.8  ALBUMIN 4.0   No results for input(s): "LIPASE", "AMYLASE" in the last 168 hours. No results for input(s): "AMMONIA" in the last 168 hours. Coagulation Profile: Recent Labs  Lab 01/03/24 1242  INR 1.0   Cardiac Enzymes: No results for input(s): "CKTOTAL", "CKMB", "CKMBINDEX", "TROPONINI" in the last 168 hours. BNP (last 3 results) No results for input(s): "PROBNP" in the last 8760 hours. HbA1C: Recent Labs    01/04/24 0419  HGBA1C 5.6   CBG: Recent Labs  Lab 01/03/24 1244  GLUCAP 163*   Lipid Profile: Recent Labs    01/04/24 0419  CHOL 212*  HDL 36*  LDLCALC 154*   TRIG 108  CHOLHDL 5.9   Thyroid Function Tests: No results for input(s): "TSH", "T4TOTAL", "FREET4", "T3FREE", "THYROIDAB" in the last 72 hours. Anemia Panel: No results for input(s): "VITAMINB12", "FOLATE", "FERRITIN", "TIBC", "IRON", "RETICCTPCT" in the last 72 hours. Sepsis Labs: Recent Labs  Lab 01/03/24 1253 01/03/24 1601 01/04/24 0419  LATICACIDVEN 3.9* 2.0* 1.0   Recent Results (from the past 240 hours)  Blood Culture (routine x 2)     Status: None (Preliminary result)   Collection Time: 01/03/24 12:30 PM   Specimen: BLOOD  Result Value Ref Range Status   Specimen Description BLOOD RIGHT ANTECUBITAL  Final   Special Requests   Final    BOTTLES DRAWN AEROBIC AND ANAEROBIC Blood Culture results may not be optimal due to an inadequate volume of blood received in culture bottles   Culture   Final    NO GROWTH < 24 HOURS Performed at [facility], [address] [place], [place] [address]    Report Status PENDING  Incomplete  Blood Culture (routine x 2)     Status: None (Preliminary result)   Collection Time: 01/03/24 12:35 PM   Specimen: BLOOD RIGHT HAND  Result Value Ref Range Status   Specimen Description BLOOD RIGHT HAND  Final   Special Requests   Final    BOTTLES DRAWN AEROBIC AND ANAEROBIC Blood Culture results may not be optimal due to an inadequate volume of blood received in culture bottles   Culture   Final    NO GROWTH < 24 HOURS Performed at [facility], [address] [place], [place] [address]    Report Status PENDING  Incomplete  Resp panel by RT-PCR (RSV, Flu A&B, Covid) Anterior Nasal Swab     Status: None   Collection Time: 01/03/24 12:49 PM   Specimen: Anterior Nasal Swab  Result Value Ref Range Status   SARS Coronavirus 2 by RT PCR NEGATIVE NEGATIVE Final   Influenza A by PCR NEGATIVE NEGATIVE Final   Influenza B by PCR NEGATIVE NEGATIVE Final    Comment: (NOTE) The Xpert Xpress SARS-CoV-2/FLU/RSV plus assay is intended as an  aid in the diagnosis of influenza from Nasopharyngeal swab specimens and should not be used as a sole basis for treatment. Nasal washings and aspirates are unacceptable for Xpert Xpress SARS-CoV-2/FLU/RSV testing.  Fact Sheet for Patients: BloggerCourse.com  Fact Sheet for Healthcare Providers: SeriousBroker.it  This test is not yet approved or cleared by the [place] FDA  and has been authorized for detection and/or diagnosis of SARS-CoV-2 by FDA under an Emergency Use Authorization (EUA). This EUA will remain in effect (meaning this test can be used) for the duration of the COVID-19 declaration under Section 564(b)(1) of the Act, 21 U.S.C. section 360bbb-3(b)(1), unless the authorization is terminated or revoked.     Resp Syncytial Virus by PCR NEGATIVE NEGATIVE Final    Comment: (NOTE) Fact Sheet for Patients: BloggerCourse.com  Fact Sheet for Healthcare Providers: SeriousBroker.it  This test is not yet approved or cleared by the [place] FDA and has been authorized for detection and/or diagnosis of SARS-CoV-2 by FDA under an Emergency Use Authorization (EUA). This EUA will remain in effect (meaning this test can be used) for the duration of the COVID-19 declaration under Section 564(b)(1) of the Act, 21 U.S.C. section 360bbb-3(b)(1), unless the authorization is terminated or revoked.  Performed at [facility], [address], [place], [place] [address]     Radiology Studies: CT ANGIO HEAD NECK W WO CM Result Date: 01/04/2024 CLINICAL DATA:  Stroke/TIA EXAM: CT ANGIOGRAPHY HEAD AND NECK WITH AND WITHOUT CONTRAST TECHNIQUE: Multidetector CT imaging of the head and neck was performed using the standard protocol during bolus administration of intravenous contrast. Multiplanar CT image reconstructions and MIPs were obtained to evaluate the vascular anatomy.  Carotid stenosis measurements (when applicable) are obtained utilizing NASCET criteria, using the distal internal carotid diameter as the denominator. RADIATION DOSE REDUCTION: This exam was performed according to the departmental dose-optimization program which includes automated exposure control, adjustment of the mA and/or kV according to patient size and/or use of iterative reconstruction technique. CONTRAST:  75mL OMNIPAQUE IOHEXOL 350 MG/ML SOLN COMPARISON:  None Available. FINDINGS: CT HEAD FINDINGS Brain: No mass,hemorrhage or extra-axial collection. Normal appearance of the parenchyma and CSF spaces. Vascular: No hyperdense vessel or unexpected vascular calcification. Skull: The visualized skull base, calvarium and extracranial soft tissues are normal. Sinuses/Orbits: No fluid levels or advanced mucosal thickening of the visualized paranasal sinuses. No mastoid or middle ear effusion. Normal orbits. CTA NECK FINDINGS Skeleton: No acute abnormality or high grade bony spinal canal stenosis. Other neck: Normal pharynx, larynx and major salivary glands. No cervical lymphadenopathy. Unremarkable thyroid gland. Upper chest: No pneumothorax or pleural effusion. No nodules or masses. Aortic arch: There is no calcific atherosclerosis of the aortic arch. Conventional 3 vessel aortic branching pattern. RIGHT carotid system: Normal without aneurysm, dissection or stenosis. LEFT carotid system: Normal without aneurysm, dissection or stenosis. Vertebral arteries: Right dominant configuration. There is no dissection, occlusion or flow-limiting stenosis to the skull base (V1-V3 segments). CTA HEAD FINDINGS POSTERIOR CIRCULATION: The left vertebral artery terminates in PICA. There is severe stenosis of the right vertebral artery at the vertebrobasilar junction. Short segment occlusion at the origin of the right AICA. Basilar artery is normal. Superior cerebellar arteries are normal. Posterior cerebral arteries are normal.  ANTERIOR CIRCULATION: Intracranial internal carotid arteries are normal. Anterior cerebral arteries are normal. 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 Mild stenosis of the right MCA M1 segment. Venous sinuses: As permitted by contrast timing, patent. Anatomic variants: Fetal origin of the left posterior cerebral artery. Review of the MIP images confirms the above findings. IMPRESSION: 1. 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 2. 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 3. Short segment occlusion at the origin of the right AICA. Electronically Signed   By: Deatra Robinson M.D.   On: 01/04/2024  01:46   MR Brain W and Wo Contrast Result Date: 01/03/2024 CLINICAL DATA:  Acute neurologic deficit.  Dizziness for 2 days. EXAM: MRI HEAD WITHOUT AND WITH CONTRAST TECHNIQUE: Multiplanar, multiecho pulse sequences of the brain and surrounding structures were obtained without and with intravenous contrast. CONTRAST:  7mL GADAVIST GADOBUTROL 1 MMOL/ML IV SOLN COMPARISON:  None Available. FINDINGS: Brain: There are multiple punctate foci of abnormal diffusion restriction within the right cerebellar hemisphere and right occipital lobe. No acute or chronic hemorrhage. Normal white matter signal, parenchymal volume and CSF spaces. The midline structures are normal. There is no abnormal contrast enhancement. Vascular: Normal flow voids. Skull and upper cervical spine: Normal calvarium and skull base. Visualized upper cervical spine and soft tissues are normal. Sinuses/Orbits:No paranasal sinus fluid levels or advanced mucosal thickening. No mastoid or middle ear effusion. Normal orbits. IMPRESSION: Multiple punctate acute infarcts within the right cerebellar hemisphere and right occipital lobe. No hemorrhage or mass effect. Electronically Signed   By: Deatra Robinson M.D.   On: 01/03/2024 22:33   DG Chest Port 1 View Result Date: 01/03/2024 CLINICAL DATA:  Sepsis. EXAM: PORTABLE CHEST  1 VIEW COMPARISON:  11/21/2021 FINDINGS: The heart size and mediastinal contours are within normal limits. Low lung volumes again noted. Both lungs are clear. The visualized skeletal structures are unremarkable. IMPRESSION: Low lung volumes. No active disease. Electronically Signed   By: Danae Orleans M.D.   On: 01/03/2024 13:28   Scheduled Meds:  [START ON 01/05/2024]  stroke: early stages of recovery book   Does not apply Once   aspirin EC  81 mg Oral Daily   atorvastatin  80 mg Oral QHS   [START ON 01/05/2024] clopidogrel  75 mg Oral Daily   enoxaparin (LOVENOX) injection  40 mg Subcutaneous Q24H   Continuous Infusions:  sodium chloride 100 mL/hr at 01/04/24 4098    LOS: 0 days   Marguerita Merles, DO [facility] Available via Epic secure chat 7am-7pm After these hours, please refer to coverage provider listed on [email or web address] 01/04/2024, 1:02 PM

## 2024-01-04 NOTE — Progress Notes (Signed)
 Pt arrived to 3w01 via stretcher from the ED. See assessment.

## 2024-01-04 NOTE — Consult Note (Signed)
 NEUROLOGY CONSULT NOTE   Date of service: January 04, 2024 Patient Name: Melvin Mills MRN:  [id number] DOB:  06-19-1971 Chief Complaint: "Dizziness" Requesting Provider: Briscoe Deutscher, MD  History of Present Illness  Melvin Mills is a 53 y.o. male who was in his normal state of health until a couple of days ago at which point he started having intermittent dizziness.  Subsequently it came on while he was driving and he had to pull over and ask for help.  He was brought in to the emergency department where an MRI was obtained showing multifocal posterior circulation strokes.  History is obtained with the help of his family member who is helping to translate.  He denies any trouble with his vision, double vision, but does state that things are "jumping around" a little bit with his vertigo.   LKW: 2 days ago Modified rankin score: 0-Completely asymptomatic and back to baseline post- stroke IV Thrombolysis: No, outside of window EVT: No, no signs of LVO ICH Score:  NIHSS components Score: Comment  1a Level of Conscious 0[x]  1[]  2[]  3[]      1b LOC Questions 0[x]  1[]  2[]       1c LOC Commands 0[x]  1[]  2[]       2 Best Gaze 0[x]  1[]  2[]       3 Visual 0[x]  1[]  2[]  3[]      4 Facial Palsy 0[x]  1[]  2[]  3[]      5a Motor Arm - left 0[x]  1[]  2[]  3[]  4[]  UN[]    5b Motor Arm - Right 0[x]  1[]  2[]  3[]  4[]  UN[]    6a Motor Leg - Left 0[x]  1[]  2[]  3[]  4[]  UN[]    6b Motor Leg - Right 0[x]  1[]  2[]  3[]  4[]  UN[]    7 Limb Ataxia 0[x]  1[]  2[]  3[]  UN[]     8 Sensory 0[x]  1[]  2[]  UN[]      9 Best Language 0[x]  1[]  2[]  3[]      10 Dysarthria 0[x]  1[]  2[]  UN[]      11 Extinct. and Inattention 0[x]  1[]  2[]       TOTAL: 0      Past History   Past Medical History:  Diagnosis Date   COVID-19 virus infection 04/08/2019    History reviewed. No pertinent surgical history.  Family History: History reviewed. No pertinent family history.  Social History  reports that he has never smoked. He has never  used smokeless tobacco. He reports that he does not drink alcohol and does not use drugs.  No Known Allergies  Medications   Current Facility-Administered Medications:    [START ON 01/05/2024]  stroke: early stages of recovery book, , Does not apply, Once, Opyd, Lavone Neri, MD   0.9 %  sodium chloride infusion, , Intravenous, Continuous, Opyd, Lavone Neri, MD, Last Rate: 100 mL/hr at 01/04/24 0143, New Bag at 01/04/24 0143   acetaminophen (TYLENOL) tablet 650 mg, 650 mg, Oral, Q4H PRN **OR** acetaminophen (TYLENOL) 160 MG/5ML solution 650 mg, 650 mg, Per Tube, Q4H PRN **OR** acetaminophen (TYLENOL) suppository 650 mg, 650 mg, Rectal, Q4H PRN, Opyd, Timothy S, MD   diazepam (VALIUM) tablet 2 mg, 2 mg, Oral, Q12H PRN, Opyd, Lavone Neri, MD   enoxaparin (LOVENOX) injection 40 mg, 40 mg, Subcutaneous, Q24H, Opyd, Lavone Neri, MD   meclizine (ANTIVERT) tablet 25 mg, 25 mg, Oral, TID PRN, Opyd, Lavone Neri, MD, 25 mg at 01/04/24 0142   ondansetron (ZOFRAN) injection 4 mg, 4 mg, Intravenous, Q6H PRN, Opyd, Lavone Neri, MD   senna-docusate (Senokot-S) tablet 1 tablet, 1  tablet, Oral, QHS PRN, Opyd, Lavone Neri, MD No current outpatient medications on file.  Vitals   Vitals:   2024/01/23 2115 01/23/24 2229 01/04/24 0145 01/04/24 0200  BP: 135/77 (!) 165/95 (!) 142/57 (!) 124/59  Pulse: 60 (!) 55 (!) 55 67  Resp: 18 20 (!) 21 16  Temp:  99 F (37.2 C) 97.9 F (36.6 C)   TempSrc:  Oral Oral   SpO2: 100% 100% 100% 98%    There is no height or weight on file to calculate BMI.  Physical Exam   Constitutional: Appears well-developed and well-nourished.  Psych: Affect appropriate to situation.  Eyes: No scleral injection.  HENT: No OP obstruction.  Head: Normocephalic.  Cardiovascular: Normal rate and regular rhythm.  Respiratory: Effort normal, non-labored breathing.  GI: Soft.  No distension. There is no tenderness.  Skin: WDI.   Neurologic Examination    Neuro: Mental Status: Patient is awake,  alert, oriented to person, place, month, year, and situation. Patient is able to give a clear and coherent history. No signs of aphasia or neglect Cranial Nerves: II: Visual Fields are full. Pupils are equal, round, and reactive to light.   III,IV, VI: EOMI without ptosis or diploplia.  V: Facial sensation is symmetric to temperature VII: Facial movement is symmetric.  VIII: hearing is intact to voice X: Uvula elevates symmetrically XII: tongue is midline without atrophy or fasciculations.  Motor: Tone is normal. Bulk is normal. 5/5 strength was present in all four extremities.  Sensory: Sensation is symmetric to light touch and temperature in the arms and legs. Deep Tendon Reflexes: 2+ and symmetric in the biceps and patellae.  Plantars: Toes are downgoing bilaterally.  Cerebellar: FNF and HKS are intact bilaterally        Labs/Imaging/Neurodiagnostic studies   CBC:  Recent Labs  Lab January 23, 2024 1242  WBC 11.3*  NEUTROABS 9.0*  HGB 16.1  HCT 49.8  MCV 84.0  PLT 151   Basic Metabolic Panel:  Lab Results  Component Value Date   NA 136 23-Jan-2024   K 4.7 01-23-24   CO2 22 2024/01/23   GLUCOSE 154 (H) January 23, 2024   BUN 15 2024-01-23   CREATININE 1.34 (H) Jan 23, 2024   CALCIUM 9.3 01/23/2024   GFRNONAA >60 01/23/24   GFRAA >60 04/29/2019   Lipid Panel: No results found for: "LDLCALC" HgbA1c:  Lab Results  Component Value Date   HGBA1C 6.2 (H) 04/15/2019   Urine Drug Screen:     Component Value Date/Time   LABOPIA NONE DETECTED 11/21/2021 0230   COCAINSCRNUR NONE DETECTED 11/21/2021 0230   LABBENZ NONE DETECTED 11/21/2021 0230   AMPHETMU NONE DETECTED 11/21/2021 0230   THCU NONE DETECTED 11/21/2021 0230   LABBARB NONE DETECTED 11/21/2021 0230    Alcohol Level     Component Value Date/Time   ETH <10 11/21/2021 0013   INR  Lab Results  Component Value Date   INR 1.0 01/23/24   APTT  Lab Results  Component Value Date   APTT 28 01-23-2024     MRI Brain(Personally reviewed): Multifocal posterior circulation strokes  ASSESSMENT   Melvin Mills is a 53 y.o. male with multifocal posterior circulation strokes.  My suspicion is that this is an embolus that broke up, though multiple emboli from artery artery embolization would also be possible.  He will need to be admitted for secondary risk factor evaluation and  RECOMMENDATIONS  - HgbA1c, fasting lipid panel - Frequent neuro checks - Echocardiogram - CTA head and neck -  Prophylactic therapy-Antiplatelet med: Aspirin - dose 81mg  and plavix 75mg  daily  after 300mg  load  - Risk factor modification - Telemetry monitoring - PT consult, OT consult, Speech consult - Stroke team to follow  ______________________________________________________________________    Signed, Ritta Slot, MD [facility]

## 2024-01-04 NOTE — H&P (View-Only) (Signed)
 NEUROLOGY CONSULT NOTE   Date of service: January 04, 2024 Patient Name: Melvin Mills MRN:  [id number] DOB:  06-19-1971 Chief Complaint: "Dizziness" Requesting Provider: Briscoe Deutscher, MD  History of Present Illness  Melvin Mills is a 53 y.o. male who was in his normal state of health until a couple of days ago at which point he started having intermittent dizziness.  Subsequently it came on while he was driving and he had to pull over and ask for help.  He was brought in to the emergency department where an MRI was obtained showing multifocal posterior circulation strokes.  History is obtained with the help of his family member who is helping to translate.  He denies any trouble with his vision, double vision, but does state that things are "jumping around" a little bit with his vertigo.   LKW: 2 days ago Modified rankin score: 0-Completely asymptomatic and back to baseline post- stroke IV Thrombolysis: No, outside of window EVT: No, no signs of LVO ICH Score:  NIHSS components Score: Comment  1a Level of Conscious 0[x]  1[]  2[]  3[]      1b LOC Questions 0[x]  1[]  2[]       1c LOC Commands 0[x]  1[]  2[]       2 Best Gaze 0[x]  1[]  2[]       3 Visual 0[x]  1[]  2[]  3[]      4 Facial Palsy 0[x]  1[]  2[]  3[]      5a Motor Arm - left 0[x]  1[]  2[]  3[]  4[]  UN[]    5b Motor Arm - Right 0[x]  1[]  2[]  3[]  4[]  UN[]    6a Motor Leg - Left 0[x]  1[]  2[]  3[]  4[]  UN[]    6b Motor Leg - Right 0[x]  1[]  2[]  3[]  4[]  UN[]    7 Limb Ataxia 0[x]  1[]  2[]  3[]  UN[]     8 Sensory 0[x]  1[]  2[]  UN[]      9 Best Language 0[x]  1[]  2[]  3[]      10 Dysarthria 0[x]  1[]  2[]  UN[]      11 Extinct. and Inattention 0[x]  1[]  2[]       TOTAL: 0      Past History   Past Medical History:  Diagnosis Date   COVID-19 virus infection 04/08/2019    History reviewed. No pertinent surgical history.  Family History: History reviewed. No pertinent family history.  Social History  reports that he has never smoked. He has never  used smokeless tobacco. He reports that he does not drink alcohol and does not use drugs.  No Known Allergies  Medications   Current Facility-Administered Medications:    [START ON 01/05/2024]  stroke: early stages of recovery book, , Does not apply, Once, Opyd, Lavone Neri, MD   0.9 %  sodium chloride infusion, , Intravenous, Continuous, Opyd, Lavone Neri, MD, Last Rate: 100 mL/hr at 01/04/24 0143, New Bag at 01/04/24 0143   acetaminophen (TYLENOL) tablet 650 mg, 650 mg, Oral, Q4H PRN **OR** acetaminophen (TYLENOL) 160 MG/5ML solution 650 mg, 650 mg, Per Tube, Q4H PRN **OR** acetaminophen (TYLENOL) suppository 650 mg, 650 mg, Rectal, Q4H PRN, Opyd, Timothy S, MD   diazepam (VALIUM) tablet 2 mg, 2 mg, Oral, Q12H PRN, Opyd, Lavone Neri, MD   enoxaparin (LOVENOX) injection 40 mg, 40 mg, Subcutaneous, Q24H, Opyd, Lavone Neri, MD   meclizine (ANTIVERT) tablet 25 mg, 25 mg, Oral, TID PRN, Opyd, Lavone Neri, MD, 25 mg at 01/04/24 0142   ondansetron (ZOFRAN) injection 4 mg, 4 mg, Intravenous, Q6H PRN, Opyd, Lavone Neri, MD   senna-docusate (Senokot-S) tablet 1 tablet, 1  tablet, Oral, QHS PRN, Opyd, Lavone Neri, MD No current outpatient medications on file.  Vitals   Vitals:   2024/01/23 2115 01/23/24 2229 01/04/24 0145 01/04/24 0200  BP: 135/77 (!) 165/95 (!) 142/57 (!) 124/59  Pulse: 60 (!) 55 (!) 55 67  Resp: 18 20 (!) 21 16  Temp:  99 F (37.2 C) 97.9 F (36.6 C)   TempSrc:  Oral Oral   SpO2: 100% 100% 100% 98%    There is no height or weight on file to calculate BMI.  Physical Exam   Constitutional: Appears well-developed and well-nourished.  Psych: Affect appropriate to situation.  Eyes: No scleral injection.  HENT: No OP obstruction.  Head: Normocephalic.  Cardiovascular: Normal rate and regular rhythm.  Respiratory: Effort normal, non-labored breathing.  GI: Soft.  No distension. There is no tenderness.  Skin: WDI.   Neurologic Examination    Neuro: Mental Status: Patient is awake,  alert, oriented to person, place, month, year, and situation. Patient is able to give a clear and coherent history. No signs of aphasia or neglect Cranial Nerves: II: Visual Fields are full. Pupils are equal, round, and reactive to light.   III,IV, VI: EOMI without ptosis or diploplia.  V: Facial sensation is symmetric to temperature VII: Facial movement is symmetric.  VIII: hearing is intact to voice X: Uvula elevates symmetrically XII: tongue is midline without atrophy or fasciculations.  Motor: Tone is normal. Bulk is normal. 5/5 strength was present in all four extremities.  Sensory: Sensation is symmetric to light touch and temperature in the arms and legs. Deep Tendon Reflexes: 2+ and symmetric in the biceps and patellae.  Plantars: Toes are downgoing bilaterally.  Cerebellar: FNF and HKS are intact bilaterally        Labs/Imaging/Neurodiagnostic studies   CBC:  Recent Labs  Lab January 23, 2024 1242  WBC 11.3*  NEUTROABS 9.0*  HGB 16.1  HCT 49.8  MCV 84.0  PLT 151   Basic Metabolic Panel:  Lab Results  Component Value Date   NA 136 23-Jan-2024   K 4.7 01-23-24   CO2 22 2024/01/23   GLUCOSE 154 (H) January 23, 2024   BUN 15 2024-01-23   CREATININE 1.34 (H) Jan 23, 2024   CALCIUM 9.3 01/23/2024   GFRNONAA >60 01/23/24   GFRAA >60 04/29/2019   Lipid Panel: No results found for: "LDLCALC" HgbA1c:  Lab Results  Component Value Date   HGBA1C 6.2 (H) 04/15/2019   Urine Drug Screen:     Component Value Date/Time   LABOPIA NONE DETECTED 11/21/2021 0230   COCAINSCRNUR NONE DETECTED 11/21/2021 0230   LABBENZ NONE DETECTED 11/21/2021 0230   AMPHETMU NONE DETECTED 11/21/2021 0230   THCU NONE DETECTED 11/21/2021 0230   LABBARB NONE DETECTED 11/21/2021 0230    Alcohol Level     Component Value Date/Time   ETH <10 11/21/2021 0013   INR  Lab Results  Component Value Date   INR 1.0 01/23/24   APTT  Lab Results  Component Value Date   APTT 28 01-23-2024     MRI Brain(Personally reviewed): Multifocal posterior circulation strokes  ASSESSMENT   Melvin Sweitzer is a 53 y.o. male with multifocal posterior circulation strokes.  My suspicion is that this is an embolus that broke up, though multiple emboli from artery artery embolization would also be possible.  He will need to be admitted for secondary risk factor evaluation and  RECOMMENDATIONS  - HgbA1c, fasting lipid panel - Frequent neuro checks - Echocardiogram - CTA head and neck -  Prophylactic therapy-Antiplatelet med: Aspirin - dose 81mg  and plavix 75mg  daily  after 300mg  load  - Risk factor modification - Telemetry monitoring - PT consult, OT consult, Speech consult - Stroke team to follow  ______________________________________________________________________    Signed, Ritta Slot, MD [facility]

## 2024-01-04 NOTE — ED Notes (Signed)
 Verified with ccmd that pt is on the monitor

## 2024-01-04 NOTE — Hospital Course (Addendum)
 Patient is a 53 year old [name] speaking male with no real past medical history who presented with dizziness, nausea or vomiting.  He described it as room spinning and had a mild headache which persisted so it prompted him to get further evaluation.  Workup was initiated and he underwent a brain MRI which revealed multiple punctate acute infarcts within the right cerebellar hemisphere and right occipital lobe. Neurology was consulted and recommended stroke workup as the suspected embolic etiology. He was given IV fluids, meclizine and Valium and has completed the majority of the stroke workup but they are recommending TEE and possible loop implantation with EP and he is undergoing a hypercoagulable workup.  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 Short segment occlusion at the origin of the right AICA." -MRI Brain done and showed "Multiple punctate acute infarcts within the right cerebellar hemisphere and right occipital lobe. No hemorrhage or mass effect." -Check ECHOCardiogram and showed LVEF of 60-65%, no RWMA  and normal LVDP with RVSF being normal -Lipid Panel as below; HbA1c was 5.6 -IVF now stopped; Continuing Meclizine 25 mg p.o. 3 times daily as needed for dizziness and then diazepam 2 mg p.o. every 12 as needed for refractory Vertigo -C/w Neurochecks per protocol and continue to Monitor on Telemetry -PT/OT/SLP to further Evaluate and Treat -Neurology has placed the patient on prophylactic therapy with dual antiplatelet therapy with aspirin 81 mg p.o. daily and clopidogrel 75 mg p.o. daily after 300 mg load -Stroke team to follow and recommending Hypercoagulable w/u hypercoagulable panel has been  ordered as well as a LE Duplex which showed No DVT bilaterally -EP to be consulted and for ? Loop pending TEE findings in the AM  Lactic Acidosis -Lactic Acid Level Trend went from 3.9 -> 2.0 -> 1.0 -Improved and s/p IVF Hydration  History of Atrial Fibrillation -Had a solitary episode of A-fib in 2020 with COVID illness with no recurrence but presents with a so is not undergoing TEE in the a.m.  Renal Insufficiency, improving -BUN/Cr is now 14/1.24 -Avoid Nephrotoxic Medications, Contrast Dyes, Hypotension and Dehydration to Ensure Adequate Renal Perfusion and will need to Renally Adjust Meds -Continue to Monitor and Trend Renal Function carefully and repeat BMP in the AM   Hyperglycemia -Likely reactive as HbA1c was 5.6 and Glucose Trend ranging from 91-154 on daily CMP  Leukocytosis, resolved.  -WBC Trend is now 6.8 -Continue to Monitor for S/Sx of Infection; Repeat CBC in AM if necessary  HLD -Patient's lipid panel done and showed a total cholesterol/HDL ratio of 5.9, cholesterol 212, HDL of 36, LDL 154, triglycerides of 409, VLDL 22 -C/w Atorvastatin 80 mg po Daily given that his LDL goal is <70  Hypoalbuminemia -Patient's Albumin Trend ranging from 3.2-4.0; Will not repeat given

## 2024-01-05 ENCOUNTER — Observation Stay (HOSPITAL_BASED_OUTPATIENT_CLINIC_OR_DEPARTMENT_OTHER): Payer: BC Managed Care – PPO

## 2024-01-05 DIAGNOSIS — I639 Cerebral infarction, unspecified: Secondary | ICD-10-CM

## 2024-01-05 DIAGNOSIS — R519 Headache, unspecified: Secondary | ICD-10-CM | POA: Insufficient documentation

## 2024-01-05 DIAGNOSIS — E8809 Other disorders of plasma-protein metabolism, not elsewhere classified: Secondary | ICD-10-CM | POA: Insufficient documentation

## 2024-01-05 DIAGNOSIS — E785 Hyperlipidemia, unspecified: Secondary | ICD-10-CM

## 2024-01-05 DIAGNOSIS — N289 Disorder of kidney and ureter, unspecified: Secondary | ICD-10-CM | POA: Diagnosis not present

## 2024-01-05 DIAGNOSIS — D72829 Elevated white blood cell count, unspecified: Secondary | ICD-10-CM

## 2024-01-05 DIAGNOSIS — R739 Hyperglycemia, unspecified: Secondary | ICD-10-CM | POA: Insufficient documentation

## 2024-01-05 DIAGNOSIS — Z8679 Personal history of other diseases of the circulatory system: Secondary | ICD-10-CM

## 2024-01-05 DIAGNOSIS — R42 Dizziness and giddiness: Secondary | ICD-10-CM

## 2024-01-05 DIAGNOSIS — E872 Acidosis, unspecified: Secondary | ICD-10-CM | POA: Diagnosis not present

## 2024-01-05 HISTORY — DX: Headache, unspecified: R51.9

## 2024-01-05 HISTORY — DX: Other disorders of plasma-protein metabolism, not elsewhere classified: E88.09

## 2024-01-05 HISTORY — DX: Elevated white blood cell count, unspecified: D72.829

## 2024-01-05 HISTORY — DX: Hyperlipidemia, unspecified: E78.5

## 2024-01-05 HISTORY — DX: Disorder of kidney and ureter, unspecified: N28.9

## 2024-01-05 HISTORY — DX: Personal history of other diseases of the circulatory system: Z86.79

## 2024-01-05 HISTORY — DX: Hyperglycemia, unspecified: R73.9

## 2024-01-05 HISTORY — DX: Dizziness and giddiness: R42

## 2024-01-05 LAB — CBC WITH DIFFERENTIAL/PLATELET
Abs Immature Granulocytes: 0.01 10*3/uL (ref 0.00–0.07)
Basophils Absolute: 0 10*3/uL (ref 0.0–0.1)
Basophils Relative: 1 %
Eosinophils Absolute: 1.4 10*3/uL — ABNORMAL HIGH (ref 0.0–0.5)
Eosinophils Relative: 20 %
HCT: 44.6 % (ref 39.0–52.0)
Hemoglobin: 14.8 g/dL (ref 13.0–17.0)
Immature Granulocytes: 0 %
Lymphocytes Relative: 26 %
Lymphs Abs: 1.8 10*3/uL (ref 0.7–4.0)
MCH: 27.8 pg (ref 26.0–34.0)
MCHC: 33.2 g/dL (ref 30.0–36.0)
MCV: 83.7 fL (ref 80.0–100.0)
Monocytes Absolute: 0.4 10*3/uL (ref 0.1–1.0)
Monocytes Relative: 6 %
Neutro Abs: 3.2 10*3/uL (ref 1.7–7.7)
Neutrophils Relative %: 47 %
Platelets: 153 10*3/uL (ref 150–400)
RBC: 5.33 MIL/uL (ref 4.22–5.81)
RDW: 13.8 % (ref 11.5–15.5)
WBC: 6.8 10*3/uL (ref 4.0–10.5)
nRBC: 0 % (ref 0.0–0.2)

## 2024-01-05 LAB — COMPREHENSIVE METABOLIC PANEL
ALT: 17 U/L (ref 0–44)
AST: 21 U/L (ref 15–41)
Albumin: 3.2 g/dL — ABNORMAL LOW (ref 3.5–5.0)
Alkaline Phosphatase: 53 U/L (ref 38–126)
Anion gap: 7 (ref 5–15)
BUN: 14 mg/dL (ref 6–20)
CO2: 23 mmol/L (ref 22–32)
Calcium: 8.9 mg/dL (ref 8.9–10.3)
Chloride: 108 mmol/L (ref 98–111)
Creatinine, Ser: 1.24 mg/dL (ref 0.61–1.24)
GFR, Estimated: >60 mL/min (ref >60)
Glucose, Bld: 91 mg/dL (ref 70–99)
Potassium: 3.7 mmol/L (ref 3.5–5.1)
Sodium: 138 mmol/L (ref 135–145)
Total Bilirubin: 1 mg/dL (ref 0.0–1.2)
Total Protein: 6.6 g/dL (ref 6.5–8.1)

## 2024-01-05 LAB — PHOSPHORUS: Phosphorus: 2.5 mg/dL (ref 2.5–4.6)

## 2024-01-05 LAB — ANTITHROMBIN III: AntiThromb III Func: 108 % (ref 75–120)

## 2024-01-05 LAB — MAGNESIUM: Magnesium: 1.8 mg/dL (ref 1.7–2.4)

## 2024-01-05 LAB — SEDIMENTATION RATE: Sed Rate: 5 mm/h (ref 0–16)

## 2024-01-05 NOTE — Discharge Summary (Incomplete)
 Physician Discharge Summary   Patient: Melvin Mills MRN: [id number] DOB: 16-Nov-1970  Admit date:     01/03/2024  Discharge date: 01/05/24  Discharge Physician: Marguerita Merles, DO   PCP: Grayce Sessions, NP   Recommendations at discharge:    ***  Discharge Diagnoses: Principal Problem:   Ischemic stroke [facility]) Active Problems:   Lactic acidosis  Resolved Problems:   * No resolved hospital problems. [facility] Course: Patient is a 53 year old Melvin Mills speaking male with a past medical history significant for but not limited to COVID-19 who does not regularly go to a physician did not take any medications who presented with dizziness, nausea or vomiting.  He reported that if he developed a room spinning sensation with nausea and nonbloody vomiting and a mild headache 2 days ago which has persisted.  Symptoms are better at rest in a supine position and worse when he is up with activity and upright.  He was feeling generally weak earlier but did not have any focal weakness and denied any numbness or tingling.  Upon arrival to the ED he is found to be afebrile and he was saturating well on room air and labs were elevated for leukocytosis and he had a lactic acidosis of 3.9.  Further workup was initiated and he underwent a brain MRI which revealed multiple punctate acute infarcts within the right cerebellar hemisphere and right occipital lobe and so neurology was consulted and recommended continuing stroke workup and he was given IV fluids and meclizine and Valium.  Currently echocardiogram is still pending along with PT and OT evaluations.  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 Short segment occlusion at the origin  of the right AICA." -MRI Brain done and showed "Multiple punctate acute infarcts within the right cerebellar hemisphere and right occipital lobe. No hemorrhage or mass effect." -Check ECHOCardiogram and this is pending -Lipid Panel as below; HbA1c was 5.6 -Continue with normal saline at 100 mL/h for 20 hours and then also continue meclizine 25 mg p.o. 3 times daily as needed for dizziness and then diazepam 2 mg p.o. every 12 as needed for refractory Vertigo -Continue Neurochecks per protocol and continue to Monitor on Telemetry -PT/OT/SLP to further Evaluate and Treat -Neurology has placed the patient on prophylactic therapy with dual antiplatelet therapy with aspirin 81 mg p.o. daily and clopidogrel 75 mg p.o. daily after 300 mg load -Stroke team to follow  Lactic Acidosis -Lactic Acid Level Trend: Recent Labs  Lab 01/03/24 1253 01/03/24 1601 01/04/24 0419  LATICACIDVEN 3.9* 2.0* 1.0  -Improved and IV fluid hydration is being initiated and continued; patient is status post 1 L of lactated ringer bolus and 1 L of normal saline bolus and is now on maintenance IV fluids at 100 mL/h with normal saline  Renal Insufficiency -BUN/Cr Trend: Recent Labs  Lab 01/03/24 1242 01/04/24 0419 01/05/24 0649  BUN 15 15 14   CREATININE 1.34* 1.22 1.24  -Continue with IV fluid hydration as above -Avoid Nephrotoxic Medications, Contrast Dyes, Hypotension and Dehydration to Ensure Adequate Renal Perfusion and will need to Renally Adjust Meds -Continue to Monitor and Trend Renal Function carefully and repeat CMP in the AM   Hyperglycemia -Likely reactive -HbA1c was 5.6 -Glucose Trend: Recent Labs  Lab  01/03/24 1242 01/04/24 0419 01/05/24 0649  GLUCOSE 154* 95 91   Leukocytosis -Resolved. WBC Trend: Recent Labs  Lab 01/03/24 1242 01/04/24 0419 01/05/24 0649  WBC 11.3* 9.0 6.8  -Continue to Monitor for S/Sx of Infection; Repeat CBC in the AM  HLD -Patient's lipid panel done and showed  a total cholesterol/HDL ratio of 5.9, cholesterol 212, HDL of 36, LDL 154, triglycerides of 409, VLDL 22  Assessment and Plan: No notes have been filed under this hospital service. Service: Hospitalist     {Tip this will not be part of the note when signed There is no height or weight on file to calculate BMI. , ,  (Optional):26781}  {(NOTE) Pain control PDMP Statment (Optional):26782} Consultants: *** Procedures performed: ***  Disposition: {Plan; Disposition:26390} Diet recommendation:  {Diet_Plan:26776} DISCHARGE MEDICATION: Allergies as of 01/05/2024   No Known Allergies   Med Rec must be completed prior to using this [facility]***       Discharge Exam: There were no vitals filed for this visit.  Vitals:   01/05/24 0735 01/05/24 1108  BP: 137/80 133/86  Pulse: (!) 55 61  Resp: 17 17  Temp: 98.2 F (36.8 C) 98.1 F (36.7 C)  SpO2: 98% 100%   Examination: Physical Exam:  Constitutional: WN/WD, NAD and appears calm and comfortable Eyes: PERRL, lids and conjunctivae normal, sclerae anicteric  ENMT: External Ears, Nose appear normal. Grossly normal hearing. Mucous membranes are moist. Posterior pharynx clear of any exudate or lesions. Normal dentition.  Neck: Appears normal, supple, no cervical masses, normal ROM, no appreciable thyromegaly Respiratory: Clear to auscultation bilaterally, no wheezing, rales, rhonchi or crackles. Normal respiratory effort and patient is not tachypenic. No accessory muscle use.  Cardiovascular: RRR, no murmurs / rubs / gallops. S1 and S2 auscultated. No extremity edema. 2+ pedal pulses. No carotid bruits.  Abdomen: Soft, non-tender, non-distended. No masses palpated. No appreciable hepatosplenomegaly. Bowel sounds positive.  GU: Deferred. Musculoskeletal: No clubbing / cyanosis of digits/nails. No joint deformity upper and lower extremities. Good ROM, no contractures. Normal strength and muscle tone.  Skin: No rashes, lesions, ulcers.  No induration; Warm and dry.  Neurologic: CN 2-12 grossly intact with no focal deficits. Sensation intact in all 4 Extremities, DTR normal. Strength 5/5 in all 4. Romberg sign cerebellar reflexes not assessed.  Psychiatric: Normal judgment and insight. Alert and oriented x 3. Normal mood and appropriate affect.    Condition at discharge: {DC Condition:26389}  The results of significant diagnostics from this hospitalization (including imaging, microbiology, ancillary and laboratory) are listed below for reference.   Imaging Studies: ECHOCARDIOGRAM COMPLETE Result Date: 01/04/2024    ECHOCARDIOGRAM REPORT   Patient Name:   NOTNAMED SCHOLZ Date of Exam: 01/04/2024 Medical Rec #:  [id number]      Height:       63.0 in Accession #:    [id number]     Weight:       145.5 lb Date of Birth:  [birth year]       BSA:          1.689 m Patient Age:    52 years       BP:           145/66 mmHg Patient Gender: M              HR:           65 bpm. Exam Location:  Inpatient Procedure: 2D Echo, Color Doppler and Cardiac Doppler (Both Spectral and Color  Flow Doppler were utilized during procedure). Indications:    Stroke  History:        Patient has no prior history of Echocardiogram examinations.  Sonographer:    Rosaland Lao Sonographer#2:  Delcie Roch RDCS Referring Phys: [id number] TIMOTHY S OPYD IMPRESSIONS  1. Left ventricular ejection fraction, by estimation, is 60 to 65%. The left ventricle has normal function. The left ventricle has no regional wall motion abnormalities. Left ventricular diastolic parameters were normal.  2. Right ventricular systolic function is normal. The right ventricular size is normal.  3. The mitral valve is normal in structure. No evidence of mitral valve regurgitation. No evidence of mitral stenosis.  4. The aortic valve is normal in structure. Aortic valve regurgitation is mild. No aortic stenosis is present.  5. The inferior vena cava is normal in size with greater than 50%  respiratory variability, suggesting right atrial pressure of 3 mmHg. FINDINGS  Left Ventricle: Left ventricular ejection fraction, by estimation, is 60 to 65%. The left ventricle has normal function. The left ventricle has no regional wall motion abnormalities. Strain imaging was not performed. The left ventricular internal cavity  size was normal in size. There is no left ventricular hypertrophy. Left ventricular diastolic parameters were normal. Right Ventricle: The right ventricular size is normal. No increase in right ventricular wall thickness. Right ventricular systolic function is normal. Left Atrium: Left atrial size was normal in size. Right Atrium: Right atrial size was normal in size. Pericardium: There is no evidence of pericardial effusion. Mitral Valve: The mitral valve is normal in structure. No evidence of mitral valve regurgitation. No evidence of mitral valve stenosis. Tricuspid Valve: The tricuspid valve is normal in structure. Tricuspid valve regurgitation is not demonstrated. No evidence of tricuspid stenosis. Aortic Valve: The aortic valve is normal in structure. Aortic valve regurgitation is mild. No aortic stenosis is present. Pulmonic Valve: The pulmonic valve was normal in structure. Pulmonic valve regurgitation is not visualized. No evidence of pulmonic stenosis. Aorta: The aortic root is normal in size and structure. Venous: The inferior vena cava is normal in size with greater than 50% respiratory variability, suggesting right atrial pressure of 3 mmHg. IAS/Shunts: No atrial level shunt detected by color flow Doppler. Additional Comments: 3D imaging was not performed.  LEFT VENTRICLE PLAX 2D LVIDd:         5.00 cm   Diastology LVIDs:         3.10 cm   LV e' medial:    7.18 cm/s LV PW:         0.80 cm   LV E/e' medial:  6.8 LV IVS:        1.00 cm   LV e' lateral:   10.10 cm/s LVOT diam:     1.90 cm   LV E/e' lateral: 4.8 LV SV:         54 LV SV Index:   32 LVOT Area:     2.84 cm  RIGHT  VENTRICLE             IVC RV Basal diam:  3.90 cm     IVC diam: 1.60 cm RV Mid diam:    3.20 cm RV S prime:     12.10 cm/s TAPSE (M-mode): 2.5 cm LEFT ATRIUM             Index        RIGHT ATRIUM           Index LA diam:  3.10 cm 1.84 cm/m   RA Area:     17.50 cm LA Vol (A2C):   47.4 ml 28.06 ml/m  RA Volume:   47.80 ml  28.30 ml/m LA Vol (A4C):   36.9 ml 21.85 ml/m LA Biplane Vol: 42.3 ml 25.04 ml/m  AORTIC VALVE LVOT Vmax:   89.70 cm/s LVOT Vmean:  56.200 cm/s LVOT VTI:    0.189 m  AORTA Ao Root diam: 2.90 cm Ao Asc diam:  3.60 cm MITRAL VALVE MV Area (PHT): 2.29 cm    SHUNTS MV Decel Time: 331 msec    Systemic VTI:  0.19 m MV E velocity: 48.50 cm/s  Systemic Diam: 1.90 cm MV A velocity: 66.20 cm/s MV E/A ratio:  0.73 Mihai Croitoru MD Electronically signed by Thurmon Fair MD Signature Date/Time: 01/04/2024/3:59:51 PM    Final    CT ANGIO HEAD NECK W WO CM Result Date: 01/04/2024 CLINICAL DATA:  Stroke/TIA EXAM: CT ANGIOGRAPHY HEAD AND NECK WITH AND WITHOUT CONTRAST TECHNIQUE: Multidetector CT imaging of the head and neck was performed using the standard protocol during bolus administration of intravenous contrast. Multiplanar CT image reconstructions and MIPs were obtained to evaluate the vascular anatomy. Carotid stenosis measurements (when applicable) are obtained utilizing NASCET criteria, using the distal internal carotid diameter as the denominator. RADIATION DOSE REDUCTION: This exam was performed according to the departmental dose-optimization program which includes automated exposure control, adjustment of the mA and/or kV according to patient size and/or use of iterative reconstruction technique. CONTRAST:  75mL OMNIPAQUE IOHEXOL 350 MG/ML SOLN COMPARISON:  None Available. FINDINGS: CT HEAD FINDINGS Brain: No mass,hemorrhage or extra-axial collection. Normal appearance of the parenchyma and CSF spaces. Vascular: No hyperdense vessel or unexpected vascular calcification. Skull: The  visualized skull base, calvarium and extracranial soft tissues are normal. Sinuses/Orbits: No fluid levels or advanced mucosal thickening of the visualized paranasal sinuses. No mastoid or middle ear effusion. Normal orbits. CTA NECK FINDINGS Skeleton: No acute abnormality or high grade bony spinal canal stenosis. Other neck: Normal pharynx, larynx and major salivary glands. No cervical lymphadenopathy. Unremarkable thyroid gland. Upper chest: No pneumothorax or pleural effusion. No nodules or masses. Aortic arch: There is no calcific atherosclerosis of the aortic arch. Conventional 3 vessel aortic branching pattern. RIGHT carotid system: Normal without aneurysm, dissection or stenosis. LEFT carotid system: Normal without aneurysm, dissection or stenosis. Vertebral arteries: Right dominant configuration. There is no dissection, occlusion or flow-limiting stenosis to the skull base (V1-V3 segments). CTA HEAD FINDINGS POSTERIOR CIRCULATION: The left vertebral artery terminates in PICA. There is severe stenosis of the right vertebral artery at the vertebrobasilar junction. Short segment occlusion at the origin of the right AICA. Basilar artery is normal. Superior cerebellar arteries are normal. Posterior cerebral arteries are normal. ANTERIOR CIRCULATION: Intracranial internal carotid arteries are normal. Anterior cerebral arteries are normal. 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 Mild stenosis of the right MCA M1 segment. Venous sinuses: As permitted by contrast timing, patent. Anatomic variants: Fetal origin of the left posterior cerebral artery. Review of the MIP images confirms the above findings. IMPRESSION: 1. 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 2. 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 3. Short segment occlusion at the origin of the right AICA. Electronically Signed   By: Deatra Robinson M.D.   On: 01/04/2024 01:46   MR Brain W and Wo Contrast Result Date:  01/03/2024 CLINICAL DATA:  Acute neurologic deficit.  Dizziness for 2 days. EXAM:  MRI HEAD WITHOUT AND WITH CONTRAST TECHNIQUE: Multiplanar, multiecho pulse sequences of the brain and surrounding structures were obtained without and with intravenous contrast. CONTRAST:  7mL GADAVIST GADOBUTROL 1 MMOL/ML IV SOLN COMPARISON:  None Available. FINDINGS: Brain: There are multiple punctate foci of abnormal diffusion restriction within the right cerebellar hemisphere and right occipital lobe. No acute or chronic hemorrhage. Normal white matter signal, parenchymal volume and CSF spaces. The midline structures are normal. There is no abnormal contrast enhancement. Vascular: Normal flow voids. Skull and upper cervical spine: Normal calvarium and skull base. Visualized upper cervical spine and soft tissues are normal. Sinuses/Orbits:No paranasal sinus fluid levels or advanced mucosal thickening. No mastoid or middle ear effusion. Normal orbits. IMPRESSION: Multiple punctate acute infarcts within the right cerebellar hemisphere and right occipital lobe. No hemorrhage or mass effect. Electronically Signed   By: Deatra Robinson M.D.   On: 01/03/2024 22:33   DG Chest Port 1 View Result Date: 01/03/2024 CLINICAL DATA:  Sepsis. EXAM: PORTABLE CHEST 1 VIEW COMPARISON:  11/21/2021 FINDINGS: The heart size and mediastinal contours are within normal limits. Low lung volumes again noted. Both lungs are clear. The visualized skeletal structures are unremarkable. IMPRESSION: Low lung volumes. No active disease. Electronically Signed   By: Danae Orleans M.D.   On: 01/03/2024 13:28    Microbiology: Results for orders placed or performed during the hospital encounter of 01/03/24  Blood Culture (routine x 2)     Status: None (Preliminary result)   Collection Time: 01/03/24 12:30 PM   Specimen: BLOOD  Result Value Ref Range Status   Specimen Description BLOOD RIGHT ANTECUBITAL  Final   Special Requests   Final    BOTTLES DRAWN  AEROBIC AND ANAEROBIC Blood Culture results may not be optimal due to an inadequate volume of blood received in culture bottles   Culture   Final    NO GROWTH 2 DAYS Performed at [facility], [address], [place], [place] [address]    Report Status PENDING  Incomplete  Blood Culture (routine x 2)     Status: None (Preliminary result)   Collection Time: 01/03/24 12:35 PM   Specimen: BLOOD RIGHT HAND  Result Value Ref Range Status   Specimen Description BLOOD RIGHT HAND  Final   Special Requests   Final    BOTTLES DRAWN AEROBIC AND ANAEROBIC Blood Culture results may not be optimal due to an inadequate volume of blood received in culture bottles   Culture   Final    NO GROWTH 2 DAYS Performed at [facility], [address], [place], [place] [address]    Report Status PENDING  Incomplete  Resp panel by RT-PCR (RSV, Flu A&B, Covid) Anterior Nasal Swab     Status: None   Collection Time: 01/03/24 12:49 PM   Specimen: Anterior Nasal Swab  Result Value Ref Range Status   SARS Coronavirus 2 by RT PCR NEGATIVE NEGATIVE Final   Influenza A by PCR NEGATIVE NEGATIVE Final   Influenza B by PCR NEGATIVE NEGATIVE Final    Comment: (NOTE) The Xpert Xpress SARS-CoV-2/FLU/RSV plus assay is intended as an aid in the diagnosis of influenza from Nasopharyngeal swab specimens and should not be used as a sole basis for treatment. Nasal washings and aspirates are unacceptable for Xpert Xpress SARS-CoV-2/FLU/RSV testing.  Fact Sheet for Patients: BloggerCourse.com  Fact Sheet for Healthcare Providers: SeriousBroker.it  This test is not yet approved or cleared by the [place] FDA and has been  authorized for detection and/or diagnosis of SARS-CoV-2 by FDA under an Emergency Use Authorization (EUA). This EUA will remain in effect (meaning this test can be used) for the duration of the COVID-19 declaration under Section 564(b)(1) of  the Act, 21 U.S.C. section 360bbb-3(b)(1), unless the authorization is terminated or revoked.     Resp Syncytial Virus by PCR NEGATIVE NEGATIVE Final    Comment: (NOTE) Fact Sheet for Patients: BloggerCourse.com  Fact Sheet for Healthcare Providers: SeriousBroker.it  This test is not yet approved or cleared by the [place] FDA and has been authorized for detection and/or diagnosis of SARS-CoV-2 by FDA under an Emergency Use Authorization (EUA). This EUA will remain in effect (meaning this test can be used) for the duration of the COVID-19 declaration under Section 564(b)(1) of the Act, 21 U.S.C. section 360bbb-3(b)(1), unless the authorization is terminated or revoked.  Performed at [facility], [address] [place], [place] [address]     Labs: CBC: Recent Labs  Lab 01/03/24 1242 01/04/24 0419 01/05/24 0649  WBC 11.3* 9.0 6.8  NEUTROABS 9.0*  --  3.2  HGB 16.1 14.9 14.8  HCT 49.8 46.2 44.6  MCV 84.0 83.7 83.7  PLT 151 158 153   Basic Metabolic Panel: Recent Labs  Lab 01/03/24 1242 01/04/24 0419 01/05/24 0649  NA 136 137 138  K 4.7 3.5 3.7  CL 103 106 108  CO2 22 22 23   GLUCOSE 154* 95 91  BUN 15 15 14   CREATININE 1.34* 1.22 1.24  CALCIUM 9.3 8.7* 8.9  MG  --   --  1.8  PHOS  --   --  2.5   Liver Function Tests: Recent Labs  Lab 01/03/24 1242 01/05/24 0649  AST 31 21  ALT 23 17  ALKPHOS 69 53  BILITOT 0.6 1.0  PROT 7.8 6.6  ALBUMIN 4.0 3.2*   CBG: Recent Labs  Lab 01/03/24 1244  GLUCAP 163*    Discharge time spent: {LESS THAN/GREATER THAN:26388} 30 minutes.  Signed: Merlene Laughter, DO [facility] 01/05/2024

## 2024-01-05 NOTE — Progress Notes (Addendum)
 STROKE TEAM PROGRESS NOTE   INTERIM HISTORY/SUBJECTIVE   Daughter is at the bedside.  Patient is sitting in the chair in no apparent distress.  Patient states all symptoms are resolved and he is feeling better.  We have ordered hypercoagulable panel, lower extremity Dopplers. He is scheduled for TEE tomorrow and potentially will need a loop prior to discharge Will do aspirin 81 mg and Plavix 75 mg for 3 months then aspirin alone  No new neurological events overnight No neurological deficits  OBJECTIVE  CBC    Component Value Date/Time   WBC 6.8 01/05/2024 0649   RBC 5.33 01/05/2024 0649   HGB 14.8 01/05/2024 0649   HCT 44.6 01/05/2024 0649   PLT 153 01/05/2024 0649   MCV 83.7 01/05/2024 0649   MCH 27.8 01/05/2024 0649   MCHC 33.2 01/05/2024 0649   RDW 13.8 01/05/2024 0649   LYMPHSABS 1.8 01/05/2024 0649   MONOABS 0.4 01/05/2024 0649   EOSABS 1.4 (H) 01/05/2024 0649   BASOSABS 0.0 01/05/2024 0649    BMET    Component Value Date/Time   NA 138 01/05/2024 0649   K 3.7 01/05/2024 0649   CL 108 01/05/2024 0649   CO2 23 01/05/2024 0649   GLUCOSE 91 01/05/2024 0649   BUN 14 01/05/2024 0649   CREATININE 1.24 01/05/2024 0649   CALCIUM 8.9 01/05/2024 0649   GFRNONAA >60 01/05/2024 0649    IMAGING past 24 hours ECHOCARDIOGRAM COMPLETE Result Date: 01/04/2024    ECHOCARDIOGRAM REPORT   Patient Name:   Melvin Mills Date of Exam: 01/04/2024 Medical Rec #:  [id number]      Height:       63.0 in Accession #:    [id number]     Weight:       145.5 lb Date of Birth:  04/28/1971       BSA:          1.689 m Patient Age:    52 years       BP:           145/66 mmHg Patient Gender: M              HR:           65 bpm. Exam Location:  Inpatient Procedure: 2D Echo, Color Doppler and Cardiac Doppler (Both Spectral and Color            Flow Doppler were utilized during procedure). Indications:    Stroke  History:        Patient has no prior history of Echocardiogram examinations.  Sonographer:     Rosaland Lao Sonographer#2:  Delcie Roch RDCS Referring Phys: [id number] TIMOTHY S OPYD IMPRESSIONS  1. Left ventricular ejection fraction, by estimation, is 60 to 65%. The left ventricle has normal function. The left ventricle has no regional wall motion abnormalities. Left ventricular diastolic parameters were normal.  2. Right ventricular systolic function is normal. The right ventricular size is normal.  3. The mitral valve is normal in structure. No evidence of mitral valve regurgitation. No evidence of mitral stenosis.  4. The aortic valve is normal in structure. Aortic valve regurgitation is mild. No aortic stenosis is present.  5. The inferior vena cava is normal in size with greater than 50% respiratory variability, suggesting right atrial pressure of 3 mmHg. FINDINGS  Left Ventricle: Left ventricular ejection fraction, by estimation, is 60 to 65%. The left ventricle has normal function. The left ventricle has no regional wall motion abnormalities. Strain imaging was  not performed. The left ventricular internal cavity  size was normal in size. There is no left ventricular hypertrophy. Left ventricular diastolic parameters were normal. Right Ventricle: The right ventricular size is normal. No increase in right ventricular wall thickness. Right ventricular systolic function is normal. Left Atrium: Left atrial size was normal in size. Right Atrium: Right atrial size was normal in size. Pericardium: There is no evidence of pericardial effusion. Mitral Valve: The mitral valve is normal in structure. No evidence of mitral valve regurgitation. No evidence of mitral valve stenosis. Tricuspid Valve: The tricuspid valve is normal in structure. Tricuspid valve regurgitation is not demonstrated. No evidence of tricuspid stenosis. Aortic Valve: The aortic valve is normal in structure. Aortic valve regurgitation is mild. No aortic stenosis is present. Pulmonic Valve: The pulmonic valve was normal in structure.  Pulmonic valve regurgitation is not visualized. No evidence of pulmonic stenosis. Aorta: The aortic root is normal in size and structure. Venous: The inferior vena cava is normal in size with greater than 50% respiratory variability, suggesting right atrial pressure of 3 mmHg. IAS/Shunts: No atrial level shunt detected by color flow Doppler. Additional Comments: 3D imaging was not performed.  LEFT VENTRICLE PLAX 2D LVIDd:         5.00 cm   Diastology LVIDs:         3.10 cm   LV e' medial:    7.18 cm/s LV PW:         0.80 cm   LV E/e' medial:  6.8 LV IVS:        1.00 cm   LV e' lateral:   10.10 cm/s LVOT diam:     1.90 cm   LV E/e' lateral: 4.8 LV SV:         54 LV SV Index:   32 LVOT Area:     2.84 cm  RIGHT VENTRICLE             IVC RV Basal diam:  3.90 cm     IVC diam: 1.60 cm RV Mid diam:    3.20 cm RV S prime:     12.10 cm/s TAPSE (M-mode): 2.5 cm LEFT ATRIUM             Index        RIGHT ATRIUM           Index LA diam:        3.10 cm 1.84 cm/m   RA Area:     17.50 cm LA Vol (A2C):   47.4 ml 28.06 ml/m  RA Volume:   47.80 ml  28.30 ml/m LA Vol (A4C):   36.9 ml 21.85 ml/m LA Biplane Vol: 42.3 ml 25.04 ml/m  AORTIC VALVE LVOT Vmax:   89.70 cm/s LVOT Vmean:  56.200 cm/s LVOT VTI:    0.189 m  AORTA Ao Root diam: 2.90 cm Ao Asc diam:  3.60 cm MITRAL VALVE MV Area (PHT): 2.29 cm    SHUNTS MV Decel Time: 331 msec    Systemic VTI:  0.19 m MV E velocity: 48.50 cm/s  Systemic Diam: 1.90 cm MV A velocity: 66.20 cm/s MV E/A ratio:  0.73 Mihai Croitoru MD Electronically signed by Thurmon Fair MD Signature Date/Time: 01/04/2024/3:59:51 PM    Final     Vitals:   01/04/24 2310 01/05/24 0306 01/05/24 0735 01/05/24 1108  BP: (!) 144/98 129/77 137/80 133/86  Pulse: (!) 57 60 (!) 55 61  Resp: 17 18 17 17   Temp: 97.9 F (36.6  C) 98.1 F (36.7 C) 98.2 F (36.8 C) 98.1 F (36.7 C)  TempSrc: Oral Oral Oral Oral  SpO2: 98% 98% 98% 100%     PHYSICAL EXAM General:  Alert, well-nourished, well-developed patient  in no acute distress Psych:  Mood and affect appropriate for situation CV: Regular rate and rhythm on monitor Respiratory:  Regular, unlabored respirations on room air GI: Abdomen soft and nontender   NEURO:  Mental Status: AA&Ox3, patient is able to give clear and coherent history Speech/Language: speech is without dysarthria or aphasia.  Naming, repetition, fluency, and comprehension intact.  Cranial Nerves:  II: PERRL. Visual fields full.  III, IV, VI: EOMI. Eyelids elevate symmetrically.  V: Sensation is intact to light touch and symmetrical to face.  VII: Face is symmetrical resting and smiling VIII: hearing intact to voice. IX, X: Palate elevates symmetrically. Phonation is normal.  NW:GNFAOZHY shrug 5/5. XII: tongue is midline without fasciculations. Motor: 5/5 strength to all muscle groups tested.  Tone: is normal and bulk is normal Sensation- Intact to light touch bilaterally. Extinction absent to light touch to DSS.   Coordination: FTN intact bilaterally, HKS: no ataxia in BLE.No drift.  Gait- deferred  Most Recent NIH: 0     ASSESSMENT/PLAN  Mr. Melvin Mills is a 53 y.o. male with unknown past medical history admitted with multifocal posterior circulation strokes.  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 TEE scheduled for tomorrow. May need loop recorder Hypercoagulable panel pending LE [place] ordered LDL 154 HgbA1c 5.6 VTE prophylaxis - lovenox No antithrombotic prior to admission, now on aspirin 81 mg daily and clopidogrel 75 mg daily for  3 months then aspirin alone Therapy recommendations:  Pending Disposition:  pending  History of A-fib  One-time event during illness in 2020- CHA2DS2 VASc score is 0 at that time no anticoagulation was started  Hypertension Home meds:  none Stable BP goal normotensive, as symptoms are outside of permissive hypertension window.   Hyperlipidemia Home meds:  none LDL 154, goal < 70 Add Lipitor 80 mg Continue statin at discharge  Diabetes type II, no history Home meds:  none HgbA1c 5.6, goal < 7.0 CBGs SSI Recommend close follow-up with PCP   Hospital day # 0   Pt seen by Neuro NP/APP and later by MD. Note/plan to be edited by MD as needed.     Gevena Mart DNP, ACNPC-AG  [facility]  ATTENDING ATTESTATION:  53 year old with posterior circulation strokes of unknown etiology.  LDL is 154.  Exam is benign with NIH stroke scale of 0.  Echo was completed and negative.  Will need TEE and loop monitor if etiology still unclear.  Lower extremity Dopplers to complete young stroke workup addition to hypercoagulable.  Dr. Viviann Spare evaluated pt independently, reviewed imaging, chart, labs. Discussed and formulated plan with the Resident/APP. Changes were made to the note where appropriate. Please see APP/resident note above for details.   Total 36 minutes spent on counseling patient and coordinating care, writing notes and reviewing chart.   Nataleigh Griffin,MD  To contact Stroke Continuity provider, please refer to [email or web address]. After hours, contact General Neurology

## 2024-01-05 NOTE — Progress Notes (Signed)
 Lower extremity venous duplex completed. Please see CV Procedures for preliminary results.  Shona Simpson, RVT 01/05/24 2:35 PM

## 2024-01-05 NOTE — Progress Notes (Addendum)
 PROGRESS NOTE    Melvin Mills  MVH:[id number] DOB: [birth year] DOA: 01/03/2024 PCP: Grayce Sessions, NP   Brief Narrative:  Patient is a 53 year old [name] speaking male with no real past medical history who presented with dizziness, nausea or vomiting.  He described it as room spinning and had a mild headache which persisted so it prompted him to get further evaluation.  Workup was initiated and he underwent a brain MRI which revealed multiple punctate acute infarcts within the right cerebellar hemisphere and right occipital lobe. Neurology was consulted and recommended stroke workup as the suspected embolic etiology. He was given IV fluids, meclizine and Valium and has completed the majority of the stroke workup but they are recommending TEE and possible loop implantation with EP and he is undergoing a hypercoagulable workup.  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 Short segment occlusion at the origin of the right AICA." -MRI Brain done and showed "Multiple punctate acute infarcts within the right cerebellar hemisphere and right occipital lobe. No hemorrhage or mass effect." -Check ECHOCardiogram and showed LVEF of 60-65%, no RWMA  and normal LVDP with RVSF being normal -Lipid Panel as below; HbA1c was 5.6 -IVF now stopped; Continuing Meclizine 25 mg p.o. 3 times daily as needed for dizziness and then diazepam 2 mg p.o. every 12 as needed for refractory Vertigo -C/w Neurochecks per protocol and continue to Monitor on Telemetry -PT/OT/SLP to further Evaluate and Treat -Neurology has placed the patient on prophylactic therapy with dual antiplatelet therapy with aspirin 81 mg p.o. daily and  clopidogrel 75 mg p.o. daily after 300 mg load -Stroke team to follow and recommending Hypercoagulable w/u hypercoagulable panel has been ordered as well as a LE Duplex which showed No DVT bilaterally -EP to be consulted and for ? Loop pending TEE findings in the AM  Lactic Acidosis -Lactic Acid Level Trend went from 3.9 -> 2.0 -> 1.0 -Improved and s/p IVF Hydration  History of Atrial Fibrillation -Had a solitary episode of A-fib in 2020 with COVID illness with no recurrence but presents with a so is not undergoing TEE in the a.m.  Renal Insufficiency, improving -BUN/Cr is now 14/1.24 -Avoid Nephrotoxic Medications, Contrast Dyes, Hypotension and Dehydration to Ensure Adequate Renal Perfusion and will need to Renally Adjust Meds -Continue to Monitor and Trend Renal Function carefully and repeat BMP in the AM   Hyperglycemia -Likely reactive as HbA1c was 5.6 and Glucose Trend ranging from 91-154 on daily CMP  Leukocytosis, resolved.  -WBC Trend is now 6.8 -Continue to Monitor for S/Sx of Infection; Repeat CBC in AM if necessary  HLD -Patient's lipid panel done and showed a total cholesterol/HDL ratio of 5.9, cholesterol 212, HDL of 36, LDL 154, triglycerides of 841, VLDL 22 -C/w Atorvastatin 80 mg po Daily given that his LDL goal is <70  Hypoalbuminemia -Patient's Albumin Trend ranging from 3.2-4.0; Will not repeat given   DVT prophylaxis: enoxaparin (LOVENOX) injection 40 mg Start: 01/04/24 1000    Code Status: Full Code Family Communication: Discussed with Daughter at bedside  Disposition Plan:  Level of care: Telemetry Medical Status is: Observation The patient will require care spanning > 2 midnights and should be moved to inpatient because: Needs further workup and clearance by  Neuro   Consultants:  Neurology Cardiology  Procedures:  ECHOCardiogram LE Venous Duplex  Antimicrobials:  Anti-infectives (From admission, onward)    Start     Dose/Rate Route Frequency  Ordered Stop   01/03/24 1315  ceFEPIme (MAXIPIME) 2 g in sodium chloride 0.9 % 100 mL IVPB  Status:  Discontinued        2 g 200 mL/hr over 30 Minutes Intravenous  Once 01/03/24 1311 01/03/24 1351   01/03/24 1315  metroNIDAZOLE (FLAGYL) IVPB 500 mg  Status:  Discontinued        500 mg 100 mL/hr over 60 Minutes Intravenous  Once 01/03/24 1311 01/03/24 1351   01/03/24 1315  vancomycin (VANCOCIN) IVPB 1000 mg/200 mL premix  Status:  Discontinued        1,000 mg 200 mL/hr over 60 Minutes Intravenous  Once 01/03/24 1311 01/03/24 1351       Subjective: Seen and examined at bedside and sitting at the chair and feels fairly well and wanting to go home.  Denied complaints and states his dizziness is improved.  Daughter at bedside helping to translate.  No nausea or vomiting and awaiting stroke team to evaluate today.  Objective: Vitals:   01/04/24 2310 01/05/24 0306 01/05/24 0735 01/05/24 1108  BP: (!) 144/98 129/77 137/80 133/86  Pulse: (!) 57 60 (!) 55 61  Resp: 17 18 17 17   Temp: 97.9 F (36.6 C) 98.1 F (36.7 C) 98.2 F (36.8 C) 98.1 F (36.7 C)  TempSrc: Oral Oral Oral Oral  SpO2: 98% 98% 98% 100%    Intake/Output Summary (Last 24 hours) at 01/05/2024 1546 Last data filed at 01/05/2024 1200 Gross per 24 hour  Intake 2465.85 ml  Output --  Net 2465.85 ml   There were no vitals filed for this visit.  Examination: Physical Exam:  Constitutional: WN/WD [name] male in no acute distress sitting in the chair at bedside Respiratory: Diminished to auscultation bilaterally, no wheezing, rales, rhonchi or crackles. Normal respiratory effort and patient is not tachypenic. No accessory muscle use.  Unlabored breathing Cardiovascular: RRR, no murmurs / rubs / gallops. S1 and S2 auscultated. No extremity edema.  Abdomen: Soft, non-tender, non-distended. Bowel sounds positive.  GU: Deferred. Musculoskeletal: No clubbing / cyanosis of digits/nails. No joint deformity upper and lower  extremities Skin: No rashes, lesions, ulcers on limited skin evaluation. No induration; Warm and dry.  Neurologic: CN 2-12 grossly intact with no focal deficits.  Psychiatric: He is awake and alert and appears calm  Data Reviewed: I have personally reviewed following labs and imaging studies  CBC: Recent Labs  Lab 01/03/24 1242 01/04/24 0419 01/05/24 0649  WBC 11.3* 9.0 6.8  NEUTROABS 9.0*  --  3.2  HGB 16.1 14.9 14.8  HCT 49.8 46.2 44.6  MCV 84.0 83.7 83.7  PLT 151 158 153   Basic Metabolic Panel: Recent Labs  Lab 01/03/24 1242 01/04/24 0419 01/05/24 0649  NA 136 137 138  K 4.7 3.5 3.7  CL 103 106 108  CO2 22 22 23   GLUCOSE 154* 95 91  BUN 15 15 14   CREATININE 1.34* 1.22 1.24  CALCIUM 9.3 8.7* 8.9  MG  --   --  1.8  PHOS  --   --  2.5   GFR: CrCl cannot be calculated (Unknown ideal weight.). Liver Function Tests: Recent Labs  Lab 01/03/24 1242 01/05/24 0649  AST 31 21  ALT 23 17  ALKPHOS 69 53  BILITOT 0.6 1.0  PROT 7.8 6.6  ALBUMIN 4.0 3.2*   No results for input(s): "LIPASE", "AMYLASE" in the last 168 hours. No results for input(s): "AMMONIA" in the last 168 hours. Coagulation Profile: Recent Labs  Lab 01/03/24 1242  INR 1.0   Cardiac Enzymes: No results for input(s): "CKTOTAL", "CKMB", "CKMBINDEX", "TROPONINI" in the last 168 hours. BNP (last 3 results) No results for input(s): "PROBNP" in the last 8760 hours. HbA1C: Recent Labs    01/04/24 0419  HGBA1C 5.6   CBG: Recent Labs  Lab 01/03/24 1244  GLUCAP 163*   Lipid Profile: Recent Labs    01/04/24 0419  CHOL 212*  HDL 36*  LDLCALC 154*  TRIG 108  CHOLHDL 5.9   Thyroid Function Tests: No results for input(s): "TSH", "T4TOTAL", "FREET4", "T3FREE", "THYROIDAB" in the last 72 hours. Anemia Panel: No results for input(s): "VITAMINB12", "FOLATE", "FERRITIN", "TIBC", "IRON", "RETICCTPCT" in the last 72 hours. Sepsis Labs: Recent Labs  Lab 01/03/24 1253 01/03/24 1601  01/04/24 0419  LATICACIDVEN 3.9* 2.0* 1.0    Recent Results (from the past 240 hours)  Blood Culture (routine x 2)     Status: None (Preliminary result)   Collection Time: 01/03/24 12:30 PM   Specimen: BLOOD  Result Value Ref Range Status   Specimen Description BLOOD RIGHT ANTECUBITAL  Final   Special Requests   Final    BOTTLES DRAWN AEROBIC AND ANAEROBIC Blood Culture results may not be optimal due to an inadequate volume of blood received in culture bottles   Culture   Final    NO GROWTH 2 DAYS Performed at [facility], [address] [place], [place] [address]    Report Status PENDING  Incomplete  Blood Culture (routine x 2)     Status: None (Preliminary result)   Collection Time: 01/03/24 12:35 PM   Specimen: BLOOD RIGHT HAND  Result Value Ref Range Status   Specimen Description BLOOD RIGHT HAND  Final   Special Requests   Final    BOTTLES DRAWN AEROBIC AND ANAEROBIC Blood Culture results may not be optimal due to an inadequate volume of blood received in culture bottles   Culture   Final    NO GROWTH 2 DAYS Performed at [facility], [address] [place], [place] [address]    Report Status PENDING  Incomplete  Resp panel by RT-PCR (RSV, Flu A&B, Covid) Anterior Nasal Swab     Status: None   Collection Time: 01/03/24 12:49 PM   Specimen: Anterior Nasal Swab  Result Value Ref Range Status   SARS Coronavirus 2 by RT PCR NEGATIVE NEGATIVE Final   Influenza A by PCR NEGATIVE NEGATIVE Final   Influenza B by PCR NEGATIVE NEGATIVE Final    Comment: (NOTE) The Xpert Xpress SARS-CoV-2/FLU/RSV plus assay is intended as an aid in the diagnosis of influenza from Nasopharyngeal swab specimens and should not be used as a sole basis for treatment. Nasal washings and aspirates are unacceptable for Xpert Xpress SARS-CoV-2/FLU/RSV testing.  Fact Sheet for Patients: BloggerCourse.com  Fact Sheet for Healthcare  Providers: SeriousBroker.it  This test is not yet approved or cleared by the [place] FDA and has been authorized for detection and/or diagnosis of SARS-CoV-2 by FDA under an Emergency Use Authorization (EUA). This EUA will remain in effect (meaning this test can be used) for the duration of the COVID-19 declaration under Section 564(b)(1) of the Act, 21 U.S.C. section 360bbb-3(b)(1), unless the authorization is terminated or revoked.     Resp Syncytial Virus by  PCR NEGATIVE NEGATIVE Final    Comment: (NOTE) Fact Sheet for Patients: BloggerCourse.com  Fact Sheet for Healthcare Providers: SeriousBroker.it  This test is not yet approved or cleared by the [place] FDA and has been authorized for detection and/or diagnosis of SARS-CoV-2 by FDA under an Emergency Use Authorization (EUA). This EUA will remain in effect (meaning this test can be used) for the duration of the COVID-19 declaration under Section 564(b)(1) of the Act, 21 U.S.C. section 360bbb-3(b)(1), unless the authorization is terminated or revoked.  Performed at [facility], [address] [place], [place] [address]     Radiology Studies: VAS [place] LOWER EXTREMITY VENOUS (DVT) Result Date: 01/05/2024  Lower Venous DVT Study Patient Name:  ZURIEL ROSKOS  Date of Exam:   01/05/2024 Medical Rec #: [id number]       Accession #:    [id number] Date of Birth: 11/28/1970        Patient Gender: M Patient Age:   5 years Exam Location:  [facility] Procedure:      VAS [place] LOWER EXTREMITY VENOUS (DVT) Referring Phys: Leticia Penna --------------------------------------------------------------------------------  Indications: Stroke.  Risk Factors: None identified. Comparison Study: None. Performing Technologist: Shona Simpson  Examination Guidelines: A complete evaluation includes B-mode imaging, spectral Doppler, color Doppler, and power  Doppler as needed of all accessible portions of each vessel. Bilateral testing is considered an integral part of a complete examination. Limited examinations for reoccurring indications may be performed as noted. The reflux portion of the exam is performed with the patient in reverse Trendelenburg.  +---------+---------------+---------+-----------+----------+--------------+ RIGHT    CompressibilityPhasicitySpontaneityPropertiesThrombus Aging +---------+---------------+---------+-----------+----------+--------------+ CFV      Full           Yes      Yes                                 +---------+---------------+---------+-----------+----------+--------------+ SFJ      Full                                                        +---------+---------------+---------+-----------+----------+--------------+ FV Prox  Full                                                        +---------+---------------+---------+-----------+----------+--------------+ FV Mid   Full                                                        +---------+---------------+---------+-----------+----------+--------------+ FV DistalFull                                                        +---------+---------------+---------+-----------+----------+--------------+ PFV      Full                                                        +---------+---------------+---------+-----------+----------+--------------+  POP      Full           Yes      Yes                                 +---------+---------------+---------+-----------+----------+--------------+ PTV      Full                                                        +---------+---------------+---------+-----------+----------+--------------+ PERO     Full                                                        +---------+---------------+---------+-----------+----------+--------------+    +---------+---------------+---------+-----------+----------+--------------+ LEFT     CompressibilityPhasicitySpontaneityPropertiesThrombus Aging +---------+---------------+---------+-----------+----------+--------------+ CFV      Full           Yes      Yes                                 +---------+---------------+---------+-----------+----------+--------------+ SFJ      Full                                                        +---------+---------------+---------+-----------+----------+--------------+ FV Prox  Full                                                        +---------+---------------+---------+-----------+----------+--------------+ FV Mid   Full                                                        +---------+---------------+---------+-----------+----------+--------------+ FV DistalFull                                                        +---------+---------------+---------+-----------+----------+--------------+ PFV      Full                                                        +---------+---------------+---------+-----------+----------+--------------+ POP      Full           Yes      Yes                                 +---------+---------------+---------+-----------+----------+--------------+  PTV      Full                                                        +---------+---------------+---------+-----------+----------+--------------+ PERO     Full                                                        +---------+---------------+---------+-----------+----------+--------------+     Summary: BILATERAL: - No evidence of deep vein thrombosis seen in the lower extremities, bilaterally. -No evidence of popliteal cyst, bilaterally.   *See table(s) above for measurements and observations. Electronically signed by Gerarda Fraction on 01/05/2024 at 3:41:11 PM.    Final    ECHOCARDIOGRAM COMPLETE Result Date: 01/04/2024     ECHOCARDIOGRAM REPORT   Patient Name:   SANTI TROUNG Date of Exam: 01/04/2024 Medical Rec #:  [id number]      Height:       63.0 in Accession #:    [id number]     Weight:       145.5 lb Date of Birth:  1971-01-27       BSA:          1.689 m Patient Age:    52 years       BP:           145/66 mmHg Patient Gender: M              HR:           65 bpm. Exam Location:  Inpatient Procedure: 2D Echo, Color Doppler and Cardiac Doppler (Both Spectral and Color            Flow Doppler were utilized during procedure). Indications:    Stroke  History:        Patient has no prior history of Echocardiogram examinations.  Sonographer:    Rosaland Lao Sonographer#2:  Delcie Roch RDCS Referring Phys: [id number] TIMOTHY S OPYD IMPRESSIONS  1. Left ventricular ejection fraction, by estimation, is 60 to 65%. The left ventricle has normal function. The left ventricle has no regional wall motion abnormalities. Left ventricular diastolic parameters were normal.  2. Right ventricular systolic function is normal. The right ventricular size is normal.  3. The mitral valve is normal in structure. No evidence of mitral valve regurgitation. No evidence of mitral stenosis.  4. The aortic valve is normal in structure. Aortic valve regurgitation is mild. No aortic stenosis is present.  5. The inferior vena cava is normal in size with greater than 50% respiratory variability, suggesting right atrial pressure of 3 mmHg. FINDINGS  Left Ventricle: Left ventricular ejection fraction, by estimation, is 60 to 65%. The left ventricle has normal function. The left ventricle has no regional wall motion abnormalities. Strain imaging was not performed. The left ventricular internal cavity  size was normal in size. There is no left ventricular hypertrophy. Left ventricular diastolic parameters were normal. Right Ventricle: The right ventricular size is normal. No increase in right ventricular wall thickness. Right ventricular systolic function is normal.  Left Atrium: Left atrial size was normal in size. Right Atrium: Right atrial size was normal in size. Pericardium: There is no evidence  of pericardial effusion. Mitral Valve: The mitral valve is normal in structure. No evidence of mitral valve regurgitation. No evidence of mitral valve stenosis. Tricuspid Valve: The tricuspid valve is normal in structure. Tricuspid valve regurgitation is not demonstrated. No evidence of tricuspid stenosis. Aortic Valve: The aortic valve is normal in structure. Aortic valve regurgitation is mild. No aortic stenosis is present. Pulmonic Valve: The pulmonic valve was normal in structure. Pulmonic valve regurgitation is not visualized. No evidence of pulmonic stenosis. Aorta: The aortic root is normal in size and structure. Venous: The inferior vena cava is normal in size with greater than 50% respiratory variability, suggesting right atrial pressure of 3 mmHg. IAS/Shunts: No atrial level shunt detected by color flow Doppler. Additional Comments: 3D imaging was not performed.  LEFT VENTRICLE PLAX 2D LVIDd:         5.00 cm   Diastology LVIDs:         3.10 cm   LV e' medial:    7.18 cm/s LV PW:         0.80 cm   LV E/e' medial:  6.8 LV IVS:        1.00 cm   LV e' lateral:   10.10 cm/s LVOT diam:     1.90 cm   LV E/e' lateral: 4.8 LV SV:         54 LV SV Index:   32 LVOT Area:     2.84 cm  RIGHT VENTRICLE             IVC RV Basal diam:  3.90 cm     IVC diam: 1.60 cm RV Mid diam:    3.20 cm RV S prime:     12.10 cm/s TAPSE (M-mode): 2.5 cm LEFT ATRIUM             Index        RIGHT ATRIUM           Index LA diam:        3.10 cm 1.84 cm/m   RA Area:     17.50 cm LA Vol (A2C):   47.4 ml 28.06 ml/m  RA Volume:   47.80 ml  28.30 ml/m LA Vol (A4C):   36.9 ml 21.85 ml/m LA Biplane Vol: 42.3 ml 25.04 ml/m  AORTIC VALVE LVOT Vmax:   89.70 cm/s LVOT Vmean:  56.200 cm/s LVOT VTI:    0.189 m  AORTA Ao Root diam: 2.90 cm Ao Asc diam:  3.60 cm MITRAL VALVE MV Area (PHT): 2.29 cm    SHUNTS MV  Decel Time: 331 msec    Systemic VTI:  0.19 m MV E velocity: 48.50 cm/s  Systemic Diam: 1.90 cm MV A velocity: 66.20 cm/s MV E/A ratio:  0.73 Mihai Croitoru MD Electronically signed by Thurmon Fair MD Signature Date/Time: 01/04/2024/3:59:51 PM    Final    CT ANGIO HEAD NECK W WO CM Result Date: 01/04/2024 CLINICAL DATA:  Stroke/TIA EXAM: CT ANGIOGRAPHY HEAD AND NECK WITH AND WITHOUT CONTRAST TECHNIQUE: Multidetector CT imaging of the head and neck was performed using the standard protocol during bolus administration of intravenous contrast. Multiplanar CT image reconstructions and MIPs were obtained to evaluate the vascular anatomy. Carotid stenosis measurements (when applicable) are obtained utilizing NASCET criteria, using the distal internal carotid diameter as the denominator. RADIATION DOSE REDUCTION: This exam was performed according to the departmental dose-optimization program which includes automated exposure control, adjustment of the mA and/or kV according to patient size and/or use  of iterative reconstruction technique. CONTRAST:  75mL OMNIPAQUE IOHEXOL 350 MG/ML SOLN COMPARISON:  None Available. FINDINGS: CT HEAD FINDINGS Brain: No mass,hemorrhage or extra-axial collection. Normal appearance of the parenchyma and CSF spaces. Vascular: No hyperdense vessel or unexpected vascular calcification. Skull: The visualized skull base, calvarium and extracranial soft tissues are normal. Sinuses/Orbits: No fluid levels or advanced mucosal thickening of the visualized paranasal sinuses. No mastoid or middle ear effusion. Normal orbits. CTA NECK FINDINGS Skeleton: No acute abnormality or high grade bony spinal canal stenosis. Other neck: Normal pharynx, larynx and major salivary glands. No cervical lymphadenopathy. Unremarkable thyroid gland. Upper chest: No pneumothorax or pleural effusion. No nodules or masses. Aortic arch: There is no calcific atherosclerosis of the aortic arch. Conventional 3 vessel aortic  branching pattern. RIGHT carotid system: Normal without aneurysm, dissection or stenosis. LEFT carotid system: Normal without aneurysm, dissection or stenosis. Vertebral arteries: Right dominant configuration. There is no dissection, occlusion or flow-limiting stenosis to the skull base (V1-V3 segments). CTA HEAD FINDINGS POSTERIOR CIRCULATION: The left vertebral artery terminates in PICA. There is severe stenosis of the right vertebral artery at the vertebrobasilar junction. Short segment occlusion at the origin of the right AICA. Basilar artery is normal. Superior cerebellar arteries are normal. Posterior cerebral arteries are normal. ANTERIOR CIRCULATION: Intracranial internal carotid arteries are normal. Anterior cerebral arteries are normal. 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 Mild stenosis of the right MCA M1 segment. Venous sinuses: As permitted by contrast timing, patent. Anatomic variants: Fetal origin of the left posterior cerebral artery. Review of the MIP images confirms the above findings. IMPRESSION: 1. 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 2. 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 3. Short segment occlusion at the origin of the right AICA. Electronically Signed   By: Deatra Robinson M.D.   On: 01/04/2024 01:46   MR Brain W and Wo Contrast Result Date: 01/03/2024 CLINICAL DATA:  Acute neurologic deficit.  Dizziness for 2 days. EXAM: MRI HEAD WITHOUT AND WITH CONTRAST TECHNIQUE: Multiplanar, multiecho pulse sequences of the brain and surrounding structures were obtained without and with intravenous contrast. CONTRAST:  7mL GADAVIST GADOBUTROL 1 MMOL/ML IV SOLN COMPARISON:  None Available. FINDINGS: Brain: There are multiple punctate foci of abnormal diffusion restriction within the right cerebellar hemisphere and right occipital lobe. No acute or chronic hemorrhage. Normal white matter signal, parenchymal volume and CSF spaces. The midline  structures are normal. There is no abnormal contrast enhancement. Vascular: Normal flow voids. Skull and upper cervical spine: Normal calvarium and skull base. Visualized upper cervical spine and soft tissues are normal. Sinuses/Orbits:No paranasal sinus fluid levels or advanced mucosal thickening. No mastoid or middle ear effusion. Normal orbits. IMPRESSION: Multiple punctate acute infarcts within the right cerebellar hemisphere and right occipital lobe. No hemorrhage or mass effect. Electronically Signed   By: Deatra Robinson M.D.   On: 01/03/2024 22:33   Scheduled Meds:  aspirin EC  81 mg Oral Daily   atorvastatin  80 mg Oral QHS   clopidogrel  75 mg Oral Daily   enoxaparin (LOVENOX) injection  40 mg Subcutaneous Q24H   Continuous Infusions:   LOS: 0 days   Marguerita Merles, DO [facility] Available via Epic secure chat 7am-7pm After these hours, please refer to coverage provider listed on [email or web address] 01/05/2024, 3:46 PM

## 2024-01-05 NOTE — Plan of Care (Signed)

## 2024-01-05 NOTE — Plan of Care (Signed)
  Problem: Education: Goal: Knowledge of disease or condition will improve Outcome: Progressing   Problem: Ischemic Stroke/TIA Tissue Perfusion: Goal: Complications of ischemic stroke/TIA will be minimized Outcome: Progressing   Problem: Coping: Goal: Will identify appropriate support needs Outcome: Progressing   Problem: Health Behavior/Discharge Planning: Goal: Goals will be collaboratively established with patient/family Outcome: Progressing   Problem: Self-Care: Goal: Ability to communicate needs accurately will improve Outcome: Progressing   Problem: Education: Goal: Knowledge of General Education information will improve Description: Including pain rating scale, medication(s)/side effects and non-pharmacologic comfort measures Outcome: Progressing   Problem: Clinical Measurements: Goal: Ability to maintain clinical measurements within normal limits will improve Outcome: Progressing Goal: Will remain free from infection Outcome: Progressing Goal: Respiratory complications will improve Outcome: Progressing   Problem: Pain Managment: Goal: General experience of comfort will improve and/or be controlled Outcome: Progressing   Problem: Skin Integrity: Goal: Risk for impaired skin integrity will decrease Outcome: Progressing

## 2024-01-05 NOTE — Progress Notes (Addendum)
 Transition of Care [facility] - Inpatient Brief Assessment   Patient Details  Name: Melvin Mills MRN: [id number] Date of Birth: September 30, 1971  Transition of Care [facility]) CM/SW Contact:    Ronny Bacon, RN Phone Number: 01/05/2024, 10:55 AM   Clinical Narrative: Patient from home with c/o dizziness, nausea and vomiting. MRI showed stroke. PT/OT signed off, no recommendations noted. No TOC needs at this time.   Transition of Care Asessment: Insurance and Status: (P) Insurance coverage has been reviewed Patient has primary care physician: (P) Yes Home environment has been reviewed: (P) House Prior level of function:: (P) Independent Prior/Current Home Services: (P) No current home services Social Drivers of Health Review: (P) SDOH reviewed no interventions necessary Readmission risk has been reviewed: (P) Yes Transition of care needs: (P) no transition of care needs at this time

## 2024-01-05 NOTE — Evaluation (Signed)
 Physical Therapy Evaluation and Discharge Patient Details Name: Melvin Mills MRN: [id number] DOB: Dec 31, 1970 Today's Date: 01/05/2024  History of Present Illness  Melvin Mills is a 53 y.o. male presenting with dizziness, nausea, and vomiting. 01/03/2024 MRI of brain reveals multiple punctate acute infarcts within right cerebellar hemisphere and right occipital lobe. PMHx includes vertigo  Clinical Impression   Patient evaluated by Physical Therapy with no further acute PT needs identified. Patient denied dizziness and without imbalance throughout evaluation. Patient walking independently, including with head turns.  PT is signing off. Thank you for this referral.         If plan is discharge home, recommend the following:     Can travel by private vehicle        Equipment Recommendations None recommended by PT  Recommendations for Other Services       Functional Status Assessment Patient has not had a recent decline in their functional status     Precautions / Restrictions Precautions Precautions: None      Mobility  Bed Mobility Overal bed mobility: Independent                  Transfers Overall transfer level: Independent Equipment used: None                    Ambulation/Gait Ambulation/Gait assistance: Independent Gait Distance (Feet): 200 Feet Assistive device: None Gait Pattern/deviations: WFL(Within Functional Limits)   Gait velocity interpretation: >2.62 ft/sec, indicative of community ambulatory   General Gait Details: including head turns  Careers information officer     Tilt Bed    Modified Rankin (Stroke Patients Only) Modified Rankin (Stroke Patients Only) Pre-Morbid Rankin Score: No symptoms Modified Rankin: No symptoms     Balance Overall balance assessment: Independent                                           Pertinent Vitals/Pain Pain Assessment Pain Assessment: No/denies pain     Home Living Family/patient expects to be discharged to:: Private residence Living Arrangements: Spouse/significant other;Children Available Help at Discharge: Family Type of Home: House Home Access: Level entry       Home Layout: One level Home Equipment: None      Prior Function Prior Level of Function : Independent/Modified Independent;Driving;Working/employed             Mobility Comments: quality control for welding       Extremity/Trunk Assessment   Upper Extremity Assessment Upper Extremity Assessment: Defer to OT evaluation    Lower Extremity Assessment Lower Extremity Assessment: Overall WFL for tasks assessed    Cervical / Trunk Assessment Cervical / Trunk Assessment: Normal  Communication   Communication Communication: Other (comment);No apparent difficulties (speaks [facility] interpreter utilized) Factors Affecting Communication: Non - English speaking, interpreter not available    Cognition Arousal: Alert Behavior During Therapy: WFL for tasks assessed/performed   PT - Cognitive impairments: No apparent impairments                         Following commands: Intact       Cueing Cueing Techniques: Verbal cues     General Comments      Exercises     Assessment/Plan    PT Assessment Patient does not need any further  PT services  PT Problem List         PT Treatment Interventions      PT Goals (Current goals can be found in the Care Plan section)  Acute Rehab PT Goals PT Goal Formulation: All assessment and education complete, DC therapy    Frequency       Co-evaluation               AM-PAC PT "6 Clicks" Mobility  Outcome Measure Help needed turning from your back to your side while in a flat bed without using bedrails?: None Help needed moving from lying on your back to sitting on the side of a flat bed without using bedrails?: None Help needed moving to and from a bed to a chair (including a  wheelchair)?: None Help needed standing up from a chair using your arms (e.g., wheelchair or bedside chair)?: None Help needed to walk in hospital room?: None Help needed climbing 3-5 steps with a railing? : None 6 Click Score: 24    End of Session Equipment Utilized During Treatment: Gait belt Activity Tolerance: Patient tolerated treatment well Patient left: in chair;with call bell/phone within reach;with chair alarm set Nurse Communication: Mobility status;Other (comment) (no PT or DME needs) PT Visit Diagnosis: Other symptoms and signs involving the nervous system (R29.898)    Time: 4098-1191 PT Time Calculation (min) (ACUTE ONLY): 18 min   Charges:   PT Evaluation $PT Eval Low Complexity: 1 Low   PT General Charges $$ ACUTE PT VISIT: 1 Visit          Melvin Mills, PT Acute Rehabilitation Services  Office [phone]   Melvin Mills 01/05/2024, 9:13 AM

## 2024-01-05 NOTE — Progress Notes (Signed)
 OT Cancellation Note  Patient Details Name: Melvin Mills MRN: [id number] DOB: 07-Oct-1971   Cancelled Treatment:    Reason Eval/Treat Not Completed: OT screened, no needs identified, will sign off (Per PT, pt does not have acute OT needs. Wil sign off, thank you.)  Donia Pounds 01/05/2024, 9:19 AM

## 2024-01-05 NOTE — Progress Notes (Signed)
   [facility] has been requested to perform a transesophageal echocardiogram on Melvin Mills for CVA.  After careful review of history and examination, the risks and benefits of transesophageal echocardiogram have been explained including risks of esophageal damage, perforation (1:10,000 risk), bleeding, pharyngeal hematoma as well as other potential complications associated with anesthesia including aspiration, arrhythmia, respiratory failure and death. Alternatives to treatment were discussed, questions were answered. Patient is willing to proceed.   Patient is a 53 year old [name] male who had a solitary episode of A-fib in the past but no recurrence who presented with acute CVA.  MRI consistent with multiple acute punctate infarct.  Blood pressure is normal.  No significant anemia.  Platelet normal.  Echocardiogram showed normal EF.  [name] Nurse, learning disability used during interview, Emergency planning/management officer [phone].  Patient is agreeable to proceed.  [place], [place] 01/05/2024 2:59 PM

## 2024-01-06 ENCOUNTER — Other Ambulatory Visit (HOSPITAL_COMMUNITY): Payer: Self-pay

## 2024-01-06 ENCOUNTER — Encounter (HOSPITAL_COMMUNITY)
Admission: EM | Disposition: A | Discharge status: Home / Self Care | Payer: Self-pay | Source: Home / Self Care | Attending: Emergency Medicine

## 2024-01-06 ENCOUNTER — Observation Stay (HOSPITAL_COMMUNITY): Payer: Self-pay | Admitting: Anesthesiology

## 2024-01-06 ENCOUNTER — Observation Stay (HOSPITAL_BASED_OUTPATIENT_CLINIC_OR_DEPARTMENT_OTHER): Payer: BC Managed Care – PPO

## 2024-01-06 ENCOUNTER — Encounter (HOSPITAL_COMMUNITY): Payer: Self-pay | Admitting: Family Medicine

## 2024-01-06 DIAGNOSIS — R9431 Abnormal electrocardiogram [ECG] [EKG]: Secondary | ICD-10-CM

## 2024-01-06 DIAGNOSIS — Z8679 Personal history of other diseases of the circulatory system: Secondary | ICD-10-CM

## 2024-01-06 DIAGNOSIS — I639 Cerebral infarction, unspecified: Secondary | ICD-10-CM

## 2024-01-06 DIAGNOSIS — R42 Dizziness and giddiness: Secondary | ICD-10-CM | POA: Diagnosis not present

## 2024-01-06 DIAGNOSIS — E8809 Other disorders of plasma-protein metabolism, not elsewhere classified: Secondary | ICD-10-CM

## 2024-01-06 DIAGNOSIS — R739 Hyperglycemia, unspecified: Secondary | ICD-10-CM

## 2024-01-06 DIAGNOSIS — E785 Hyperlipidemia, unspecified: Secondary | ICD-10-CM | POA: Diagnosis not present

## 2024-01-06 HISTORY — DX: Cerebral infarction, unspecified: I63.9

## 2024-01-06 HISTORY — PX: TRANSESOPHAGEAL ECHOCARDIOGRAM (CATH LAB): EP1270

## 2024-01-06 LAB — CBC WITH DIFFERENTIAL/PLATELET
Abs Immature Granulocytes: 0.09 10*3/uL — ABNORMAL HIGH (ref 0.00–0.07)
Basophils Absolute: 0 10*3/uL (ref 0.0–0.1)
Basophils Relative: 0 %
Eosinophils Absolute: 1 10*3/uL — ABNORMAL HIGH (ref 0.0–0.5)
Eosinophils Relative: 14 %
HCT: 49.2 % (ref 39.0–52.0)
Hemoglobin: 15.9 g/dL (ref 13.0–17.0)
Immature Granulocytes: 1 %
Lymphocytes Relative: 27 %
Lymphs Abs: 2 10*3/uL (ref 0.7–4.0)
MCH: 27.1 pg (ref 26.0–34.0)
MCHC: 32.3 g/dL (ref 30.0–36.0)
MCV: 84 fL (ref 80.0–100.0)
Monocytes Absolute: 0.4 10*3/uL (ref 0.1–1.0)
Monocytes Relative: 6 %
Neutro Abs: 3.7 10*3/uL (ref 1.7–7.7)
Neutrophils Relative %: 52 %
Platelets: 165 10*3/uL (ref 150–400)
RBC: 5.86 MIL/uL — ABNORMAL HIGH (ref 4.22–5.81)
RDW: 13.7 % (ref 11.5–15.5)
WBC: 7.2 10*3/uL (ref 4.0–10.5)
nRBC: 0 % (ref 0.0–0.2)

## 2024-01-06 LAB — BETA-2-GLYCOPROTEIN I ABS, IGG/M/A
Beta-2 Glyco I IgG: <9 GPI IgG units (ref 0–20)
Beta-2-Glycoprotein I IgA: <9 GPI IgA units (ref 0–25)
Beta-2-Glycoprotein I IgM: <9 GPI IgM units (ref 0–32)

## 2024-01-06 LAB — SURGICAL PCR SCREEN
MRSA, PCR: NEGATIVE
Staphylococcus aureus: NEGATIVE

## 2024-01-06 LAB — COMPREHENSIVE METABOLIC PANEL
ALT: 19 U/L (ref 0–44)
AST: 25 U/L (ref 15–41)
Albumin: 3.6 g/dL (ref 3.5–5.0)
Alkaline Phosphatase: 60 U/L (ref 38–126)
Anion gap: 12 (ref 5–15)
BUN: 19 mg/dL (ref 6–20)
CO2: 20 mmol/L — ABNORMAL LOW (ref 22–32)
Calcium: 9.1 mg/dL (ref 8.9–10.3)
Chloride: 106 mmol/L (ref 98–111)
Creatinine, Ser: 1.23 mg/dL (ref 0.61–1.24)
GFR, Estimated: >60 mL/min (ref >60)
Glucose, Bld: 155 mg/dL — ABNORMAL HIGH (ref 70–99)
Potassium: 3.8 mmol/L (ref 3.5–5.1)
Sodium: 138 mmol/L (ref 135–145)
Total Bilirubin: 0.4 mg/dL (ref 0.0–1.2)
Total Protein: 7.4 g/dL (ref 6.5–8.1)

## 2024-01-06 LAB — PROTEIN C ACTIVITY: Protein C Activity: 90 % (ref 73–180)

## 2024-01-06 LAB — LUPUS ANTICOAGULANT PANEL
DRVVT: 40.7 s (ref 0.0–47.0)
PTT Lupus Anticoagulant: 37.6 s (ref 0.0–43.5)

## 2024-01-06 LAB — PROTEIN S ACTIVITY: Protein S Activity: 90 % (ref 63–140)

## 2024-01-06 LAB — CARDIOLIPIN ANTIBODIES, IGG, IGM, IGA
Anticardiolipin IgA: <9 U/mL (ref 0–11)
Anticardiolipin IgG: <9 GPL U/mL (ref 0–14)
Anticardiolipin IgM: <9 [MPL'U]/mL (ref 0–12)

## 2024-01-06 LAB — PHOSPHORUS: Phosphorus: 3.3 mg/dL (ref 2.5–4.6)

## 2024-01-06 LAB — ECHO TEE

## 2024-01-06 LAB — MAGNESIUM: Magnesium: 2.1 mg/dL (ref 1.7–2.4)

## 2024-01-06 LAB — PROTEIN S, TOTAL: Protein S Ag, Total: 74 % (ref 60–150)

## 2024-01-06 SURGERY — TRANSESOPHAGEAL ECHOCARDIOGRAM (TEE) (CATHLAB)
Anesthesia: Monitor Anesthesia Care

## 2024-01-06 MED ORDER — ASPIRIN 81 MG PO TBEC
81.0000 mg | DELAYED_RELEASE_TABLET | Freq: Every day | ORAL | 12 refills | Status: AC
  Filled 2024-01-06: qty 30, 30d supply, fill #0

## 2024-01-06 MED ORDER — ACETAMINOPHEN 325 MG PO TABS
650.0000 mg | ORAL_TABLET | ORAL | 0 refills | Status: AC | PRN
  Filled 2024-01-06: qty 20, 2d supply, fill #0

## 2024-01-06 MED ORDER — PROPOFOL 10 MG/ML IV BOLUS
INTRAVENOUS | Status: DC | PRN
  Administered 2024-01-06: 80 mg via INTRAVENOUS

## 2024-01-06 MED ORDER — MECLIZINE HCL 25 MG PO TABS
25.0000 mg | ORAL_TABLET | Freq: Three times a day (TID) | ORAL | 0 refills | Status: AC | PRN
  Filled 2024-01-06: qty 30, 10d supply, fill #0

## 2024-01-06 MED ORDER — LIDOCAINE 2% (20 MG/ML) 5 ML SYRINGE
INTRAMUSCULAR | Status: DC | PRN
  Administered 2024-01-06: 40 mg via INTRAVENOUS

## 2024-01-06 MED ORDER — SENNOSIDES-DOCUSATE SODIUM 8.6-50 MG PO TABS
1.0000 | ORAL_TABLET | Freq: Every evening | ORAL | 0 refills | Status: AC | PRN
  Filled 2024-01-06: qty 30, 30d supply, fill #0

## 2024-01-06 MED ORDER — CLOPIDOGREL BISULFATE 75 MG PO TABS
75.0000 mg | ORAL_TABLET | Freq: Every day | ORAL | 2 refills | Status: AC
Start: 2024-01-07 — End: 2024-04-06
  Filled 2024-01-06: qty 30, 30d supply, fill #0

## 2024-01-06 MED ORDER — MUPIROCIN 2 % EX OINT: 1.0000 | TOPICAL_OINTMENT | Freq: Two times a day (BID) | CUTANEOUS | Status: DC

## 2024-01-06 MED ORDER — PROPOFOL 500 MG/50ML IV EMUL
INTRAVENOUS | Status: DC | PRN
  Administered 2024-01-06: 75 ug/kg/min via INTRAVENOUS

## 2024-01-06 MED ORDER — SODIUM CHLORIDE 0.9 % IV SOLN: INTRAVENOUS | Status: DC

## 2024-01-06 MED ORDER — ATORVASTATIN CALCIUM 80 MG PO TABS
80.0000 mg | ORAL_TABLET | Freq: Every day | ORAL | 0 refills | Status: AC
  Filled 2024-01-06: qty 30, 30d supply, fill #0

## 2024-01-06 MED ORDER — PHENYLEPHRINE HCL-NACL 20-0.9 MG/250ML-% IV SOLN
INTRAVENOUS | Status: DC | PRN
  Administered 2024-01-06: 20 ug/min via INTRAVENOUS

## 2024-01-06 NOTE — Progress Notes (Signed)
  Discussed loop recorder in depth with patient, half with video translator and then transitioned to family via phone.   Ultimately, pt refuses loop recorder as he does not want anything to "stay inside him" but also adds that he does not have current active insurance, so is not a candidate.   Melvin Mills [address] [place], [place]  01/06/2024 9:43 AM

## 2024-01-06 NOTE — Transfer of Care (Signed)
 Immediate Anesthesia Transfer of Care Note  Patient: Melvin Mills  Procedure(s) Performed: TRANSESOPHAGEAL ECHOCARDIOGRAM  Patient Location: PACU  Anesthesia Type:MAC  Level of Consciousness: sedated  Airway & Oxygen Therapy: Patient Spontanous Breathing  Post-op Assessment: Report given to RN  Post vital signs: Reviewed and stable  Last Vitals:  Vitals Value Taken Time  BP 117/84 01/06/24 1024  Temp    Pulse 60 01/06/24 1024  Resp 16 01/06/24 1024  SpO2 98 % 01/06/24 1024    Last Pain:  Vitals:   01/06/24 0903  TempSrc:   PainSc: 0-No pain         Complications: No notable events documented.

## 2024-01-06 NOTE — Progress Notes (Addendum)
 STROKE TEAM PROGRESS NOTE   INTERIM HISTORY/SUBJECTIVE  Daughter at the bedside.  She assisted with translation TEE thrombus-need to follow-up with cardiology outpatient Patient refused to have loop recorder placed  Patient is laying in the bed in no apparent distress on his phone.  New neurological events overnight. Patient is anxiously waiting to go home  OBJECTIVE  CBC    Component Value Date/Time   WBC 6.8 01/05/2024 0649   RBC 5.33 01/05/2024 0649   HGB 14.8 01/05/2024 0649   HCT 44.6 01/05/2024 0649   PLT 153 01/05/2024 0649   MCV 83.7 01/05/2024 0649   MCH 27.8 01/05/2024 0649   MCHC 33.2 01/05/2024 0649   RDW 13.8 01/05/2024 0649   LYMPHSABS 1.8 01/05/2024 0649   MONOABS 0.4 01/05/2024 0649   EOSABS 1.4 (H) 01/05/2024 0649   BASOSABS 0.0 01/05/2024 0649    BMET    Component Value Date/Time   NA 138 01/05/2024 0649   K 3.7 01/05/2024 0649   CL 108 01/05/2024 0649   CO2 23 01/05/2024 0649   GLUCOSE 91 01/05/2024 0649   BUN 14 01/05/2024 0649   CREATININE 1.24 01/05/2024 0649   CALCIUM 8.9 01/05/2024 0649   GFRNONAA >60 01/05/2024 0649    IMAGING past 24 hours EP STUDY Result Date: 01/06/2024 See surgical note for result.  VAS [place] LOWER EXTREMITY VENOUS (DVT) Result Date: 01/05/2024  Lower Venous DVT Study Patient Name:  Melvin Mills  Date of Exam:   01/05/2024 Medical Rec #: [id number]       Accession #:    [id number] Date of Birth: Nov 01, 1971        Patient Gender: M Patient Age:   53 years Exam Location:  [facility] Procedure:      VAS [place] LOWER EXTREMITY VENOUS (DVT) Referring Phys: Leticia Penna --------------------------------------------------------------------------------  Indications: Stroke.  Risk Factors: None identified. Comparison Study: None. Performing Technologist: Shona Simpson  Examination Guidelines: A complete evaluation includes B-mode imaging, spectral Doppler, color Doppler, and power Doppler as needed of all accessible portions of  each vessel. Bilateral testing is considered an integral part of a complete examination. Limited examinations for reoccurring indications may be performed as noted. The reflux portion of the exam is performed with the patient in reverse Trendelenburg.  +---------+---------------+---------+-----------+----------+--------------+ RIGHT    CompressibilityPhasicitySpontaneityPropertiesThrombus Aging +---------+---------------+---------+-----------+----------+--------------+ CFV      Full           Yes      Yes                                 +---------+---------------+---------+-----------+----------+--------------+ SFJ      Full                                                        +---------+---------------+---------+-----------+----------+--------------+ FV Prox  Full                                                        +---------+---------------+---------+-----------+----------+--------------+ FV Mid   Full                                                        +---------+---------------+---------+-----------+----------+--------------+  FV DistalFull                                                        +---------+---------------+---------+-----------+----------+--------------+ PFV      Full                                                        +---------+---------------+---------+-----------+----------+--------------+ POP      Full           Yes      Yes                                 +---------+---------------+---------+-----------+----------+--------------+ PTV      Full                                                        +---------+---------------+---------+-----------+----------+--------------+ PERO     Full                                                        +---------+---------------+---------+-----------+----------+--------------+   +---------+---------------+---------+-----------+----------+--------------+ LEFT      CompressibilityPhasicitySpontaneityPropertiesThrombus Aging +---------+---------------+---------+-----------+----------+--------------+ CFV      Full           Yes      Yes                                 +---------+---------------+---------+-----------+----------+--------------+ SFJ      Full                                                        +---------+---------------+---------+-----------+----------+--------------+ FV Prox  Full                                                        +---------+---------------+---------+-----------+----------+--------------+ FV Mid   Full                                                        +---------+---------------+---------+-----------+----------+--------------+ FV DistalFull                                                        +---------+---------------+---------+-----------+----------+--------------+  PFV      Full                                                        +---------+---------------+---------+-----------+----------+--------------+ POP      Full           Yes      Yes                                 +---------+---------------+---------+-----------+----------+--------------+ PTV      Full                                                        +---------+---------------+---------+-----------+----------+--------------+ PERO     Full                                                        +---------+---------------+---------+-----------+----------+--------------+     Summary: BILATERAL: - No evidence of deep vein thrombosis seen in the lower extremities, bilaterally. -No evidence of popliteal cyst, bilaterally.   *See table(s) above for measurements and observations. Electronically signed by Gerarda Fraction on 01/05/2024 at 3:41:11 PM.    Final     Vitals:   01/06/24 1026 01/06/24 1052 01/06/24 1055 01/06/24 1114  BP: (!) 129/97 (!) 154/103 (!) 165/95 (!) 159/95  Pulse: 60 61 (!) 54 (!) 50   Resp: 14 13 13 16   Temp:    98 F (36.7 C)  TempSrc:    Oral  SpO2: 100% 98% 98% 100%  Weight:    79.7 kg  Height:         PHYSICAL EXAM General:  Alert, well-nourished, well-developed patient in no acute distress Psych:  Mood and affect appropriate for situation CV: Regular rate and rhythm on monitor Respiratory:  Regular, unlabored respirations on room air GI: Abdomen soft and nontender   NEURO:  Mental Status: AA&Ox3, patient is able to give clear and coherent history Speech/Language: speech is without dysarthria or aphasia.  Naming, repetition, fluency, and comprehension intact.  Cranial Nerves:  II: PERRL. Visual fields full.  III, IV, VI: EOMI. Eyelids elevate symmetrically.  V: Sensation is intact to light touch and symmetrical to face.  VII: Face is symmetrical resting and smiling VIII: hearing intact to voice. IX, X: Palate elevates symmetrically. Phonation is normal.  MV:HQIONGEX shrug 5/5. XII: tongue is midline without fasciculations. Motor: 5/5 strength to all muscle groups tested.  Tone: is normal and bulk is normal Sensation- Intact to light touch bilaterally. Extinction absent to light touch to DSS.   Coordination: FTN intact bilaterally, HKS: no ataxia in BLE.No drift.  Gait- deferred  Most Recent NIH: 0     ASSESSMENT/PLAN  Melvin Mills is a 53 y.o. male with unknown past medical history admitted with multifocal posterior circulation strokes.  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 TEE EF 60-65%. NO PFO. 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  Loop refused by patient  Hypercoagulable panel pending LE [place] negative LDL 154 HgbA1c 5.6 VTE prophylaxis - lovenox No antithrombotic prior to admission, now on aspirin 81 mg daily and clopidogrel 75 mg daily for 3 months then aspirin alone Outpatient neurology follow-up in 8 weeks after discharge Therapy recommendations:  Pending Disposition:  pending  History of A-fib  One-time event during illness in 2020- CHA2DS2 VASc score is 0 at that time no anticoagulation was started  Hypertension Home meds:  none Stable BP goal normotensive, as symptoms are outside of permissive hypertension window.   Hyperlipidemia Home meds:  none LDL 154, goal < 70 Add Lipitor 80 mg Continue statin at discharge     Pt seen by Neuro NP/APP and later by MD. Note/plan to be edited by MD as needed.     Gevena Mart DNP, ACNPC-AG  [facility]  ATTENDING ATTESTATION:  53 year old with posterior circulation strokes.  TEE is abnormal with very small interatrial shunt several myxomatous valves but no thrombus.  No clear cardioembolic indication at this time.  Patient refused loop monitor.  Can consider Zio patch.  Will continue to treat with DAPT therapy.  Discussed with patient and family arms in the room.  He can follow-up cardiology outpatient for his abnormal echo.  Diet exercise and stroke precautions discussed.  Follow-up in stroke clinic in 8 weeks.  Neurology sign off please call with questions.  Discussed with Dr. Marland Mcalpine and Cardiology   Dr. Viviann Spare evaluated pt independently, reviewed imaging, chart, labs. Discussed and formulated plan with the Resident/APP. Changes were made to the note where appropriate. Please see APP/resident note above for details.   Total 36 minutes spent on counseling patient and coordinating care, writing notes and reviewing chart.    Jessyka Austria,MD      To contact Stroke Continuity  provider, please refer to [email or web address]. After hours, contact General Neurology

## 2024-01-06 NOTE — TOC Transition Note (Signed)
 Transition of Care [facility]) - Discharge Note   Patient Details  Name: Melvin Mills MRN: [id number] Date of Birth: November 18, 1970  [facility] CM/SW Contact:  Kermit Balo, RN Phone Number: 01/06/2024, 3:48 PM   Clinical Narrative:     Pt is discharging home with self care. No follow up per PT.  Pt has transportation home.  Final next level of care: Home/Self Care Barriers to Discharge: No Barriers Identified   Patient Goals and CMS Choice            Discharge Placement                       Discharge Plan and Services Additional resources added to the After Visit Summary for                                       Social Drivers of Health (SDOH) Interventions SDOH Screenings   Food Insecurity: No Food Insecurity (01/06/2024)  Housing: Low Risk  (01/06/2024)  Transportation Needs: No Transportation Needs (01/06/2024)  Utilities: Not At Risk (01/06/2024)  Depression (PHQ2-9): Low Risk  (04/19/2019)  Tobacco Use: Low Risk  (01/06/2024)     Readmission Risk Interventions     No data to display

## 2024-01-06 NOTE — Anesthesia Postprocedure Evaluation (Signed)
 Anesthesia Post Note  Patient: Melvin Mills  Procedure(s) Performed: TRANSESOPHAGEAL ECHOCARDIOGRAM     Patient location during evaluation: PACU Anesthesia Type: MAC Level of consciousness: awake and alert Pain management: pain level controlled Vital Signs Assessment: post-procedure vital signs reviewed and stable Respiratory status: spontaneous breathing, nonlabored ventilation, respiratory function stable and patient connected to nasal cannula oxygen Cardiovascular status: stable and blood pressure returned to baseline Postop Assessment: no apparent nausea or vomiting Anesthetic complications: no  No notable events documented.  Last Vitals:  Vitals:   01/06/24 1055 01/06/24 1114  BP: (!) 165/95 (!) 159/95  Pulse: (!) 54 (!) 50  Resp: 13 16  Temp:  36.7 C  SpO2: 98% 100%    Last Pain:  Vitals:   01/06/24 1200  TempSrc:   PainSc: 0-No pain                 Shelton Silvas

## 2024-01-06 NOTE — Progress Notes (Signed)
 Patient A/Ox4 and stable at time of transport to TEE. Consent signed.

## 2024-01-06 NOTE — CV Procedure (Signed)
    TRANSESOPHAGEAL ECHOCARDIOGRAM   NAME:  Melvin Mills   MRN: [id number] DOB:  Jan 16, 1971   ADMIT DATE: 01/03/2024  INDICATIONS: Embolic CVA  PROCEDURE:   Informed consent was obtained prior to the procedure. The risks, benefits and alternatives for the procedure were discussed and the patient comprehended these risks.  Risks include, but are not limited to, cough, sore throat, vomiting, nausea, somnolence, esophageal and stomach trauma or perforation, bleeding, low blood pressure, aspiration, pneumonia, infection, trauma to the teeth and death.    Procedural time out performed.   Patient received monitored anesthesia care under the supervision of Dr. Hart [place]. Patient received a total of 259.26 mg propofol during the procedure.  The transesophageal probe was inserted in the esophagus and stomach without difficulty and multiple views were obtained. He received 40 mg lidocaine and 620 mcg phenylephrine as well.   COMPLICATIONS:    There were no immediate complications.  FINDINGS:  LEFT VENTRICLE: EF = 60-65%. No regional wall motion abnormalities.  RIGHT VENTRICLE: Normal size and function.   LEFT ATRIUM: No thrombus/mass.  LEFT ATRIAL APPENDAGE: No thrombus/mass.   RIGHT ATRIUM: Eustachian valve and Chiari network present  AORTIC VALVE:  Trileaflet. Mild regurgitation. There is very trivial degenerative/myxomatous appearing structure   MITRAL VALVE:    Abnormal structure--myxomatous appearing. Very thin filamentous strands seen extending from leaflet into left atrium (appears likely both posterior and anterior leaflets, though connection to anterior leaflet not seen). Trivial regurgitation. Redundant chordae seen.  TRICUSPID VALVE: Abnormal structure--thickened and slightly myxomatous appearing. Mild regurgitation.   PULMONIC VALVE: Grossly normal structure. Trivial regurgitation. No apparent vegetation.  INTERATRIAL SEPTUM: No PFO or ASD seen by color Doppler. Bubble study  very faintly positive (only 1-2 bubbles seen) at rest and with abdominal pressure  PERICARDIUM: Trivial effusion noted.  DESCENDING AORTA: No plaque seen   CONCLUSION: Myxomatous appearance of mitral, aortic, and tricuspid valves as noted above. Very thin filaments noted in left atrial attached to mitral valve. Very minimal left to right intra-atrial shunt noted with bubble study.   Jodelle Red, MD, PhD, [facility] [place]  [facility]  [place]  Heart & Vascular at [facility] [address], [address] [place], [place] [address] [phone]   10:20 AM

## 2024-01-06 NOTE — Anesthesia Preprocedure Evaluation (Addendum)
 Anesthesia Evaluation  Patient identified by MRN, date of birth, ID band Patient awake    Reviewed: Allergy & Precautions, NPO status , Patient's Chart, lab work & pertinent test results  Airway Mallampati: III  TM Distance: >3 FB Neck ROM: Full    Dental  (+) Teeth Intact, Dental Advisory Given   Pulmonary neg pulmonary ROS   breath sounds clear to auscultation       Cardiovascular negative cardio ROS  Rhythm:Regular Rate:Normal     Neuro/Psych  Headaches CVA  negative psych ROS   GI/Hepatic negative GI ROS, Neg liver ROS,,,  Endo/Other  negative endocrine ROS    Renal/GU Renal disease     Musculoskeletal negative musculoskeletal ROS (+)    Abdominal   Peds  Hematology negative hematology ROS (+)   Anesthesia Other Findings   Reproductive/Obstetrics                             Anesthesia Physical Anesthesia Plan  ASA: 3  Anesthesia Plan: MAC   Post-op Pain Management: Minimal or no pain anticipated   Induction: Intravenous  PONV Risk Score and Plan: 0 and Propofol infusion  Airway Management Planned: Natural Airway and Nasal Cannula  Additional Equipment:   Intra-op Plan:   Post-operative Plan:   Informed Consent: I have reviewed the patients History and Physical, chart, labs and discussed the procedure including the risks, benefits and alternatives for the proposed anesthesia with the patient or authorized representative who has indicated his/her understanding and acceptance.     Interpreter used for interview  Plan Discussed with: CRNA  Anesthesia Plan Comments:        Anesthesia Quick Evaluation

## 2024-01-06 NOTE — Discharge Summary (Signed)
 Physician Discharge Summary   Patient: Melvin Mills MRN: [id number] DOB: May 26, 1971  Admit date:     01/03/2024  Discharge date: 01/06/24  Discharge Physician: Marguerita Merles, DO   PCP: Grayce Sessions, NP   Recommendations at discharge:   Follow-up with PCP within 1 to 2 weeks and repeat CBC, CMP, mag, Phos within 1 week Continue dual antiplatelet therapy for 3 months and then follow-up with neurology within 8 weeks of discharge Follow-up with cardiology outpatient for abnormal echo and unfortunately cannot be given a Zio patch or Holter monitor given lack of discharge.  Discharge Diagnoses: Principal Problem:   Ischemic stroke [name]) Active Problems:   Lactic acidosis   Headache   Dizziness   History of atrial fibrillation   Renal insufficiency   Hyperglycemia   Leukocytosis   HLD (hyperlipidemia)   Hypoalbuminemia   Cerebrovascular accident (CVA) (HCC)  Resolved Problems:   * No resolved hospital problems. [facility] Course: Patient is a 53 year old [name] speaking male with no real past medical history who presented with dizziness, nausea or vomiting.  He described it as room spinning and had a mild headache which persisted so it prompted him to get further evaluation.  Workup was initiated and he underwent a brain MRI which revealed multiple punctate acute infarcts within the right cerebellar hemisphere and right occipital lobe. Neurology was consulted and recommended stroke workup as the suspected embolic etiology. He was given IV fluids, meclizine and Valium and has completed the majority of the stroke workup but they are recommending TEE and possible loop implantation with EP and he is undergoing a hypercoagulable workup.  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  Patient declined his loop implantation and he is medically stable for discharge at this time neurology follow-up with PCP and  cardiology and unfortunately cannot get a Zio patch due to lack of insurance but will be following up with their clinic at discharge.  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 Short segment occlusion at the origin of the right AICA." -MRI Brain done and showed "Multiple punctate acute infarcts within the right cerebellar hemisphere and right occipital lobe. No hemorrhage or mass effect." -Check ECHOCardiogram and showed LVEF of 60-65%, no RWMA  and normal LVDP with RVSF being normal -Lipid Panel as below; HbA1c was 5.6 -IVF now stopped; Continuing Meclizine 25 mg p.o. 3 times daily as needed for dizziness and then diazepam 2 mg p.o. every 12 as needed for refractory Vertigo -C/w Neurochecks per protocol and continue to Monitor on Telemetry -PT/OT/SLP to further Evaluate and Treat -Neurology has placed the patient on prophylactic therapy with dual antiplatelet therapy with aspirin 81 mg p.o. daily and clopidogrel 75 mg p.o. 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  There is also very minimal left to right intra-atrial shunt shunt noted with a bubble study.  Subsequently patient has declined loop recorder implantation -  Neurology recommends dual antiplatelet therapy for 3 months and then just aspirin alone with outpatient follow-up with neurology in 8 weeks.  Patient's hypercoagulable panel is still pending and will need follow-up on  outpatient setting and he will need to follow-up with cardiology outpatient setting for his abnormal echocardiogram findings.  Lactic Acidosis -Lactic Acid Level Trend went from 3.9 -> 2.0 -> 1.0 -Improved and s/p IVF Hydration  History of Atrial Fibrillation:Had a solitary episode of A-fib in 2020 with COVID illness with no recurrence.  TEE done and showed a trivial degenerative/myxomatous appearing structure in the aortic valve as well as abnormal structure and myxomatous appearance of the mitral valve very thin filamentous strands that were seen extending from the leaflet into the left atrium as well as trivial regurgitation and irregular cardiac seen.  There is also an abnormal structure and thickened slightly myxomatous valve appearing on the tricuspid and grossly normal pulmonic valve. -Given his myxomatous appearance of the mitral aortic and tricuspid valves neurology still elects to choose dual antiplatelet platelet therapy -Neurology recommended outpatient Zio patch however he is not a candidate and cannot perceive this given his lack of insurance  Renal Insufficiency, improving -BUN/Cr is now 19/1.23 at the time of discharge -Avoid Nephrotoxic Medications, Contrast Dyes, Hypotension and Dehydration to Ensure Adequate Renal Perfusion and will need to Renally Adjust Meds -Continue to Monitor and Trend Renal Function carefully and repeat CMP within 1 week  Hyperglycemia: -Likely reactive as HbA1c was 5.6 and Glucose Trend ranging from 91-154 on daily CMP  Leukocytosis: Resolved as WBC Trend is now 6.8. Continue to Monitor for S/Sx of Infection; Repeat CBC in AM if necessary  HLD: Patient's lipid panel done and showed a total cholesterol/HDL ratio of 5.9, cholesterol 212, HDL of 36, LDL 154, triglycerides of 161, VLDL 22; C/w Atorvastatin 80 mg po Daily given that his LDL goal is <70  Hypoalbuminemia: Trend ranging from 3.2-4.0; Will not repeat given  Consultants: Neurology, Cardiology  for TEE Procedures performed: As delineated as above  Disposition: Home Diet recommendation:  Discharge Diet Orders (From admission, onward)     Start     Ordered   01/06/24 0000  Diet - low sodium heart healthy        01/06/24 1431           Cardiac diet DISCHARGE MEDICATION: Allergies as of 01/06/2024   No Known Allergies      Medication List     TAKE these medications    acetaminophen 325 MG tablet Commonly known as: TYLENOL Take 2 tablets (650 mg total) by mouth every 4 (four) hours as needed for mild pain (pain score 1-3) (or temp > 37.5 C (99.5 F)).   aspirin EC 81 MG tablet Take 1 tablet (81 mg total) by mouth daily. Swallow whole. Start taking on: January 07, 2024   atorvastatin 80 MG tablet Commonly known as: LIPITOR U?ng 1 vin tr??c khi ng? (t?ng c?ng 80 mg). (Take 1 tablet (80 mg total) by mouth at bedtime.)   clopidogrel 75 MG tablet Commonly known as: PLAVIX Ngy u?ng 1 l?n, m?i l?n 1 vin (t?ng c?ng 75 mg). (Take 1 tablet (75 mg total) by mouth daily.) Start taking on: January 07, 2024   meclizine 25 MG tablet Commonly known as: ANTIVERT Take 1 tablet (25 mg total) by mouth 3 (three) times daily as needed for dizziness.   Senna-S 8.6-50 MG tablet Generic drug: senna-docusate Take 1 tablet by mouth at bedtime as needed for  mild constipation.       Discharge Exam: Filed Weights   01/06/24 0810 01/06/24 1114  Weight: 77.1 kg 79.7 kg   Vitals:   01/06/24 1055 01/06/24 1114  BP: (!) 165/95 (!) 159/95  Pulse: (!) 54 (!) 50  Resp: 13 16  Temp:  98 F (36.7 C)  SpO2: 98% 100%   Examination: Physical Exam:  Constitutional: WN/WD obese Caucasian male in no acute distress Respiratory: Diminished to auscultation bilaterally, no wheezing, rales, rhonchi or crackles. Normal respiratory effort and patient is not tachypenic. No accessory muscle use.  Unlabored breathing Cardiovascular: RRR, no murmurs / rubs / gallops. S1 and S2 auscultated.  No extremity edema Abdomen: Soft, non-tender, mildly distended secondary body habitus. Bowel sounds positive.  GU: Deferred. Musculoskeletal: No clubbing / cyanosis of digits/nails. No joint deformity upper and lower extremities.  Skin: No rashes, lesions, ulcers limited skin evaluation. No induration; Warm and dry.  Neurologic: CN 2-12 grossly intact with no focal deficits. Romberg sign and cerebellar reflexes not assessed.  Psychiatric: Normal judgment and insight. Alert and oriented x 3. Normal mood and appropriate affect.   Condition at discharge: stable  The results of significant diagnostics from this hospitalization (including imaging, microbiology, ancillary and laboratory) are listed below for reference.   Imaging Studies: ECHO TEE Result Date: 01/06/2024    TRANSESOPHOGEAL ECHO REPORT   Patient Name:   JVION TURGEON Date of Exam: 01/06/2024 Medical Rec #:  [id number]      Height:       63.0 in Accession #:    [id number]     Weight:       145.5 lb Date of Birth:  January 02, 1971       BSA:          1.689 m Patient Age:    52 years       BP:           139/80 mmHg Patient Gender: M              HR:           78 bpm. Exam Location:  Inpatient Procedure: 3D Echo, Transesophageal Echo, Cardiac Doppler and Color Doppler            (Both Spectral and Color Flow Doppler were utilized during            procedure). Indications:     Stroke  History:         Patient has prior history of Echocardiogram examinations, most                  recent 01/04/2024. Abnormal ECG, Stroke, Arrythmias:Atrial                  Fibrillation, Signs/Symptoms:Dizziness/Lightheadedness; Risk                  Factors:Dyslipidemia.  Sonographer:     Sheralyn Boatman RDCS Referring Phys:  [id number] HAO MENG Diagnosing Phys: Jodelle Red MD PROCEDURE: After discussion of the risks and benefits of a TEE, an informed consent was obtained from the patient. The transesophogeal probe was passed without difficulty through the esophogus of the  patient. Imaged were obtained with the patient in a left lateral decubitus position. Sedation performed by different physician. The patient was monitored while under deep sedation. Anesthestetic sedation was provided intravenously by Anesthesiology: 260mg  of Propofol, 40mg  of Lidocaine. Image quality was good. The patient's vital signs; including heart rate, blood pressure, and oxygen saturation; remained  stable throughout the procedure. The patient developed no complications during the procedure.  IMPRESSIONS  1. Left ventricular ejection fraction, by estimation, is 60 to 65%. The left ventricle has normal function. The left ventricle has no regional wall motion abnormalities.  2. Right ventricular systolic function is normal. The right ventricular size is normal.  3. No left atrial/left atrial appendage thrombus was detected.  4. Very thin filamentous strands seen extending from leaflet into left atrium (appears likely both posterior and anterior leaflets, though connection to anterior leaflet not seen). Redundant chordae noted. The mitral valve is myxomatous. Trivial mitral valve regurgitation. No evidence of mitral stenosis.  5. The tricuspid valve is myxomatous.  6. Very small, mobile filamentous echodensity present at the commisure. The aortic valve is tricuspid. Aortic valve regurgitation is trivial. No aortic stenosis is present.  7. Cannot exclude a small PFO. Agitated saline contrast bubble study was positive with shunting observed within 3-6 cardiac cycles suggestive of interatrial shunt.  8. 3D performed of the mitral valve and demonstrates Myxomatous structure. Conclusion(s)/Recommendation(s): No definitive source of cardiac embolism seen. Myxomatous appearance of mitral, aortic, and tricuspid valves as noted above. Very thin filaments noted in left atrial attached to mitral valve. Very minimal left to right intra-atrial shunt noted with bubble study. FINDINGS  Left Ventricle: Left ventricular ejection  fraction, by estimation, is 60 to 65%. The left ventricle has normal function. The left ventricle has no regional wall motion abnormalities. The left ventricular internal cavity size was normal in size. Right Ventricle: The right ventricular size is normal. No increase in right ventricular wall thickness. Right ventricular systolic function is normal. Left Atrium: Left atrial size was normal in size. No left atrial/left atrial appendage thrombus was detected. Right Atrium: Right atrial size was normal in size. Prominent Eustachian valve and Prominent Chiari network. Pericardium: Trivial pericardial effusion is present. Mitral Valve: Very thin filamentous strands seen extending from leaflet into left atrium (appears likely both posterior and anterior leaflets, though connection to anterior leaflet not seen). Redundant chordae noted. The mitral valve is myxomatous. Trivial mitral valve regurgitation. No evidence of mitral valve stenosis. There is no evidence of mitral valve vegetation. Tricuspid Valve: The tricuspid valve is myxomatous. Tricuspid valve regurgitation is trivial. There is no evidence of tricuspid valve vegetation. Aortic Valve: Very small, mobile filamentous echodensity present at the commisure. The aortic valve is tricuspid. Aortic valve regurgitation is trivial. No aortic stenosis is present. There is no evidence of aortic valve vegetation. Pulmonic Valve: The pulmonic valve was normal in structure. Pulmonic valve regurgitation is trivial. Aorta: The aortic root and ascending aorta are structurally normal, with no evidence of dilitation. IAS/Shunts: Cannot exclude a small PFO. Agitated saline contrast bubble study was positive with shunting observed within 3-6 cardiac cycles suggestive of interatrial shunt. Additional Comments: Spectral Doppler performed.  AORTA Ao Asc diam: 3.70 cm Jodelle Red MD Electronically signed by Jodelle Red MD Signature Date/Time: 01/06/2024/4:28:10 PM     Final    EP STUDY Result Date: 01/06/2024 See surgical note for result.  VAS [place] LOWER EXTREMITY VENOUS (DVT) Result Date: 01/05/2024  Lower Venous DVT Study Patient Name:  DANFORD TAT  Date of Exam:   01/05/2024 Medical Rec #: [id number]       Accession #:    [id number] Date of Birth: 03-27-1971        Patient Gender: M Patient Age:   1 years Exam Location:  [facility] Procedure:      VAS [place]  LOWER EXTREMITY VENOUS (DVT) Referring Phys: Leticia Penna --------------------------------------------------------------------------------  Indications: Stroke.  Risk Factors: None identified. Comparison Study: None. Performing Technologist: Shona Simpson  Examination Guidelines: A complete evaluation includes B-mode imaging, spectral Doppler, color Doppler, and power Doppler as needed of all accessible portions of each vessel. Bilateral testing is considered an integral part of a complete examination. Limited examinations for reoccurring indications may be performed as noted. The reflux portion of the exam is performed with the patient in reverse Trendelenburg.  +---------+---------------+---------+-----------+----------+--------------+ RIGHT    CompressibilityPhasicitySpontaneityPropertiesThrombus Aging +---------+---------------+---------+-----------+----------+--------------+ CFV      Full           Yes      Yes                                 +---------+---------------+---------+-----------+----------+--------------+ SFJ      Full                                                        +---------+---------------+---------+-----------+----------+--------------+ FV Prox  Full                                                        +---------+---------------+---------+-----------+----------+--------------+ FV Mid   Full                                                        +---------+---------------+---------+-----------+----------+--------------+ FV DistalFull                                                         +---------+---------------+---------+-----------+----------+--------------+ PFV      Full                                                        +---------+---------------+---------+-----------+----------+--------------+ POP      Full           Yes      Yes                                 +---------+---------------+---------+-----------+----------+--------------+ PTV      Full                                                        +---------+---------------+---------+-----------+----------+--------------+ PERO     Full                                                        +---------+---------------+---------+-----------+----------+--------------+   +---------+---------------+---------+-----------+----------+--------------+  LEFT     CompressibilityPhasicitySpontaneityPropertiesThrombus Aging +---------+---------------+---------+-----------+----------+--------------+ CFV      Full           Yes      Yes                                 +---------+---------------+---------+-----------+----------+--------------+ SFJ      Full                                                        +---------+---------------+---------+-----------+----------+--------------+ FV Prox  Full                                                        +---------+---------------+---------+-----------+----------+--------------+ FV Mid   Full                                                        +---------+---------------+---------+-----------+----------+--------------+ FV DistalFull                                                        +---------+---------------+---------+-----------+----------+--------------+ PFV      Full                                                        +---------+---------------+---------+-----------+----------+--------------+ POP      Full           Yes      Yes                                  +---------+---------------+---------+-----------+----------+--------------+ PTV      Full                                                        +---------+---------------+---------+-----------+----------+--------------+ PERO     Full                                                        +---------+---------------+---------+-----------+----------+--------------+     Summary: BILATERAL: - No evidence of deep vein thrombosis seen in the lower extremities, bilaterally. -No evidence of popliteal cyst, bilaterally.   *See table(s) above for measurements and observations. Electronically signed by Gerarda Fraction on 01/05/2024 at 3:41:11 PM.  Final    ECHOCARDIOGRAM COMPLETE Result Date: 01/04/2024    ECHOCARDIOGRAM REPORT   Patient Name:   WESLIE PRETLOW Date of Exam: 01/04/2024 Medical Rec #:  [id number]      Height:       63.0 in Accession #:    [id number]     Weight:       145.5 lb Date of Birth:  August 24, 1971       BSA:          1.689 m Patient Age:    52 years       BP:           145/66 mmHg Patient Gender: M              HR:           65 bpm. Exam Location:  Inpatient Procedure: 2D Echo, Color Doppler and Cardiac Doppler (Both Spectral and Color            Flow Doppler were utilized during procedure). Indications:    Stroke  History:        Patient has no prior history of Echocardiogram examinations.  Sonographer:    Rosaland Lao Sonographer#2:  Delcie Roch RDCS Referring Phys: [id number] TIMOTHY S OPYD IMPRESSIONS  1. Left ventricular ejection fraction, by estimation, is 60 to 65%. The left ventricle has normal function. The left ventricle has no regional wall motion abnormalities. Left ventricular diastolic parameters were normal.  2. Right ventricular systolic function is normal. The right ventricular size is normal.  3. The mitral valve is normal in structure. No evidence of mitral valve regurgitation. No evidence of mitral stenosis.  4. The aortic valve is normal in structure. Aortic valve  regurgitation is mild. No aortic stenosis is present.  5. The inferior vena cava is normal in size with greater than 50% respiratory variability, suggesting right atrial pressure of 3 mmHg. FINDINGS  Left Ventricle: Left ventricular ejection fraction, by estimation, is 60 to 65%. The left ventricle has normal function. The left ventricle has no regional wall motion abnormalities. Strain imaging was not performed. The left ventricular internal cavity  size was normal in size. There is no left ventricular hypertrophy. Left ventricular diastolic parameters were normal. Right Ventricle: The right ventricular size is normal. No increase in right ventricular wall thickness. Right ventricular systolic function is normal. Left Atrium: Left atrial size was normal in size. Right Atrium: Right atrial size was normal in size. Pericardium: There is no evidence of pericardial effusion. Mitral Valve: The mitral valve is normal in structure. No evidence of mitral valve regurgitation. No evidence of mitral valve stenosis. Tricuspid Valve: The tricuspid valve is normal in structure. Tricuspid valve regurgitation is not demonstrated. No evidence of tricuspid stenosis. Aortic Valve: The aortic valve is normal in structure. Aortic valve regurgitation is mild. No aortic stenosis is present. Pulmonic Valve: The pulmonic valve was normal in structure. Pulmonic valve regurgitation is not visualized. No evidence of pulmonic stenosis. Aorta: The aortic root is normal in size and structure. Venous: The inferior vena cava is normal in size with greater than 50% respiratory variability, suggesting right atrial pressure of 3 mmHg. IAS/Shunts: No atrial level shunt detected by color flow Doppler. Additional Comments: 3D imaging was not performed.  LEFT VENTRICLE PLAX 2D LVIDd:         5.00 cm   Diastology LVIDs:         3.10 cm   LV e' medial:    7.18 cm/s LV  PW:         0.80 cm   LV E/e' medial:  6.8 LV IVS:        1.00 cm   LV e' lateral:    10.10 cm/s LVOT diam:     1.90 cm   LV E/e' lateral: 4.8 LV SV:         54 LV SV Index:   32 LVOT Area:     2.84 cm  RIGHT VENTRICLE             IVC RV Basal diam:  3.90 cm     IVC diam: 1.60 cm RV Mid diam:    3.20 cm RV S prime:     12.10 cm/s TAPSE (M-mode): 2.5 cm LEFT ATRIUM             Index        RIGHT ATRIUM           Index LA diam:        3.10 cm 1.84 cm/m   RA Area:     17.50 cm LA Vol (A2C):   47.4 ml 28.06 ml/m  RA Volume:   47.80 ml  28.30 ml/m LA Vol (A4C):   36.9 ml 21.85 ml/m LA Biplane Vol: 42.3 ml 25.04 ml/m  AORTIC VALVE LVOT Vmax:   89.70 cm/s LVOT Vmean:  56.200 cm/s LVOT VTI:    0.189 m  AORTA Ao Root diam: 2.90 cm Ao Asc diam:  3.60 cm MITRAL VALVE MV Area (PHT): 2.29 cm    SHUNTS MV Decel Time: 331 msec    Systemic VTI:  0.19 m MV E velocity: 48.50 cm/s  Systemic Diam: 1.90 cm MV A velocity: 66.20 cm/s MV E/A ratio:  0.73 Mihai Croitoru MD Electronically signed by Thurmon Fair MD Signature Date/Time: 01/04/2024/3:59:51 PM    Final    CT ANGIO HEAD NECK W WO CM Result Date: 01/04/2024 CLINICAL DATA:  Stroke/TIA EXAM: CT ANGIOGRAPHY HEAD AND NECK WITH AND WITHOUT CONTRAST TECHNIQUE: Multidetector CT imaging of the head and neck was performed using the standard protocol during bolus administration of intravenous contrast. Multiplanar CT image reconstructions and MIPs were obtained to evaluate the vascular anatomy. Carotid stenosis measurements (when applicable) are obtained utilizing NASCET criteria, using the distal internal carotid diameter as the denominator. RADIATION DOSE REDUCTION: This exam was performed according to the departmental dose-optimization program which includes automated exposure control, adjustment of the mA and/or kV according to patient size and/or use of iterative reconstruction technique. CONTRAST:  75mL OMNIPAQUE IOHEXOL 350 MG/ML SOLN COMPARISON:  None Available. FINDINGS: CT HEAD FINDINGS Brain: No mass,hemorrhage or extra-axial collection. Normal  appearance of the parenchyma and CSF spaces. Vascular: No hyperdense vessel or unexpected vascular calcification. Skull: The visualized skull base, calvarium and extracranial soft tissues are normal. Sinuses/Orbits: No fluid levels or advanced mucosal thickening of the visualized paranasal sinuses. No mastoid or middle ear effusion. Normal orbits. CTA NECK FINDINGS Skeleton: No acute abnormality or high grade bony spinal canal stenosis. Other neck: Normal pharynx, larynx and major salivary glands. No cervical lymphadenopathy. Unremarkable thyroid gland. Upper chest: No pneumothorax or pleural effusion. No nodules or masses. Aortic arch: There is no calcific atherosclerosis of the aortic arch. Conventional 3 vessel aortic branching pattern. RIGHT carotid system: Normal without aneurysm, dissection or stenosis. LEFT carotid system: Normal without aneurysm, dissection or stenosis. Vertebral arteries: Right dominant configuration. There is no dissection, occlusion or flow-limiting stenosis to the skull base (V1-V3 segments). CTA HEAD FINDINGS  POSTERIOR CIRCULATION: The left vertebral artery terminates in PICA. There is severe stenosis of the right vertebral artery at the vertebrobasilar junction. Short segment occlusion at the origin of the right AICA. Basilar artery is normal. Superior cerebellar arteries are normal. Posterior cerebral arteries are normal. ANTERIOR CIRCULATION: Intracranial internal carotid arteries are normal. Anterior cerebral arteries are normal. 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 Mild stenosis of the right MCA M1 segment. Venous sinuses: As permitted by contrast timing, patent. Anatomic variants: Fetal origin of the left posterior cerebral artery. Review of the MIP images confirms the above findings. IMPRESSION: 1. 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 2. 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 3. 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 Electronically Signed   By: Deatra Robinson M.D.   On: 01/04/2024 01:46   MR Brain W and Wo Contrast Result Date: 01/03/2024 CLINICAL DATA:  Acute neurologic deficit.  Dizziness for 2 days. EXAM: MRI HEAD WITHOUT AND WITH CONTRAST TECHNIQUE: Multiplanar, multiecho pulse sequences of the brain and surrounding structures were obtained without and with intravenous contrast. CONTRAST:  7mL GADAVIST GADOBUTROL 1 MMOL/ML IV SOLN COMPARISON:  None Available. FINDINGS: Brain: There are multiple punctate foci of abnormal diffusion restriction within the right cerebellar hemisphere and right occipital lobe. No acute or chronic hemorrhage. Normal white matter signal, parenchymal volume and CSF spaces. The midline structures are normal. There is no abnormal contrast enhancement. Vascular: Normal flow voids. Skull and upper cervical spine: Normal calvarium and skull base. Visualized upper cervical spine and soft tissues are normal. Sinuses/Orbits:No paranasal sinus fluid levels or advanced mucosal thickening. No mastoid or middle ear effusion. Normal orbits. IMPRESSION: Multiple punctate acute infarcts within the right cerebellar hemisphere and right occipital lobe. No hemorrhage or mass effect. Electronically Signed   By: Deatra Robinson M.D.   On: 01/03/2024 22:33   DG Chest Port 1 View Result Date: 01/03/2024 CLINICAL DATA:  Sepsis. EXAM: PORTABLE CHEST 1 VIEW COMPARISON:  11/21/2021 FINDINGS: The heart size and mediastinal contours are within normal limits. Low lung volumes again noted. Both lungs are clear. The visualized skeletal structures are unremarkable. IMPRESSION: Low lung volumes. No active disease. Electronically Signed   By: Danae Orleans M.D.   On: 01/03/2024 13:28   Microbiology: Results for orders placed or performed during the hospital encounter of 01/03/24  Blood Culture (routine x 2)     Status: None (Preliminary result)   Collection Time: 01/03/24 12:30 PM   Specimen: BLOOD  Result Value  Ref Range Status   Specimen Description BLOOD RIGHT ANTECUBITAL  Final   Special Requests   Final    BOTTLES DRAWN AEROBIC AND ANAEROBIC Blood Culture results may not be optimal due to an inadequate volume of blood received in culture bottles   Culture   Final    NO GROWTH 3 DAYS Performed at [facility], [address] [place], [place] [address]    Report Status PENDING  Incomplete  Blood Culture (routine x 2)     Status: None (Preliminary result)   Collection Time: 01/03/24 12:35 PM   Specimen: BLOOD RIGHT HAND  Result Value Ref Range Status   Specimen Description BLOOD RIGHT HAND  Final   Special Requests   Final    BOTTLES DRAWN AEROBIC AND ANAEROBIC Blood Culture results may not be optimal due to an inadequate volume of blood received in culture bottles   Culture  Final    NO GROWTH 3 DAYS Performed at [facility], [address], [place], [place] [address]    Report Status PENDING  Incomplete  Resp panel by RT-PCR (RSV, Flu A&B, Covid) Anterior Nasal Swab     Status: None   Collection Time: 01/03/24 12:49 PM   Specimen: Anterior Nasal Swab  Result Value Ref Range Status   SARS Coronavirus 2 by RT PCR NEGATIVE NEGATIVE Final   Influenza A by PCR NEGATIVE NEGATIVE Final   Influenza B by PCR NEGATIVE NEGATIVE Final    Comment: (NOTE) The Xpert Xpress SARS-CoV-2/FLU/RSV plus assay is intended as an aid in the diagnosis of influenza from Nasopharyngeal swab specimens and should not be used as a sole basis for treatment. Nasal washings and aspirates are unacceptable for Xpert Xpress SARS-CoV-2/FLU/RSV testing.  Fact Sheet for Patients: BloggerCourse.com  Fact Sheet for Healthcare Providers: SeriousBroker.it  This test is not yet approved or cleared by the [place] FDA and has been authorized for detection and/or diagnosis of SARS-CoV-2 by FDA under an Emergency Use Authorization (EUA). This EUA will  remain in effect (meaning this test can be used) for the duration of the COVID-19 declaration under Section 564(b)(1) of the Act, 21 U.S.C. section 360bbb-3(b)(1), unless the authorization is terminated or revoked.     Resp Syncytial Virus by PCR NEGATIVE NEGATIVE Final    Comment: (NOTE) Fact Sheet for Patients: BloggerCourse.com  Fact Sheet for Healthcare Providers: SeriousBroker.it  This test is not yet approved or cleared by the [place] FDA and has been authorized for detection and/or diagnosis of SARS-CoV-2 by FDA under an Emergency Use Authorization (EUA). This EUA will remain in effect (meaning this test can be used) for the duration of the COVID-19 declaration under Section 564(b)(1) of the Act, 21 U.S.C. section 360bbb-3(b)(1), unless the authorization is terminated or revoked.  Performed at [facility], [address] [place], [place] [address]   Surgical PCR screen     Status: None   Collection Time: 01/06/24 12:37 AM   Specimen: Nasal Mucosa; Nasal Swab  Result Value Ref Range Status   MRSA, PCR NEGATIVE NEGATIVE Final   Staphylococcus aureus NEGATIVE NEGATIVE Final    Comment: (NOTE) The Xpert SA Assay (FDA approved for NASAL specimens in patients 65 years of age and older), is one component of a comprehensive surveillance program. It is not intended to diagnose infection nor to guide or monitor treatment. Performed at [facility], [address], [place], [place] [address]    Labs: CBC: Recent Labs  Lab 01/03/24 1242 01/04/24 0419 01/05/24 0649 01/06/24 1350  WBC 11.3* 9.0 6.8 7.2  NEUTROABS 9.0*  --  3.2 3.7  HGB 16.1 14.9 14.8 15.9  HCT 49.8 46.2 44.6 49.2  MCV 84.0 83.7 83.7 84.0  PLT 151 158 153 165   Basic Metabolic Panel: Recent Labs  Lab 01/03/24 1242 01/04/24 0419 01/05/24 0649 01/06/24 1350  NA 136 137 138 138  K 4.7 3.5 3.7 3.8  CL 103 106 108 106  CO2 22 22  23  20*  GLUCOSE 154* 95 91 155*  BUN 15 15 14 19   CREATININE 1.34* 1.22 1.24 1.23  CALCIUM 9.3 8.7* 8.9 9.1  MG  --   --  1.8 2.1  PHOS  --   --  2.5 3.3   Liver Function Tests: Recent Labs  Lab 01/03/24 1242 01/05/24 0649 01/06/24 1350  AST 31 21 25   ALT 23  17 19  ALKPHOS 69 53 60  BILITOT 0.6 1.0 0.4  PROT 7.8 6.6 7.4  ALBUMIN 4.0 3.2* 3.6   CBG: Recent Labs  Lab 01/03/24 1244  GLUCAP 163*   Discharge time spent: greater than 30 minutes.  Signed: Marguerita Merles, DO [facility] 01/06/2024

## 2024-01-06 NOTE — Interval H&P Note (Signed)
 History and Physical Interval Note:  01/06/2024 9:32 AM  Melvin Mills  has presented today for surgery, with the diagnosis of stroke.  The various methods of treatment have been discussed with the patient and family. After consideration of risks, benefits and other options for treatment, the patient has consented to  Procedure(s): TRANSESOPHAGEAL ECHOCARDIOGRAM (N/A) as a surgical intervention.  The patient's history has been reviewed, patient examined, no change in status, stable for surgery.  I have reviewed the patient's chart and labs.  Questions were answered to the patient's satisfaction.     Jansen Goodpasture Cristal Deer

## 2024-01-06 NOTE — Plan of Care (Signed)

## 2024-01-06 NOTE — Progress Notes (Signed)
  Echocardiogram Echocardiogram Transesophageal has been performed.  Melvin Mills 01/06/2024, 10:34 AM

## 2024-01-06 NOTE — Progress Notes (Signed)
 SLP Cancellation Note  Patient Details Name: Levaughn Puccinelli MRN: [id number] DOB: [birth year]   Cancelled treatment:       Reason Eval/Treat Not Completed: Patient at procedure or test/unavailable (TEE). SLP will continue following.    Gwynneth Aliment, M.A., CF-SLP Speech Language Pathology, Acute Rehabilitation Services  Secure Chat preferred [phone]  01/06/2024, 9:23 AM

## 2024-01-06 NOTE — Progress Notes (Signed)
 Reviewed AVS, patient expressed understanding of medications, MD follow up reviewed.   Removed IV, Site clean, dry and intact.  Patient states all belongings brought to the hospital at time of admission are accounted for and packed to take home.  Picked up medications from [facility] pharmacy. Pt transported to entrance A where family member was waiting in vehicle to transport home.

## 2024-01-06 NOTE — Progress Notes (Signed)
 Patient A/Ox4 and stable at time of return from TEE.

## 2024-01-07 ENCOUNTER — Telehealth: Payer: Self-pay

## 2024-01-07 LAB — PROTEIN C, TOTAL: Protein C, Total: 84 % (ref 60–150)

## 2024-01-07 LAB — HOMOCYSTEINE: Homocysteine: 13.4 umol/L (ref 0.0–14.5)

## 2024-01-07 LAB — HIGH SENSITIVITY CRP: CRP, High Sensitivity: 2 mg/L (ref 0.00–3.00)

## 2024-01-07 NOTE — Transitions of Care (Post Inpatient/ED Visit) (Signed)
   01/07/2024  Name: Melvin Mills MRN: [id number] DOB: [birth year]  Today's TOC FU Call Status: Today's TOC FU Call Status:: Successful TOC FU Call Completed TOC FU Call Complete Date: 01/07/24 Patient's Name and Date of Birth confirmed.  Transition Care Management Follow-up Telephone Call Date of Discharge: 01/06/24 Discharge Facility: [facility]) Type of Discharge: Inpatient Admission Primary Inpatient Discharge Diagnosis:: ischemic stroke How have you been since you were released from the hospital?: Better Any questions or concerns?: Yes Patient Questions/Concerns:: His only questions was scheduling an appointment with a provider.  He was last seen by Gwinda Passe, NP 04/2019. Patient Questions/Concerns Addressed: Other: (I scheduled his follow up appointment with Gwinda Passe, NP at [facility].  He stated he has the address and phone number for that clinic)  Items Reviewed: Did you receive and understand the discharge instructions provided?: Yes Medications obtained,verified, and reconciled?: Partial Review Completed Reason for Partial Mediation Review: He said he has all of his medications and he did not have any questions about the med regime and did not need to review the list Any new allergies since your discharge?: No Dietary orders reviewed?: Yes Type of Diet Ordered:: heart healthy, low sodium Do you have support at home?: Yes People in Home: spouse Name of Support/Comfort Primary Source: He stated he lives with his wife and his children  Medications Reviewed Today: Medications Reviewed Today   Medications were not reviewed in this encounter     Home Care and Equipment/Supplies: Were Home Health Services Ordered?: No Any new equipment or medical supplies ordered?: No  Functional Questionnaire: Do you need assistance with bathing/showering or dressing?: No Do you need assistance with meal preparation?: No Do you need assistance with eating?: No Do you have  difficulty maintaining continence: No Do you need assistance with getting out of bed/getting out of a chair/moving?: No Do you have difficulty managing or taking your medications?: No  Follow up appointments reviewed: PCP Follow-up appointment confirmed?: Yes Date of PCP follow-up appointment?: 01/14/24 Follow-up Provider: Efrain Sella, NP Specialist Hospital Follow-up appointment confirmed?: Yes Date of Specialist follow-up appointment?: 01/15/24 Follow-Up Specialty Provider:: cardiology;  referral has been placed to Neurology Do you need transportation to your follow-up appointment?: No (He said he will ask someone to take him to the appt.) Do you understand care options if your condition(s) worsen?: Yes-patient verbalized understanding    SIGNATURE Robyne Peers, RN

## 2024-01-08 LAB — CULTURE, BLOOD (ROUTINE X 2)
Culture: NO GROWTH
Culture: NO GROWTH

## 2024-01-09 LAB — PROTHROMBIN GENE MUTATION

## 2024-01-09 LAB — FACTOR 5 LEIDEN

## 2024-01-14 ENCOUNTER — Ambulatory Visit (INDEPENDENT_AMBULATORY_CARE_PROVIDER_SITE_OTHER): Payer: BC Managed Care – PPO | Admitting: Primary Care

## 2024-01-14 VITALS — BP 134/86 | HR 63 | Temp 98.0°F | Resp 12 | Ht 68.0 in | Wt 174.4 lb

## 2024-01-14 DIAGNOSIS — I639 Cerebral infarction, unspecified: Secondary | ICD-10-CM | POA: Diagnosis not present

## 2024-01-14 DIAGNOSIS — Z7689 Persons encountering health services in other specified circumstances: Secondary | ICD-10-CM

## 2024-01-14 DIAGNOSIS — R7303 Prediabetes: Secondary | ICD-10-CM

## 2024-01-14 DIAGNOSIS — Z8679 Personal history of other diseases of the circulatory system: Secondary | ICD-10-CM | POA: Diagnosis not present

## 2024-01-14 DIAGNOSIS — Z09 Encounter for follow-up examination after completed treatment for conditions other than malignant neoplasm: Secondary | ICD-10-CM

## 2024-01-14 NOTE — Progress Notes (Unsigned)
 Subjective:   Melvin Mills is a 53 y.o. [name] male presents for hospital follow up and establish care.  Patient has given his daughter permission to be present at his appointment and interpret [place] . He presented to the emergency room with  presents with dizziness, nausea, and vomiting.  He was admit date to the hospital was 01/03/24, patient was discharged from the hospital on 01/06/24, patient was admitted for:   Ischemic stroke, Lactic acidosis, Headache, Dizziness, Renal insufficiency, Hyperglycemia,  Leukocytosis, HLD (hyperlipidemia), Hypoalbuminemia and Cerebrovascular accident. Patient has No headache, No chest pain, No abdominal pain - No Nausea, No new weakness tingling or numbness, No Cough - shortness of breath  Past Medical History:  Diagnosis Date   Acute respiratory disease due to COVID-19 virus 04/08/2019   Cerebrovascular accident (CVA) (HCC) 01/06/2024   COVID-19 virus infection 04/08/2019   Dizziness 01/05/2024   Headache 01/05/2024   History of atrial fibrillation 01/05/2024   HLD (hyperlipidemia) 01/05/2024   Hyperglycemia 01/05/2024   Hypoalbuminemia 01/05/2024   Ischemic stroke (HCC) 01/03/2024   Lactic acidosis 01/03/2024   Leukocytosis 01/05/2024   Renal insufficiency 01/05/2024     No Known Allergies  Current Outpatient Medications on File Prior to Visit  Medication Sig Dispense Refill   acetaminophen (TYLENOL) 325 MG tablet Take 2 tablets (650 mg total) by mouth every 4 (four) hours as needed for mild pain (pain score 1-3) (or temp > 37.5 C (99.5 F)). 20 tablet 0   aspirin EC 81 MG tablet Take 1 tablet (81 mg total) by mouth daily. Swallow whole. 30 tablet 12   atorvastatin (LIPITOR) 80 MG tablet Take 1 tablet (80 mg total) by mouth at bedtime. 30 tablet 0   clopidogrel (PLAVIX) 75 MG tablet Take 1 tablet (75 mg total) by mouth daily. 30 tablet 2   meclizine (ANTIVERT) 25 MG tablet Take 1 tablet (25 mg total) by mouth 3 (three) times daily as needed for  dizziness. 30 tablet 0   senna-docusate (SENOKOT-S) 8.6-50 MG tablet Take 1 tablet by mouth at bedtime as needed for mild constipation. 30 tablet 0   No current facility-administered medications on file prior to visit.    Review of System: ROS Comprehensive ROS Pertinent positive and negative noted in HPI   Objective:  BP 134/86 (Cuff Size: Normal)   Pulse 63   Temp 98 F (36.7 C) (Oral)   Resp 12   Ht 5\' 8"  (1.727 m)   Wt 174 lb 6.4 oz (79.1 kg)   SpO2 100%   BMI 26.52 kg/m   Filed Weights   01/14/24 1448  Weight: 174 lb 6.4 oz (79.1 kg)    Physical Exam Vitals reviewed.  Constitutional:      Appearance: He is obese. He is ill-appearing.  HENT:     Head: Normocephalic.     Right Ear: Tympanic membrane and external ear normal.     Left Ear: Tympanic membrane and external ear normal.     Nose: Nose normal.  Eyes:     Extraocular Movements: Extraocular movements intact.     Pupils: Pupils are equal, round, and reactive to light.  Cardiovascular:     Rate and Rhythm: Normal rate and regular rhythm.  Pulmonary:     Effort: Pulmonary effort is normal.     Breath sounds: Normal breath sounds.  Abdominal:     General: Bowel sounds are normal. There is distension.     Palpations: Abdomen is soft.  Musculoskeletal:  General: Normal range of motion.     Cervical back: Normal range of motion.  Skin:    General: Skin is warm and dry.  Neurological:     Mental Status: He is oriented to person, place, and time.  Psychiatric:        Mood and Affect: Mood normal.        Behavior: Behavior normal.        Thought Content: Thought content normal.        Judgment: Judgment normal.      Assessment:  Melvin was seen today for hospitalization follow-up.  Diagnoses and all orders for this visit:  Encounter to establish care  Prediabetes Prediabetes is 5.7-6.4 monitor carbohydrates -rice, potatoes, tortillas, breads, pasta, sweets, sodas.  Increase exercising to  help maintain appropriate weight.   Hospital discharge follow-up See HPI -     CBC with Differential -     CMP14+EGFR -     Magnesium, RBC -     Phosphorus  Ischemic stroke [facility]) -     Ambulatory referral to Neurology  History of atrial fibrillation -     Ambulatory referral to Cardiology     This note has been created with Dragon speech recognition software and smart Lobbyist. Any transcriptional errors are unintentional.   Return in about 6 months (around 07/16/2024) for medical conditions.  Grayce Sessions, NP 01/15/2024, 8:57 AM

## 2024-01-14 NOTE — Patient Instructions (Signed)
 ??t qu? do thi?u mu no c?c b? Ischemic Stroke  ??t qu? do thi?u mu no c?c b? x?y ra khi m?t vng no khng nh?n ???c ?? l?u l??ng mu. ??t qu? cn ???c g?i l tai bi?n m?ch mu no (CVA). M?t l?u l??ng mu d?n ??n m no b? ch?t ??t ng?t v c th? gy t?n th??ng no. ??t qu? do thi?u mu no c?c b? l tr??ng h?p c?p c?u n?i khoa ph?i ???c ?i?u tr? ngay l?p t?c. C nh?ng nguyn nhn g? Tnh tr?ng ny l do dng mu ??n m?t ph?n no b? gi?m. Tnh tr?ng ny c th? do: C?c mu ?ng nh? (c?c ngh?n). Tch t? m?ng bm trong cc m?ch mu (x? v?a ??ng m?ch). Tnh tr?ng ny ch?n do?ng mu trong no. Nh?p tim b?t th??ng, ???c g?i l rung nh? (AFib). Tnh tr?ng ny s? ??a c?c mu ?ng nh? ??n no. ??ng m?ch ? ??u ho?c ? c? b? t?c ho?c b? t?n th??ng. M?t s? b?nh nhi?m trng nh?t ??nh. Vim cc ??ng m?ch trong no (vim m?ch). ?i khi, khng r nguyn nhn gy ??t qu? do thi?u mu no c?c b?. ?i?u g lm t?ng nguy c?? Nh??ng tnh tr?ng b?nh l sau co? th? la?m t?ng nguy c? bi? ??t qu?: Huy?t p cao (t?ng huy?t p). B?nh tim. Ti?u ???ng. Cholesterol cao. Bo ph. Cc v?n ?? v?i gi?c ng? (ng?ng th? khi ng?). Cc y?u t? nguy c? khc m qu v? c th? thay ??i bao g?m: S? d?ng cc s?n ph?m c nicotine ho?c thu?c l. Khng ho?t ??ng. S? d?ng nhi?u r??u ho?c ma ty, ??c bi?t l cocaine v methamphetamine. S? d?ng thu?c trnh [place], ??c bi?t l n?u qu v? c?ng s? d?ng thu?c l. Cc y?u t? nguy c? m qu v? khng th? thay ??i bao g?m: Trn 60 tu?i. C ti?n s? b? c?c mu ?ng, ??t qu?, ho?c ??t qu? nh? (c?n thi?u mu no c?c b? thong qua, TIA). C ti?n s? gia ?nh b? ??t qu?. C cc d?u hi?u ho?c tri?u ch?ng g? Nh?ng tri?u ch?ng c?a tnh tr?ng ny th??ng pht sinh ??t ng?t, ho?c quy? vi? c th? nh?n th?y cc tri?u ch?ng ? sau khi ngu? d?y. Nh?ng tri?u ch?ng ny c th? bao g?m: Y?u ho?c t b ? m?t, cnh tay, ho?c ? chn, ??c bi?t l ? m?t bn c?a c? th?. M?t th?ng b?ng ho?c m?t ?i?u ph?i. Ni  l?p, kh ni, kh hi?u l?i ni ho?c k?t h?p cc tri?u ch?ng ny. Thay ??i th? l?c. Qu v? c th? b? nhn ?i, m? m?t ho?c m?t th? l?c. Chng m?t ho?c l l?n. Bu?n nn v nn. ?au ??u d? d?i. N?u c th?, hy ghi l?i th?i gian chnh xc m cc tri?u ch?ng c?a qu v? kh?i pht. Hy ni cho chuyn gia ch?m [place] s?c kh?e c?a qu v? bi?t. N?u cc tri?u ch?ng x?y ra v h?t, th ? c th? l d?u hi?u c?a TIA (c?n thi?u mu no c?c b? thong qua). Hy yu c?u gip ?? ngay l?p t?c, cho d qu v? c?m th?y ?? h?n. Ch?n ?on tnh tr?ng ny nh? th? no? Tnh tr?ng ny c th? ???c ch?n ?on d?a vo: Cc tri?u ch?ng, b?nh s? c?a qu v? v khm th?c th?. Ch?p CT (ch?p c?t l?p) no. Ch?p MRI (ch?p c?ng h??ng t?). Cc ki?m tra ch?n ?on hnh ?nh ch?p l?u l??ng mu trong no (ch?p CT m?ch, ch?p MRI m?ch, ho?c ch?p  m?ch no). Qu v? c?ng c th? ph?i lm cc ki?m tra khc, bao g?m: ?i?n tm ?? (ECG). Theo di tim lin t?c. Siu m tim qua thnh ng?c (TTE). Siu m tim qua th?c qu?n (TEE). Siu m ??ng m?ch c?nh. Xt nghi?m mu. Theo di khi ng? ?? ki?m tra xem c ng?ng th? khi ng? khng. Qu v? c th? c?n ??n g?p chuyn gia ch?m [place] s?c kh?e chuyn v? ch?m [place] ??t qu?Marland Kitchen Tnh tr?ng ny ???c ?i?u tr? nh? th? no? Tnh tr?ng ny l tr??ng h?p c?p c?u. M?t ?i?u r?t quan tr?ng la? pha?i ????c ?i?u tr? ngay khi co? bi?u hi?u ??u tin c?a ca?c tri?u ch?ng ??t qu?. Ph??ng php ?i?u tr? cho quy? vi? s? ph? thu?c vo l???ng th??i gian k? t?? khi quy? vi? c tri?u ch?ng, m?c ?? n?ng c?a ca?c tri?u ch?ng v nguyn nhn gy ra cc tri?u ch?ng. ?i?u tr? c th? bao g?m: Thu?c ???c tim ?? lm tan c?c mu ?ng. Cc bi?n php ?i?u tr? ???c ti?n hnh tr?c ti?p ln ??ng m?ch b? ?nh h??ng ?? lo?i b? ho?c lm tan c?c mu ?ng. Thu?c ki?m sot huy?t p. Thu?c lm long mu (thu?c ch?ng ?ng ma?u ho?c ho?c thu?c ch?ng k?t t?p ti?u c?u). Nh?ng ph??ng php ?i?u tr? khc c th? bao g?m: Oxy. D?ch truy?n t?nh m?ch (IV). Th? thu?t  lm t?ng l?u l??ng mu. Nh??ng ph??ng php ?i?u tr? na?y c th? khng hi?u qu? n?u th?i gian k? t? khi b?t ??u co? cc tri?u ch?ng c?a ??t qu? ?a? qua? da?i. Ngay c? khi qu v? khng bi?t cc tri?u ch?ng b?t ??u khi no, hy ?i?u tr? cng s?m cng t?t. Sau khi b? ??t qu?, qu v? c th? lm vi?c v?i cc chuyn gia v?t l tr? li?u, chuyn gia li?u php ngn ng?, s?c kh?e tm th?n, ho?c chuyn gia li?u php lao ??ng ?? gip qu v? h?i ph?c. Tun th? nh?ng h??ng d?n ny ? nh: Thu?c Ch? s? d?ng thu?c khng k ??n v thu?c k ??n theo ch? d?n c?a chuyn gia ch?m [place] s?c kh?e. N?u qu v? ???c t? v?n dng m?t lo?i thu?c lm long mu, hy dng thu?c ?ng theo ch? d?n, vo cng m?t th?i ?i?m m?i ngy. Dng qu nhi?u thu?c lm long mu c th? gy ch?y mu. Dng qu t c th? khng b?o v? ???c qu v? kh?i ??t qu? v cc v?n ?? khc. Hy trao ??i v?i chuyn gia ch?m [place] s?c kh?e tr??c khi dng cc lo?i thu?c c aspirin ho?c thu?c ch?ng vim khng steroid (NSAID), nh? l ibuprofen. Nh?ng lo?i thu?c ny lm t?ng nguy c? b? ch?y mu nguy hi?m. Khi dng thu?c lm long mu, hy th?c hi?n nh?ng vi?c sau: p ln b?t k? v?t c?t no ch?y mu lu h?n bnh th??ng. Ni cho nha s? v cc chuyn gia ch?m [place] s?c kh?e khc bi?t r??ng quy? vi? ?ang dng thu?c ch?ng ?ng mu tr??c khi th?c hi?n b?t k? th? thu?t na?o co? th? gy ch?y mu. Trnh cc ho?t ??ng c th? gy th??ng t?n ho?c b?m tm. ?eo vng c?nh bo y t? ho?c m?t t?m th? li?t k cc lo?i thu?c m qu v? dng. ?n v u?ng Tun th? ch? d?n c?a chuyn gia ch?m [place] s?c kh?e v? ch? ?? ?n. ?n th?c ?n c l?i cho s?c kh?e. N?u ??t qu? ?nh h??ng ??n kh? n?ng nu?t, qu v? c th? c?n th?c hi?n cc b??c ??  trnh b? ngh?t s?c. Nh?ng b??c ny c th? bao g?m: ?n nh?ng mi?ng th?c ?n nh?. ?n cc th?c ?n m?m ho?c ???c nghi?n nh?Marland Kitchen [employer] ch? d?n c?a nhm chuyn gia ch?m [place] s?c kh?e c?a qu v? v? ho?t ??ng th? ch?t. S? d?ng khung t?p ?i ho?c g?y ch?ng theo ch?  d?n c?a chuyn gia ch?m [place] s?c kh?e. Th?c hi?n cc bi?n php ?? gi?m nguy c? t ng ? nh. Nh?ng bi?n php ny c th? bao g?m: L?p cc thanh v?n trong phng ng? v phng t?m. S? d?ng b?n c?u ???c nng cao v ??t gh? ng?i trong phng t?m. Lo?i b? cc ?? l?n x?n v nh??ng th?? gy nguy c? tr???t nga?, ch?ng h?n nh? dy d? ho?c th?m ? cc khu v?c. H??ng d?n chung Khng s? d?ng b?t k? s?n ph?m no c nicotine ho?c thu?c l. Nh?ng s?n ph?m ny bao g?m thu?c l d?ng ht, thu?c l d?ng nhai v d?ng c? ht thu?c, ch?ng h?n nh? thu?c l ?i?n t?. N?u qu v? c?n gip ?? ?? cai thu?c, hy h?i chuyn gia ch?m [place] s?c kh?e. N?u qu v? u?ng r??u: Gi?i h?n l??ng r??u qu v? u?ng ? m?c: 0-1 ly/ngy ??i v?i ph? n? khng mang thai. 0-2 ly/ngy ??i v?i nam gi?i. Bi?t m?t ly c bao nhiu r??u. ? M?, m?t ly t??ng ???ng v?i m?t chai bia 12 ao x? (355 mL), m?t ly r??u vang 5 ao x? (148 mL), ho?c m?t ly r??u m?nh 1 ao x? (44 mL). Tun th? theo t?t c? cc l?n khm l?i. ?i?u ny c vai tr quan tr?ng. Ng?n ng?a tnh tr?ng ny nh? th? no? Qu v? c th? gi?m nguy c? b? m?t c?n ??t qu? khc b?ng cch ki?m sot nh?ng tnh tr?ng sau: Huy?t p cao. Cholesterol cao. Ti?u ???ng. B?nh tim. Ng?ng th? khi ng?. Bo ph. B? ht thu?c, h?n ch? u?ng r??u v duy tr ho?t ??ng th? ch?t c?ng s? lm gi?m nguy c?. Chuyn gia ch?m [place] s?c kh?e c?a quy? vi? s? ti?p t?c gip qu v? tm cch ng?n ng?a cc v?n ?? ng?n h?n v di h?n do ??t qu? gy ra. Hy lin l?c v?i chuyn gia ch?m [place] s?c kh?e n?u qu v?: Yu c?u tr? gip ngay l?p t?c n?u: Qu v? c b?t k? tri?u ch?ng ??t qu? no. "BE FAST" l cch d? dng ?? ghi nh? cc d?u hi?u chnh c?nh bo ??t qu?: B - Th?ng b?ng (Balance). Cc d?u hi?u l chng m?t, ??t ng?t ?i l?i kh kh?n ho?c m?t th?ng b?ng. E - M?t (Eyes). Cc d?u hi?u ny bao g?m nhn m? ho?c ??t ng?t thay ??i th? l?c. F - M?t (Face). Cc d?u hi?u l ??t ng?t y?u ho?c t ? m?t, ho?c m?t ho?c mi m?t x? xu?ng ? m?t bn. A -  Cnh tay (Arms). Cc d?u hi?u l y?u ho?c t ? m?t cnh tay. ?i?u ny x?y ra ??t ng?t v th??ng ? m?t bn c? th?. S - L?i ni (Speech). Cc d?u hi?u l ??t ng?t kh ni, ni ng?ng ho?c kh hi?u ng??i khc ni g. T - Th?i gian (Time). Th?i gian c?n g?i d?ch v? c?p c?u. Ghi l?i th?i gian cc tri?u ch?ng b?t ??u. Qu v? c cc d?u hi?u khc c?a ??t qu?, ch?ng h?n nh?: ?au ??u d? d?i, ??t ng?t khng r nguyn nhn. Bu?n nn ho?c nn. Co gi?t. Nh?ng tri?u ch?ng ny c th?  l tr??ng h?p c?p c?u. Yu c?u tr? gip ngay l?p t?c. Hy g?i 911. Khng ch? xem tri?u ch?ng c h?t khng. Khng t? li xe ??n b?nh vi?n. Tm t?t ??t qu? do thi?u mu no c?c b? x?y ra khi m?t vng no khng nh?n ???c ?? l?u l??ng mu. Nh?ng tri?u ch?ng c?a tnh tr?ng ny th??ng pht sinh ??t ng?t, ho?c quy? vi? c th? nh?n th?y cc tri?u ch?ng ? sau khi ngu? d?y. Tnh tr?ng ny l tr??ng h?p c?p c?u. M?t ?i?u r?t quan tr?ng la? pha?i ????c ?i?u tr? ngay khi co? bi?u hi?u ??u tin c?a ca?c tri?u ch?ng ??t qu?Marland Kitchen Thng tin ny khng nh?m m?c ?ch thay th? cho l?i khuyn m chuyn gia ch?m [place] s?c kh?e ni v?i qu v?. Hy b?o ??m qu v? ph?i th?o lu?n b?t k? v?n ?? g m qu v? c v?i chuyn gia ch?m [place] s?c kh?e c?a qu v?. Document Revised: 12/06/2021 Document Reviewed: 12/06/2021 [employer] Patient Education  2024 [employer].

## 2024-01-15 ENCOUNTER — Encounter (INDEPENDENT_AMBULATORY_CARE_PROVIDER_SITE_OTHER): Payer: Self-pay | Admitting: Primary Care

## 2024-01-15 ENCOUNTER — Ambulatory Visit (HOSPITAL_BASED_OUTPATIENT_CLINIC_OR_DEPARTMENT_OTHER): Payer: BC Managed Care – PPO | Admitting: Cardiology

## 2024-01-17 LAB — CMP14+EGFR
ALT: 33 IU/L (ref 0–44)
AST: 30 IU/L (ref 0–40)
Albumin: 4.5 g/dL (ref 3.8–4.9)
Alkaline Phosphatase: 106 IU/L (ref 44–121)
BUN/Creatinine Ratio: 17 (ref 9–20)
BUN: 20 mg/dL (ref 6–24)
Bilirubin Total: 0.3 mg/dL (ref 0.0–1.2)
CO2: 23 mmol/L (ref 20–29)
Calcium: 9.4 mg/dL (ref 8.7–10.2)
Chloride: 102 mmol/L (ref 96–106)
Creatinine, Ser: 1.18 mg/dL (ref 0.76–1.27)
Globulin, Total: 3.4 g/dL (ref 1.5–4.5)
Glucose: 81 mg/dL (ref 70–99)
Potassium: 4.2 mmol/L (ref 3.5–5.2)
Sodium: 139 mmol/L (ref 134–144)
Total Protein: 7.9 g/dL (ref 6.0–8.5)
eGFR: 73 mL/min/{1.73_m2} (ref >59)

## 2024-01-17 LAB — CBC WITH DIFFERENTIAL/PLATELET
Basophils Absolute: 0 10*3/uL (ref 0.0–0.2)
Basos: 1 %
EOS (ABSOLUTE): 0.9 10*3/uL — ABNORMAL HIGH (ref 0.0–0.4)
Eos: 11 %
Hematocrit: 51.3 % — ABNORMAL HIGH (ref 37.5–51.0)
Hemoglobin: 16.1 g/dL (ref 13.0–17.7)
Immature Grans (Abs): 0 10*3/uL (ref 0.0–0.1)
Immature Granulocytes: 0 %
Lymphocytes Absolute: 2.2 10*3/uL (ref 0.7–3.1)
Lymphs: 27 %
MCH: 26.8 pg (ref 26.6–33.0)
MCHC: 31.4 g/dL — ABNORMAL LOW (ref 31.5–35.7)
MCV: 85 fL (ref 79–97)
Monocytes Absolute: 0.6 10*3/uL (ref 0.1–0.9)
Monocytes: 8 %
Neutrophils Absolute: 4.3 10*3/uL (ref 1.4–7.0)
Neutrophils: 53 %
Platelets: 189 10*3/uL (ref 150–450)
RBC: 6.01 x10E6/uL — ABNORMAL HIGH (ref 4.14–5.80)
RDW: 13.1 % (ref 11.6–15.4)
WBC: 8 10*3/uL (ref 3.4–10.8)

## 2024-01-17 LAB — PHOSPHORUS: Phosphorus: 3.3 mg/dL (ref 2.8–4.1)

## 2024-01-17 LAB — MAGNESIUM, RBC: Magnesium RBC: 6 mg/dL (ref 3.7–7.0)

## 2024-07-16 ENCOUNTER — Ambulatory Visit (INDEPENDENT_AMBULATORY_CARE_PROVIDER_SITE_OTHER): Admitting: Primary Care

## 2024-08-20 DIAGNOSIS — Z8673 Personal history of transient ischemic attack (TIA), and cerebral infarction without residual deficits: Secondary | ICD-10-CM | POA: Diagnosis not present

## 2024-08-20 DIAGNOSIS — R42 Dizziness and giddiness: Secondary | ICD-10-CM | POA: Diagnosis not present

## 2024-08-20 DIAGNOSIS — I10 Essential (primary) hypertension: Secondary | ICD-10-CM | POA: Diagnosis not present

## 2024-08-20 DIAGNOSIS — E7800 Pure hypercholesterolemia, unspecified: Secondary | ICD-10-CM | POA: Diagnosis not present
